# Patient Record
Sex: Female | Born: 1965 | Race: White | Hispanic: No | Marital: Married | Smoking: Former smoker
Health system: Southern US, Community
[De-identification: ages and names within clinical notes are randomized; demographics above are authoritative.]

## PROBLEM LIST (undated history)

## (undated) DIAGNOSIS — J189 Pneumonia, unspecified organism: Secondary | ICD-10-CM

## (undated) DIAGNOSIS — Z862 Personal history of diseases of the blood and blood-forming organs and certain disorders involving the immune mechanism: Secondary | ICD-10-CM

## (undated) DIAGNOSIS — C541 Malignant neoplasm of endometrium: Secondary | ICD-10-CM

## (undated) HISTORY — PX: MOUTH SURGERY: SHX715

## (undated) HISTORY — PX: COLONOSCOPY: SHX174

---

## 2002-07-23 ENCOUNTER — Other Ambulatory Visit: Admission: RE | Admit: 2002-07-23 | Discharge: 2002-07-23 | Payer: Self-pay | Admitting: Obstetrics and Gynecology

## 2002-07-30 ENCOUNTER — Encounter: Admission: RE | Admit: 2002-07-30 | Discharge: 2002-07-30 | Payer: Self-pay | Admitting: Obstetrics and Gynecology

## 2002-07-30 ENCOUNTER — Encounter: Payer: Self-pay | Admitting: Obstetrics and Gynecology

## 2002-07-30 ENCOUNTER — Ambulatory Visit (HOSPITAL_COMMUNITY): Admission: RE | Admit: 2002-07-30 | Discharge: 2002-07-30 | Payer: Self-pay | Admitting: Obstetrics and Gynecology

## 2006-08-31 ENCOUNTER — Ambulatory Visit: Payer: Self-pay | Admitting: General Practice

## 2007-09-05 ENCOUNTER — Ambulatory Visit: Payer: Self-pay | Admitting: General Practice

## 2008-09-10 ENCOUNTER — Ambulatory Visit: Payer: Self-pay | Admitting: General Practice

## 2009-09-16 ENCOUNTER — Ambulatory Visit: Payer: Self-pay | Admitting: General Practice

## 2010-09-08 ENCOUNTER — Ambulatory Visit: Payer: Self-pay | Admitting: General Practice

## 2011-09-07 ENCOUNTER — Ambulatory Visit: Payer: Self-pay

## 2012-09-05 ENCOUNTER — Ambulatory Visit: Payer: Self-pay

## 2013-09-12 ENCOUNTER — Ambulatory Visit: Payer: Self-pay | Admitting: General Practice

## 2014-05-20 DIAGNOSIS — E6609 Other obesity due to excess calories: Secondary | ICD-10-CM | POA: Insufficient documentation

## 2014-05-20 DIAGNOSIS — Z0000 Encounter for general adult medical examination without abnormal findings: Secondary | ICD-10-CM | POA: Insufficient documentation

## 2014-05-20 DIAGNOSIS — Z6838 Body mass index (BMI) 38.0-38.9, adult: Secondary | ICD-10-CM

## 2014-05-20 DIAGNOSIS — L309 Dermatitis, unspecified: Secondary | ICD-10-CM | POA: Insufficient documentation

## 2015-08-13 ENCOUNTER — Other Ambulatory Visit: Payer: Self-pay | Admitting: Family Medicine

## 2015-08-13 ENCOUNTER — Other Ambulatory Visit: Payer: Self-pay

## 2015-08-14 LAB — CMP12+LP+TP+TSH+6AC+CBC/D/PLT
ALT: 11 IU/L (ref 0–32)
AST: 22 IU/L (ref 0–40)
Albumin/Globulin Ratio: 1.6 (ref 1.1–2.5)
Albumin: 4.5 g/dL (ref 3.5–5.5)
Alkaline Phosphatase: 57 IU/L (ref 39–117)
BUN/Creatinine Ratio: 15 (ref 9–23)
BUN: 11 mg/dL (ref 6–24)
Basophils Absolute: 0 10*3/uL (ref 0.0–0.2)
Basos: 1 %
Bilirubin Total: 0.7 mg/dL (ref 0.0–1.2)
CALCIUM: 9.7 mg/dL (ref 8.7–10.2)
CHOL/HDL RATIO: 3.2 ratio (ref 0.0–4.4)
CREATININE: 0.73 mg/dL (ref 0.57–1.00)
Chloride: 102 mmol/L (ref 97–108)
Cholesterol, Total: 190 mg/dL (ref 100–199)
EOS (ABSOLUTE): 0.1 10*3/uL (ref 0.0–0.4)
Eos: 1 %
Estimated CHD Risk: <0.5 times avg. (ref 0.0–1.0)
Free Thyroxine Index: 2.6 (ref 1.2–4.9)
GFR calc Af Amer: 110 mL/min/{1.73_m2} (ref >59)
GFR, EST NON AFRICAN AMERICAN: 96 mL/min/{1.73_m2} (ref >59)
GGT: 16 IU/L (ref 0–60)
GLOBULIN, TOTAL: 2.8 g/dL (ref 1.5–4.5)
Glucose: 94 mg/dL (ref 65–99)
HDL: 59 mg/dL (ref >39)
Hematocrit: 41.3 % (ref 34.0–46.6)
Hemoglobin: 13.8 g/dL (ref 11.1–15.9)
IRON: 80 ug/dL (ref 27–159)
Immature Grans (Abs): 0 10*3/uL (ref 0.0–0.1)
Immature Granulocytes: 0 %
LDH: 199 IU/L (ref 119–226)
LDL Calculated: 112 mg/dL — ABNORMAL HIGH (ref 0–99)
LYMPHS ABS: 2.5 10*3/uL (ref 0.7–3.1)
Lymphs: 47 %
MCH: 30.5 pg (ref 26.6–33.0)
MCHC: 33.4 g/dL (ref 31.5–35.7)
MCV: 91 fL (ref 79–97)
MONOS ABS: 0.4 10*3/uL (ref 0.1–0.9)
Monocytes: 7 %
NEUTROS ABS: 2.3 10*3/uL (ref 1.4–7.0)
Neutrophils: 44 %
PHOSPHORUS: 3.7 mg/dL (ref 2.5–4.5)
POTASSIUM: 4.1 mmol/L (ref 3.5–5.2)
Platelets: 192 10*3/uL (ref 150–379)
RBC: 4.52 x10E6/uL (ref 3.77–5.28)
RDW: 14.2 % (ref 12.3–15.4)
Sodium: 140 mmol/L (ref 134–144)
T3 Uptake Ratio: 27 % (ref 24–39)
T4 TOTAL: 9.8 ug/dL (ref 4.5–12.0)
TOTAL PROTEIN: 7.3 g/dL (ref 6.0–8.5)
TRIGLYCERIDES: 97 mg/dL (ref 0–149)
TSH: 0.55 u[IU]/mL (ref 0.450–4.500)
URIC ACID: 5.2 mg/dL (ref 2.5–7.1)
VLDL Cholesterol Cal: 19 mg/dL (ref 5–40)
WBC: 5.2 10*3/uL (ref 3.4–10.8)

## 2015-08-14 LAB — HGB A1C W/O EAG: HEMOGLOBIN A1C: 5.4 % (ref 4.8–5.6)

## 2015-08-26 ENCOUNTER — Other Ambulatory Visit: Payer: Self-pay | Admitting: Internal Medicine

## 2015-08-26 DIAGNOSIS — Z1231 Encounter for screening mammogram for malignant neoplasm of breast: Secondary | ICD-10-CM

## 2015-09-02 ENCOUNTER — Ambulatory Visit
Admission: RE | Admit: 2015-09-02 | Discharge: 2015-09-02 | Disposition: A | Discharge status: Home / Self Care | Payer: No Typology Code available for payment source | Source: Ambulatory Visit | Attending: Internal Medicine | Admitting: Internal Medicine

## 2015-09-02 DIAGNOSIS — Z1231 Encounter for screening mammogram for malignant neoplasm of breast: Secondary | ICD-10-CM | POA: Insufficient documentation

## 2016-04-28 ENCOUNTER — Other Ambulatory Visit: Payer: Self-pay | Admitting: Family Medicine

## 2016-04-29 LAB — CMP12+LP+TP+TSH+6AC+CBC/D/PLT
A/G RATIO: 1.7 (ref 1.2–2.2)
ALK PHOS: 63 IU/L (ref 39–117)
ALT: 10 IU/L (ref 0–32)
AST: 27 IU/L (ref 0–40)
Albumin: 4.5 g/dL (ref 3.5–5.5)
BASOS ABS: 0 10*3/uL (ref 0.0–0.2)
BILIRUBIN TOTAL: 0.9 mg/dL (ref 0.0–1.2)
BUN/Creatinine Ratio: 14 (ref 9–23)
BUN: 11 mg/dL (ref 6–24)
Basos: 0 %
CHOL/HDL RATIO: 3.6 ratio (ref 0.0–4.4)
CREATININE: 0.76 mg/dL (ref 0.57–1.00)
Calcium: 9.8 mg/dL (ref 8.7–10.2)
Chloride: 101 mmol/L (ref 96–106)
Cholesterol, Total: 228 mg/dL — ABNORMAL HIGH (ref 100–199)
EOS (ABSOLUTE): 0.1 10*3/uL (ref 0.0–0.4)
Eos: 2 %
Estimated CHD Risk: 0.6 times avg. (ref 0.0–1.0)
Free Thyroxine Index: 2.4 (ref 1.2–4.9)
GFR calc Af Amer: 104 mL/min/{1.73_m2} (ref >59)
GFR, EST NON AFRICAN AMERICAN: 90 mL/min/{1.73_m2} (ref >59)
GGT: 20 IU/L (ref 0–60)
GLUCOSE: 93 mg/dL (ref 65–99)
Globulin, Total: 2.7 g/dL (ref 1.5–4.5)
HDL: 63 mg/dL (ref >39)
HEMOGLOBIN: 14.2 g/dL (ref 11.1–15.9)
Hematocrit: 42.1 % (ref 34.0–46.6)
Immature Grans (Abs): 0 10*3/uL (ref 0.0–0.1)
Immature Granulocytes: 0 %
Iron: 99 ug/dL (ref 27–159)
LDH: 208 IU/L (ref 119–226)
LDL CALC: 147 mg/dL — AB (ref 0–99)
LYMPHS ABS: 2 10*3/uL (ref 0.7–3.1)
LYMPHS: 43 %
MCH: 30.8 pg (ref 26.6–33.0)
MCHC: 33.7 g/dL (ref 31.5–35.7)
MCV: 91 fL (ref 79–97)
MONOCYTES: 8 %
MONOS ABS: 0.3 10*3/uL (ref 0.1–0.9)
NEUTROS ABS: 2.1 10*3/uL (ref 1.4–7.0)
Neutrophils: 47 %
PHOSPHORUS: 3.6 mg/dL (ref 2.5–4.5)
POTASSIUM: 4.2 mmol/L (ref 3.5–5.2)
Platelets: 220 10*3/uL (ref 150–379)
RBC: 4.61 x10E6/uL (ref 3.77–5.28)
RDW: 13.7 % (ref 12.3–15.4)
Sodium: 140 mmol/L (ref 134–144)
T3 Uptake Ratio: 27 % (ref 24–39)
T4, Total: 8.8 ug/dL (ref 4.5–12.0)
TSH: 0.51 u[IU]/mL (ref 0.450–4.500)
Total Protein: 7.2 g/dL (ref 6.0–8.5)
Triglycerides: 91 mg/dL (ref 0–149)
URIC ACID: 5.5 mg/dL (ref 2.5–7.1)
VLDL CHOLESTEROL CAL: 18 mg/dL (ref 5–40)
WBC: 4.6 10*3/uL (ref 3.4–10.8)

## 2016-04-29 LAB — VITAMIN D 25 HYDROXY (VIT D DEFICIENCY, FRACTURES): VIT D 25 HYDROXY: 30.5 ng/mL (ref 30.0–100.0)

## 2016-04-29 LAB — HGB A1C W/O EAG: HEMOGLOBIN A1C: 5.2 % (ref 4.8–5.6)

## 2016-08-20 ENCOUNTER — Other Ambulatory Visit: Payer: Self-pay | Admitting: Internal Medicine

## 2016-08-20 DIAGNOSIS — Z1231 Encounter for screening mammogram for malignant neoplasm of breast: Secondary | ICD-10-CM

## 2016-09-06 ENCOUNTER — Ambulatory Visit
Admission: RE | Admit: 2016-09-06 | Discharge: 2016-09-06 | Disposition: A | Discharge status: Home / Self Care | Payer: No Typology Code available for payment source | Source: Ambulatory Visit | Attending: Internal Medicine | Admitting: Internal Medicine

## 2016-09-06 ENCOUNTER — Encounter: Payer: Self-pay | Admitting: Radiology

## 2016-09-06 DIAGNOSIS — Z1231 Encounter for screening mammogram for malignant neoplasm of breast: Secondary | ICD-10-CM | POA: Insufficient documentation

## 2017-03-17 ENCOUNTER — Ambulatory Visit: Payer: Self-pay | Admitting: Registered Nurse

## 2017-03-17 VITALS — BP 120/87 | HR 81 | Temp 97.8°F

## 2017-03-17 DIAGNOSIS — M7652 Patellar tendinitis, left knee: Principal | ICD-10-CM

## 2017-03-17 DIAGNOSIS — M7651 Patellar tendinitis, right knee: Secondary | ICD-10-CM

## 2017-03-17 NOTE — Patient Instructions (Signed)
New shoes Strengthening exercises Weight loss Motrin 800mg  by mouth every 8 hours x 2 weeks Ice therapy prior to bed 15 minutes biofreeze apply up to every 6 hours over knees as needed for pain   Patellar Tendinitis Rehab Ask your health care provider which exercises are safe for you. Do exercises exactly as told by your health care provider and adjust them as directed. It is normal to feel mild stretching, pulling, tightness, or discomfort as you do these exercises, but you should stop right away if you feel sudden pain or your pain gets worse.Do not begin these exercises until told by your health care provider. Stretching and range of motion exercises This exercise warms up your muscles and joints and improves the movement and flexibility of your knee. This exercise also helps to relieve pain and stiffness. Exercise A: Hamstring, doorway   1. Lie on your back in front of a doorway with your __________ leg resting against the wall and your other leg flat on the floor in the doorway. There should be a slight bend in your __________ knee. 2. Straighten your __________ knee. You should feel a stretch behind your knee or thigh. If you do not, scoot your buttocks closer to the door. 3. Hold this position for __________ seconds. Repeat __________ times. Complete this stretch __________ times a day. Strengthening exercises These exercises build strength and endurance in your knee. Endurance is the ability to use your muscles for a long time, even after they get tired. Exercise B: Quadriceps, isometric   1. Lie on your back with your __________ leg extended and your other knee bent. 2. Slowly tense the muscles in the front of your __________ thigh. When you do this, you should see your kneecap slide up toward your hip or see increased dimpling just above the knee. This motion will push the back of your knee toward the floor. If this is painful, try putting a rolled-up hand towel under your knee to  support it in a bent position. Change the size of the towel to find a position that allows you to do this exercise without any pain. 3. For __________ seconds, hold the muscle as tight as you can without increasing your pain. 4. Relax the muscles slowly and completely. Repeat __________ times. Complete this exercise __________ times a day. Exercise C: Straight leg raises (  quadriceps) 1. Lie on your back with your __________ leg extended and your other knee bent. 2. Tense the muscles in the front of your __________ thigh. When you do this, you should see your kneecap slide up or see increased dimpling just above the knee. 3. Keep these muscles tight as you raise your leg 4-6 inches (10-15 cm) off the floor. Do not let your moving knee bend. 4. Hold this position for __________ seconds. 5. Keep these muscles tense as you slowly lower your leg. 6. Relax your muscles slowly and completely. Repeat __________ times. Complete this exercise __________ times a day. Exercise D: Squats 1. Stand in front of a table, with your feet and knees pointing straight ahead. You may rest your hands on the table for balance but not for support. 2. Slowly bend your knees and lower your hips like you are going to sit in a chair.  Keep your weight over your heels, not over your toes.  Keep your lower legs upright so they are parallel with the table legs.  Do not let your hips go lower than your knees.  Do not  bend lower than told by your health care provider.  If your knee pain increases, do not bend as low. 3. Hold the squat position for __________ seconds. 4. Slowly push with your legs to return to standing. Do not use your hands to pull yourself to standing. Repeat __________ times. Complete this exercise __________ times a day. Exercise E: Step-downs 1. Stand on the edge of a step. 2. Keeping your weight over your __________ heel, slowly bend your __________ knee to bring your __________ heel toward the  floor. Lower your heel as far as you can while keeping control and without increasing any discomfort.  Do not let your __________ knee come forward.  Use your leg muscles, not gravity, to lower your body.  Hold a wall or rail for balance if needed. 3. Slowly push through your heel to lift your body weight back up. 4. Return to the starting position. Repeat __________ times. Complete this exercise __________ times a day. Exercise F: Straight leg raises ( hip abductors) 1. Lie on your side with your __________ leg in the top position. Lie so your head, shoulder, knee, and hip line up. You may bend your lower knee to help you keep your balance. 2. Roll your hips slightly forward, so that your hips are stacked directly over each other and your __________ knee is facing forward. 3. Leading with your heel, lift your top leg 4-6 inches (10-15 cm). You should feel the muscles in your outer hip lifting.  Do not let your foot drift forward.  Do not let your knee roll toward the ceiling. 4. Hold this position for __________ seconds. 5. Slowly lower your leg to the starting position. 6. Let your muscles relax completely after each repetition. Repeat __________ times. Complete this exercise __________ times a day. This information is not intended to replace advice given to you by your health care provider. Make sure you discuss any questions you have with your health care provider. Document Released: 11/01/2005 Document Revised: 07/08/2016 Document Reviewed: 08/05/2015 Elsevier Interactive Patient Education  2017 [employer]  Tendinitis Tendinitis is inflammation of a tendon. A tendon is a strong cord of tissue that connects muscle to bone. Tendinitis can affect any tendon, but it most commonly affects the shoulder tendon (rotator cuff), ankle tendon (Achilles tendon), elbow tendon (triceps tendon), or one of the tendons in the wrist. What are the causes? This condition may be caused  by:  Overusing a tendon or muscle. This is common.  Age-related wear and tear.  Injury.  Inflammatory conditions, such as arthritis.  Certain medicines. What increases the risk? This condition is more likely to develop in people who do activities that involve repetitive motions. What are the signs or symptoms? Symptoms of this condition may include:  Pain.  Tenderness.  Mild swelling. How is this diagnosed? This condition is diagnosed with a physical exam. You may also have tests, such as:  Ultrasound. This uses sound waves to make an image of your affected area.  MRI. How is this treated? This condition may be treated by resting, icing, applying pressure (compression), and raising (elevating) the area above the level of your heart. This is known as RICE therapy. Treatment may also include:  Medicines to help reduce inflammation or to help reduce pain.  Exercises or physical therapy to strengthen and stretch the tendon.  A brace or splint.  Surgery (rare). Follow these instructions at home:   If you have a splint or brace:   Wear the  splint or brace as told by your health care provider. Remove it only as told by your health care provider.  Loosen the splint or brace if your fingers or toes tingle, become numb, or turn cold and blue.  Do not take baths, swim, or use a hot tub until your health care provider approves. Ask your health care provider if you can take showers. You may only be allowed to take sponge baths for bathing.  Do not let your splint or brace get wet if it is not waterproof.  If your splint or brace is not waterproof, cover it with a watertight plastic bag when you take a bath or a shower.  Keep the splint or brace clean. Managing pain, stiffness, and swelling   If directed, apply ice to the affected area.  Put ice in a plastic bag.  Place a towel between your skin and the bag.  Leave the ice on for 20 minutes, 2-3 times a day.  If  directed, apply heat to the affected area as often as told by your health care provider. Use the heat source that your health care provider recommends, such as a moist heat pack or a heating pad.  Place a towel between your skin and the heat source.  Leave the heat on for 20-30 minutes.  Remove the heat if your skin turns bright red. This is especially important if you are unable to feel pain, heat, or cold. You may have a greater risk of getting burned.  Move the fingers or toes of the affected limb often, if this applies. This can help to prevent stiffness and lessen swelling.  If directed, elevate the affected area above the level of your heart while you are sitting or lying down. Driving   Do not drive or operate heavy machinery while taking prescription pain medicine.  Ask your health care provider when it is safe to drive if you have a splint or brace on any part of your arm or leg. Activity   Return to your normal activities as told by your health care provider. Ask your health care provider what activities are safe for you.  Rest the affected area as told by your health care provider.  Avoid using the affected area while you are experiencing symptoms of tendinitis.  Do exercises as told by your health care provider. General instructions   If you have a splint, do not put pressure on any part of the splint until it is fully hardened. This may take several hours.  Wear an elastic bandage or compression wrap only as told by your health care provider.  Take over-the-counter and prescription medicines only as told by your health care provider.  Keep all follow-up visits as told by your health care provider. This is important. Contact a health care provider if:  Your symptoms do not improve.  You develop new, unexplained problems, such as numbness in your hands. This information is not intended to replace advice given to you by your health care provider. Make sure you discuss  any questions you have with your health care provider. Document Released: 10/29/2000 Document Revised: 07/01/2016 Document Reviewed: 08/04/2015 Elsevier Interactive Patient Education  2017 [employer].

## 2017-03-17 NOTE — Progress Notes (Signed)
Subjective:    Patient ID: Sara Cook, female    DOB: 1966-10-05, 51 y.o.   MRN: [id number]  50y/o caucasian female reports bil knee pain R>L. Has been hurting intermittently for about 3 years. More severe pain uncontrolled with Ibuprofen x1 week. 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 Worst pain is on the lateral aspect of R knee. Sometimes noticed popping denied locking and giving out.  Works two jobs.  Current shoes 53 years old rotates footwear.  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  Does not kneel for job.  Has had recent weight loss a few pounds trying to lose weight.  Worst pain at night  Maybe taking motrin once a day but not every day.  Ran out needs refill.      Review of Systems  Constitutional: Negative for chills and fever.  HENT: Negative for congestion, ear pain and sore throat.   Eyes: Negative for pain and discharge.  Respiratory: Negative for cough and wheezing.   Cardiovascular: Negative for chest pain and leg swelling.  Gastrointestinal: Negative for blood in stool, constipation, diarrhea, nausea and vomiting.  Genitourinary: Negative for difficulty urinating, dysuria and hematuria.  Musculoskeletal: Positive for arthralgias and myalgias. Negative for back pain, gait problem, joint swelling, neck pain and neck stiffness.  Skin: Negative for color change, pallor, rash and wound.  Allergic/Immunologic: Negative for environmental allergies and food allergies.  Neurological: Negative for headaches.  Hematological: Negative for adenopathy. Does not bruise/bleed easily.  Psychiatric/Behavioral: Positive for sleep disturbance. Negative for agitation and confusion. The patient is not nervous/anxious.        Objective:   Physical Exam  Constitutional: She is oriented to person, place, and time. Vital signs are normal. She appears well-developed  and well-nourished. She is cooperative.  Non-toxic appearance. She does not have a sickly appearance. She does not appear ill. No distress.  HENT:  Head: Normocephalic and atraumatic.  Right Ear: Hearing and external ear normal.  Left Ear: Hearing and external ear normal.  Nose: Nose normal.  Mouth/Throat: Uvula is midline, oropharynx is clear and moist and mucous membranes are normal. No oropharyngeal exudate.  Eyes: Conjunctivae, EOM and lids are normal. Pupils are equal, round, and reactive to light. Right eye exhibits no discharge. Left eye exhibits no discharge. No scleral icterus.  Neck: Trachea normal and normal range of motion. Neck supple. No tracheal deviation present.  Cardiovascular: Normal rate, regular rhythm, normal heart sounds and intact distal pulses.   Pulses:      Dorsalis pedis pulses are 2+ on the right side, and 2+ on the left side.  Pulmonary/Chest: Effort normal and breath sounds normal. No stridor. No respiratory distress. She has no wheezes.  Abdominal: Soft.  Musculoskeletal: Normal range of motion. She exhibits tenderness. She exhibits no edema or deformity.       Right shoulder: Normal.       Left shoulder: Normal.       Right elbow: Normal.      Left elbow: Normal.       Right hip: Normal.       Left hip: Normal.       Right knee: She exhibits normal range of motion, no swelling, no effusion, no ecchymosis, no deformity, no laceration, no erythema, normal alignment, no LCL laxity, normal patellar mobility, no bony tenderness, normal meniscus  and no MCL laxity. Tenderness found. Patellar tendon tenderness noted.       Left knee: She exhibits normal range of motion, no swelling, no effusion, no ecchymosis, no deformity, no laceration, no erythema, normal alignment, no LCL laxity, normal patellar mobility, no bony tenderness, normal meniscus and no MCL laxity. No tenderness found. No medial joint line, no lateral joint line, no MCL, no LCL and no patellar tendon  tenderness noted.       Right ankle: Normal.       Left ankle: Normal.       Cervical back: Normal.       Thoracic back: Normal.       Lumbar back: Normal.       Right hand: Normal.       Left hand: Normal.       Right lower leg: Normal.       Left lower leg: Normal.  Crepitus bilateral knees with arom; intermittent popping left with passive AROM; negative valgus/varus stress, anterior/posterior drawer sign; lachman/mcmurray's tests; right lateral patellar tendon TTP; negative patellar apprehension bilaterally  Neurological: She is alert and oriented to person, place, and time. She has normal strength. She is not disoriented. She displays no atrophy and no tremor. No cranial nerve deficit. She exhibits normal muscle tone. She displays no seizure activity. Coordination and gait normal. GCS eye subscore is 4. GCS verbal subscore is 5. GCS motor subscore is 6.  Gait sure and steady in exam room; on/off table and in/out chair without difficulty; treads worn on shoes bilaterally  Skin: Skin is warm, dry and intact. No abrasion, no bruising, no burn, no ecchymosis, no laceration, no lesion, no petechiae and no rash noted. She is not diaphoretic. No cyanosis or erythema. No pallor. Nails show no clubbing.  Psychiatric: She has a normal mood and affect. Her speech is normal and behavior is normal. Judgment and thought content normal. Cognition and memory are normal.  Nursing note and vitals reviewed.         Assessment & Plan:  A-bilateral patellar tendonitis  P-refilled motrin 800mg  po TID x 2 weeks #30 RF0 distributed to patient from [facility] packages from clinic stock apply QID prn pain.  Elevate/ice at least 15 minutes daily encouraged TID.  Demonstrated stretches and leg strengthening exercises to patient in exam room.  Handout patellar rehab exercises tendonitis.  Discussed replacing shoes every 4 months and rotating footwear not wearing shoes same pair daily. Patient was  instructed to rest, ice and elevate leg.  Medications as directed.  Patient may take NSAIDS as needed choose only 1 formula advil/aleve/motrin/naproxen. Discussed weight loss.  Ice pack given to patient from clinic stock  Call or return to clinic as needed if these symptoms worsen or fail to improve as anticipated.  Patient verbalized agreement and understanding of treatment plan.   P2:  ROM exercises, Stretching, and Hand out given

## 2017-04-05 ENCOUNTER — Ambulatory Visit: Payer: Self-pay | Admitting: *Deleted

## 2017-04-05 VITALS — BP 128/86 | Ht 68.0 in | Wt 240.0 lb

## 2017-04-05 DIAGNOSIS — Z0000 Encounter for general adult medical examination without abnormal findings: Secondary | ICD-10-CM

## 2017-04-05 NOTE — Progress Notes (Addendum)
[employer] insurance premium discount evaluation: Labs Drawn. Only Lipid Panel and A1c drawn. Remainder of labs drawn at PCP (under Care Everywhere) in March 2018. [employer] ROI form signed. Tobacco Free Attestation form signed.  Forms placed in paper chart.

## 2017-04-05 NOTE — Addendum Note (Signed)
Addended by: Beckie Busing on: 04/05/2017 09:41 AM   Modules accepted: Orders

## 2017-04-06 LAB — LIPID PANEL
CHOL/HDL RATIO: 3.2 ratio (ref 0.0–4.4)
CHOLESTEROL TOTAL: 211 mg/dL — AB (ref 100–199)
HDL: 66 mg/dL (ref >39)
LDL CALC: 127 mg/dL — AB (ref 0–99)
Triglycerides: 90 mg/dL (ref 0–149)
VLDL CHOLESTEROL CAL: 18 mg/dL (ref 5–40)

## 2017-04-06 LAB — HGB A1C W/O EAG: Hgb A1c MFr Bld: 5.4 % (ref 4.8–5.6)

## 2017-04-08 NOTE — Progress Notes (Signed)
Results reviewed. Copy given to pt. Results routed to PCP Idamae Schuller, [facility].

## 2017-04-12 ENCOUNTER — Ambulatory Visit: Payer: Self-pay | Admitting: Registered Nurse

## 2017-04-12 VITALS — BP 106/91 | HR 102 | Temp 97.4°F

## 2017-04-12 DIAGNOSIS — M79675 Pain in left toe(s): Secondary | ICD-10-CM

## 2017-04-12 MED ORDER — IBUPROFEN 800 MG PO TABS: 800.0000 mg | ORAL_TABLET | Freq: Three times a day (TID) | ORAL | 0 refills | Status: AC | PRN

## 2017-04-12 NOTE — Patient Instructions (Addendum)
Motrin 800mg  by mouth every 8 hours as needed for pain with food biofreeze as needed four times per day topical affected area Ice 15 minutes 4 times per day Elevate foot when sitting May buddy tape toes with cobain supplied from clinic daily Wear supportive footwear firm sole Follow up re-evaluation worsening swelling/pain/bruising   Contusion A contusion is a deep bruise. Contusions are the result of a blunt injury to tissues and muscle fibers under the skin. The injury causes bleeding under the skin. The skin overlying the contusion may turn blue, purple, or yellow. Minor injuries will give you a painless contusion, but more severe contusions may stay painful and swollen for a few weeks. What are the causes? This condition is usually caused by a blow, trauma, or direct force to an area of the body. What are the signs or symptoms? Symptoms of this condition include:  Swelling of the injured area.  Pain and tenderness in the injured area.  Discoloration. The area may have redness and then turn blue, purple, or yellow. How is this diagnosed? This condition is diagnosed based on a physical exam and medical history. An X-ray, CT scan, or MRI may be needed to determine if there are any associated injuries, such as broken bones (fractures). How is this treated? Specific treatment for this condition depends on what area of the body was injured. In general, the best treatment for a contusion is resting, icing, applying pressure to (compression), and elevating the injured area. This is often called the RICE strategy. Over-the-counter anti-inflammatory medicines may also be recommended for pain control. Follow these instructions at home:  Rest the injured area.  If directed, apply ice to the injured area:  Put ice in a plastic bag.  Place a towel between your skin and the bag.  Leave the ice on for 20 minutes, 2-3 times per day.  If directed, apply light compression to the injured area  using an elastic bandage. Make sure the bandage is not wrapped too tightly. Remove and reapply the bandage as directed by your health care provider.  If possible, raise (elevate) the injured area above the level of your heart while you are sitting or lying down.  Take over-the-counter and prescription medicines only as told by your health care provider. Contact a health care provider if:  Your symptoms do not improve after several days of treatment.  Your symptoms get worse.  You have difficulty moving the injured area. Get help right away if:  You have severe pain.  You have numbness in a hand or foot.  Your hand or foot turns pale or cold. This information is not intended to replace advice given to you by your health care provider. Make sure you discuss any questions you have with your health care provider. Document Released: 08/11/2005 Document Revised: 03/11/2016 Document Reviewed: 03/19/2015 Elsevier Interactive Patient Education  2017 [employer]  Toe Fracture A toe fracture is a break in one of the toe bones (phalanges). What are the causes? This condition may be caused by:  Dropping a heavy object on your toe.  Stubbing your toe.  Overusing your toe or doing repetitive exercise.  Twisting or stretching your toe out of place. What increases the risk? This condition is more likely to develop in people who:  Play contact sports.  Have a bone disease.  Have a low calcium level. What are the signs or symptoms? The main symptoms of this condition are swelling and pain in the toe. The pain  may get worse with standing or walking. Other symptoms include:  Bruising.  Stiffness.  Numbness.  A change in the way the toe looks.  Broken bones that poke through the skin.  Blood beneath the toenail. How is this diagnosed? This condition is diagnosed with a physical exam. You may also have X-rays. How is this treated? Treatment for this condition depends on the  type of fracture and its severity. Treatment may involve:  Taping the broken toe to a toe that is next to it (buddy taping). This is the most common treatment for fractures in which the bone has not moved out of place (nondisplaced fracture).  Wearing a shoe that has a wide, rigid sole to protect the toe and to limit its movement.  Wearing a walking cast.  Having a procedure to move the toe back into place.  Surgery. This may be needed:  If there are many pieces of broken bone that are out of place (displaced).  If the toe joint breaks.  If the bone breaks through the skin.  Physical therapy. This is done to help regain movement and strength in the toe. You may need follow-up X-rays to make sure that the bone is healing well and staying in position. Follow these instructions at home: If you have a cast:   Do not stick anything inside the cast to scratch your skin. Doing that increases your risk of infection.  Check the skin around the cast every day. Report any concerns to your health care provider. You may put lotion on dry skin around the edges of the cast. Do not apply lotion to the skin underneath the cast.  Do not put pressure on any part of the cast until it is fully hardened. This may take several hours.  Keep the cast clean and dry. Bathing   Do not take baths, swim, or use a hot tub until your health care provider approves. Ask your health care provider if you can take showers. You may only be allowed to take sponge baths for bathing.  If your health care provider approves bathing and showering, cover the cast or bandage (dressing) with a watertight plastic bag to protect it from water. Do not let the cast or dressing get wet. Managing pain, stiffness, and swelling   If you do not have a cast, apply ice to the injured area, if directed.  Put ice in a plastic bag.  Place a towel between your skin and the bag.  Leave the ice on for 20 minutes, 2-3 times per  day.  Move your toes often to avoid stiffness and to lessen swelling.  Raise (elevate) the injured area above the level of your heart while you are sitting or lying down. Driving   Do not drive or operate heavy machinery while taking pain medicine.  Do not drive while wearing a cast on a foot that you use for driving. Activity   Return to your normal activities as directed by your health care provider. Ask your health care provider what activities are safe for you.  Perform exercises daily as directed by your health care provider or physical therapist. Safety   Do not use the injured limb to support your body weight until your health care provider says that you can. Use crutches or other assistive devices as directed by your health care provider. General instructions   If your toe was treated with buddy taping, follow your health care provider's instructions for changing the gauze and tape.  Change it more often:  The gauze and tape get wet. If this happens, dry the space between the toes.  The gauze and tape are too tight and cause your toe to become pale or numb.  Wear a protective shoe as directed by your health care provider. If you were not given a protective shoe, wear sturdy, supportive shoes. Your shoes should not pinch your toes and should not fit tightly against your toes.  Do not use any tobacco products, including cigarettes, chewing tobacco, or e-cigarettes. Tobacco can delay bone healing. If you need help quitting, ask your health care provider.  Take medicines only as directed by your health care provider.  Keep all follow-up visits as directed by your health care provider. This is important. Contact a health care provider if:  You have a fever.  Your pain medicine is not helping.  Your toe is cold.  Your toe is numb.  You still have pain after one week of rest and treatment.  You still have pain after your health care provider has said that you can start  walking again.  You have pain, tingling, or numbness in your foot that is not going away. Get help right away if:  You have severe pain.  You have redness or inflammation in your toe that is getting worse.  You have pain or numbness in your toe that is getting worse.  Your toe turns blue. This information is not intended to replace advice given to you by your health care provider. Make sure you discuss any questions you have with your health care provider. Document Released: 10/29/2000 Document Revised: 07/05/2016 Document Reviewed: 08/28/2014 Elsevier Interactive Patient Education  2017 [employer].

## 2017-04-12 NOTE — Progress Notes (Signed)
Subjective:    Patient ID: Sara Cook, female    DOB: 1966-01-30, 51 y.o.   MRN: [id number]  51y/o caucasian female stubbed toes left foot on door jam/sill had mild pain and swelling but was off work for holiday weekend until today.  Walking/standing for past 2 hours at work worsening pain left 3rd/4th toes.  Took motrin at home once a day this weekend and elevated foot/rested as she was reading a good book and relaxing all weekend.      Review of Systems  Constitutional: Negative for chills, fatigue and fever.  HENT: Negative for congestion, ear pain and sore throat.   Eyes: Negative for pain and discharge.  Respiratory: Negative for cough and wheezing.   Cardiovascular: Negative for chest pain and leg swelling.  Gastrointestinal: Negative for blood in stool, constipation, diarrhea, nausea and vomiting.  Genitourinary: Negative for difficulty urinating, dysuria and hematuria.  Musculoskeletal: Positive for arthralgias, gait problem, joint swelling and myalgias. Negative for back pain, neck pain and neck stiffness.  Skin: Positive for color change. Negative for rash.  Allergic/Immunologic: Negative for environmental allergies and food allergies.  Neurological: Negative for tremors, weakness, numbness and headaches.  Hematological: Negative for adenopathy. Does not bruise/bleed easily.  Psychiatric/Behavioral: Negative for agitation and confusion. The patient is not nervous/anxious.        Objective:   Physical Exam  Constitutional: She appears well-developed and well-nourished. She is active and cooperative.  Non-toxic appearance. She does not have a sickly appearance. She does not appear ill. No distress.  HENT:  Head: Normocephalic and atraumatic.  Right Ear: Hearing and external ear normal.  Left Ear: Hearing and external ear normal.  Nose: Nose normal.  Mouth/Throat: Uvula is midline and mucous membranes are normal. No oropharyngeal exudate.  Eyes: Conjunctivae, EOM and  lids are normal. Pupils are equal, round, and reactive to light. Right eye exhibits no discharge. Left eye exhibits no discharge. No scleral icterus.  Neck: Trachea normal, normal range of motion and phonation normal. Neck supple. No neck rigidity. No tracheal deviation, no edema, no erythema and normal range of motion present.  Cardiovascular: Normal rate, regular rhythm, normal heart sounds and intact distal pulses.   Pulses:      Dorsalis pedis pulses are 2+ on the left side.       Posterior tibial pulses are 2+ on the left side.  Pulmonary/Chest: Effort normal and breath sounds normal. No stridor. She has no wheezes.  Abdominal: Soft.  Musculoskeletal: Normal range of motion. She exhibits edema and tenderness.       Right shoulder: Normal.       Left shoulder: Normal.       Right elbow: Normal.      Left elbow: Normal.       Right hip: Normal.       Left hip: Normal.       Right ankle: Normal.       Left ankle: Normal.       Cervical back: Normal.       Right hand: Normal.       Left hand: Normal.       Right lower leg: Normal.       Left lower leg: Normal.       Right foot: Normal.       Left foot: There is tenderness, bony tenderness and swelling. There is normal range of motion, normal capillary refill, no crepitus, no deformity and no laceration.  Feet:  Neurological: She is alert. She has normal strength. She is not disoriented. She displays no atrophy and no tremor. No cranial nerve deficit or sensory deficit. She exhibits normal muscle tone. She displays no seizure activity. Gait abnormal. Coordination normal. GCS eye subscore is 4. GCS verbal subscore is 5. GCS motor subscore is 6.  Favoring left foot slight limp in hallway  Skin: Skin is warm, dry and intact. No abrasion, no bruising, no burn, no ecchymosis, no laceration, no lesion, no petechiae and no rash noted. She is not diaphoretic. There is erythema. No cyanosis. No pallor. Nails show no clubbing.  3rd and 4th  proximal tarsal and MTP joint very mild increased temperature; patient wearing tennis shoes in clinic; in/out of chair without difficulty  Psychiatric: She has a normal mood and affect. Her speech is normal and behavior is normal. Judgment and thought content normal. Cognition and memory are normal.  Nursing note and vitals reviewed.         Assessment & Plan:  A-left third/fourth toe and MTP joint pain acute; elevated blood pressure  P- Patient reported pain mild, will increase BP will follow up for repeat BP on painfree day.  Discussed with patient if nondisplaced toe fracture buddy taping is treatment with hard soled shoe and 4-6 weeks for healing.  Recommended multivitamin with calcium and vitamin D once a day if fracture found.  Ice/elevate/rest extremity when sitting.  Avoid high impact activities.  Motrin 800mg  po TID prn pain take with food #30 RF0 dispensed from [facility] to patient.  Given 1 instant nonreusable ice pack for use at work on break.  Has reusable ice pack at home for daily use before/after work.  1 roll cobain 1 inch tape given to patient for buddy taping from clinic stock.  Elevate foot when seated at work and wear supportive shoes firm sole.   Return to clinic for re-evaluation if worsening of symptoms or no improvement with plan of care.  Discussed with patient recommended xray she wants to use [facility] as cheaper than [facility] for imaging with her insurance.  Discussed with patient I cannot order xray from that facility as not credentialed with them.  She would need to be evaluated by one of their providers. Discussed with patient if complex or fracture extended into joint line recommend podiatry referral.  Patient verbalized understanding information/instructions and had no further questions at this time.

## 2017-07-14 ENCOUNTER — Ambulatory Visit: Payer: Self-pay | Admitting: Registered Nurse

## 2017-07-14 VITALS — BP 123/88 | HR 73 | Temp 98.6°F

## 2017-07-14 DIAGNOSIS — H6591 Unspecified nonsuppurative otitis media, right ear: Secondary | ICD-10-CM

## 2017-07-14 MED ORDER — LORATADINE 10 MG PO TABS: 10.0000 mg | ORAL_TABLET | Freq: Every day | ORAL | 11 refills | Status: DC

## 2017-07-14 MED ORDER — AMOXICILLIN-POT CLAVULANATE 875-125 MG PO TABS: 1.0000 | ORAL_TABLET | Freq: Two times a day (BID) | ORAL | 0 refills | Status: AC

## 2017-07-14 NOTE — Progress Notes (Signed)
Subjective:    Patient ID: Sara Cook, female    DOB: 06-08-1966, 51 y.o.   MRN: [id number]  51y/o caucasian female established patient took motrin helped a little but recurrent right ear pain x 48 hours; denied seasonal allergies; cannot tolerate nose sprays  Pt c/o R ear pain x2 days. Pain extends down in to R neck.  Started at work on Tuesday intermittent stabbing motrin helps a little but doesn't resolve pain.   Denies drainage, muffled sounds.       Review of Systems  Constitutional: Negative for activity change, appetite change, chills, diaphoresis, fatigue, fever and unexpected weight change.  HENT: Positive for ear pain. Negative for dental problem, drooling, ear discharge, facial swelling, hearing loss, mouth sores, nosebleeds, postnasal drip, rhinorrhea, sinus pain, sinus pressure, sneezing, sore throat, tinnitus, trouble swallowing and voice change.   Eyes: Negative for photophobia, pain, discharge, redness, itching and visual disturbance.  Respiratory: Negative for cough, choking, chest tightness, shortness of breath, wheezing and stridor.   Cardiovascular: Negative for chest pain, palpitations and leg swelling.  Gastrointestinal: Negative for abdominal distention, abdominal pain, blood in stool, constipation, diarrhea, nausea and vomiting.  Endocrine: Negative for cold intolerance and heat intolerance.  Genitourinary: Negative for difficulty urinating, dysuria and hematuria.  Musculoskeletal: Negative for arthralgias, back pain, gait problem, joint swelling, myalgias, neck pain and neck stiffness.  Skin: Negative for color change, pallor, rash and wound.  Allergic/Immunologic: Negative for food allergies.  Neurological: Negative for dizziness, tremors, seizures, syncope, facial asymmetry, speech difficulty, weakness, light-headedness, numbness and headaches.  Hematological: Negative for adenopathy. Does not bruise/bleed easily.  Psychiatric/Behavioral: Negative for  agitation, behavioral problems, confusion and sleep disturbance.       Objective:   Physical Exam  Constitutional: She is oriented to person, place, and time. Vital signs are normal. She appears well-developed and well-nourished. She is active and cooperative.  Non-toxic appearance. She does not have a sickly appearance. She does not appear ill. No distress.  HENT:  Head: Normocephalic and atraumatic.  Right Ear: Hearing, external ear and ear canal normal. Tympanic membrane is injected, erythematous and bulging. A middle ear effusion is present.  Left Ear: Hearing, external ear and ear canal normal. A middle ear effusion is present.  Nose: Mucosal edema and rhinorrhea present. No nose lacerations, sinus tenderness, nasal deformity, septal deviation or nasal septal hematoma. No epistaxis.  No foreign bodies. Right sinus exhibits no maxillary sinus tenderness and no frontal sinus tenderness. Left sinus exhibits no maxillary sinus tenderness and no frontal sinus tenderness.  Mouth/Throat: Uvula is midline and mucous membranes are normal. Mucous membranes are not pale, not dry and not cyanotic. She does not have dentures. No oral lesions. No trismus in the jaw. Normal dentition. No dental abscesses, uvula swelling, lacerations or dental caries. Posterior oropharyngeal edema and posterior oropharyngeal erythema present. No oropharyngeal exudate or tonsillar abscesses.  Bilateral allergic shiners; nasal turbinates edema/erythema clear discharge; cobblestoning posterior pharynx; right TM air fluid level clear TM erythematous/injection 9pm-1am; left tm air fluid level clear  Eyes: Pupils are equal, round, and reactive to light. Conjunctivae, EOM and lids are normal. Right eye exhibits no chemosis, no discharge, no exudate and no hordeolum. No foreign body present in the right eye. Left eye exhibits no chemosis, no discharge, no exudate and no hordeolum. No foreign body present in the left eye. Right  conjunctiva is not injected. Right conjunctiva has no hemorrhage. Left conjunctiva is not injected. Left conjunctiva has no hemorrhage. No scleral  icterus. Right eye exhibits normal extraocular motion and no nystagmus. Left eye exhibits normal extraocular motion and no nystagmus. Right pupil is round and reactive. Left pupil is round and reactive. Pupils are equal.  Neck: Trachea normal, normal range of motion and phonation normal. Neck supple. No tracheal tenderness, no spinous process tenderness and no muscular tenderness present. No neck rigidity. No tracheal deviation, no edema, no erythema and normal range of motion present. No thyroid mass and no thyromegaly present.  Cardiovascular: Normal rate, regular rhythm, S1 normal, S2 normal, normal heart sounds and intact distal pulses.  PMI is not displaced.  Exam reveals no gallop and no friction rub.   No murmur heard. Pulmonary/Chest: Effort normal and breath sounds normal. No accessory muscle usage or stridor. No respiratory distress. She has no decreased breath sounds. She has no wheezes. She has no rhonchi. She has no rales. She exhibits no tenderness.  Spoke full sentences without difficulty; cough not observed in exam room  Abdominal: Soft. Normal appearance. She exhibits no distension. There is no rigidity and no guarding.  Musculoskeletal: Normal range of motion. She exhibits no edema or tenderness.       Right shoulder: Normal.       Left shoulder: Normal.       Right elbow: Normal.      Left elbow: Normal.       Right hip: Normal.       Left hip: Normal.       Right knee: Normal.       Left knee: Normal.       Cervical back: Normal.       Right hand: Normal.       Left hand: Normal.  Lymphadenopathy:       Head (right side): No submental, no submandibular, no tonsillar, no preauricular, no posterior auricular and no occipital adenopathy present.       Head (left side): No submental, no submandibular, no tonsillar, no preauricular, no  posterior auricular and no occipital adenopathy present.    She has no cervical adenopathy.       Right cervical: No superficial cervical, no deep cervical and no posterior cervical adenopathy present.      Left cervical: No superficial cervical, no deep cervical and no posterior cervical adenopathy present.  Neurological: She is alert and oriented to person, place, and time. She has normal strength. She is not disoriented. She displays no atrophy and no tremor. No cranial nerve deficit or sensory deficit. She exhibits normal muscle tone. She displays no seizure activity. Coordination and gait normal. GCS eye subscore is 4. GCS verbal subscore is 5. GCS motor subscore is 6.  On/off exam table; in/out of chair without difficulty gait sure and steady in hall  Skin: Skin is warm, dry and intact. No abrasion, no bruising, no burn, no ecchymosis, no laceration, no lesion, no petechiae and no rash noted. She is not diaphoretic. No cyanosis or erythema. No pallor. Nails show no clubbing.  Psychiatric: She has a normal mood and affect. Her speech is normal and behavior is normal. Judgment and thought content normal. Cognition and memory are normal.  Nursing note and vitals reviewed.         Assessment & Plan:  A-right acute nonsupportive otitis media; left otitis media effusion  P-augmentin 875mg  po BID x 10 days #20 RF0 dispensed from [facility]; has motrin at home 800mg  po TID prn pain; start claritin or zyrtec 10mg  po daily OTC generic  Treatment as  ordered. Symptomatic therapy suggested fluids, NSAIDs and rest. May take Tylenol or Motrin for fevers. Call or return to clinic as needed if these symptoms worsen or fail to improve as anticipated. Exitcare handout on otitis media given to patient. Patient verbalized agreement and understanding of treatment plan.  P2: Hand washing   Consider antihistamine or nasal steroid use. Avoid triggers if possible. Shower prior to bedtime if exposed to triggers. If  allergic dust/dust mites recommend mattress/pillow covers/encasements; washing linens, vacuuming, sweeping, dusting weekly. Call or return to clinic as needed if these symptoms worsen or fail to improve as anticipated. Exitcare handout on allergic rhinitis given to patient. Patient verbalized understanding of instructions, agreed with plan of care and had no further questions at this time.  P2: Avoidance and hand washing.

## 2017-07-14 NOTE — Patient Instructions (Signed)
Allergic Rhinitis Allergic rhinitis is when the mucous membranes in the nose respond to allergens. Allergens are particles in the air that cause your body to have an allergic reaction. This causes you to release allergic antibodies. Through a chain of events, these eventually cause you to release histamine into the blood stream. Although meant to protect the body, it is this release of histamine that causes your discomfort, such as frequent sneezing, congestion, and an itchy, runny nose. What are the causes? Seasonal allergic rhinitis (hay fever) is caused by pollen allergens that may come from grasses, trees, and weeds. Year-round allergic rhinitis (perennial allergic rhinitis) is caused by allergens such as house dust mites, pet dander, and mold spores. What are the signs or symptoms?  Nasal stuffiness (congestion).  Itchy, runny nose with sneezing and tearing of the eyes. How is this diagnosed? Your health care provider can help you determine the allergen or allergens that trigger your symptoms. If you and your health care provider are unable to determine the allergen, skin or blood testing may be used. Your health care provider will diagnose your condition after taking your health history and performing a physical exam. Your health care provider may assess you for other related conditions, such as asthma, pink eye, or an ear infection. How is this treated? Allergic rhinitis does not have a cure, but it can be controlled by:  Medicines that block allergy symptoms. These may include allergy shots, nasal sprays, and oral antihistamines.  Avoiding the allergen.  Hay fever may often be treated with antihistamines in pill or nasal spray forms. Antihistamines block the effects of histamine. There are over-the-counter medicines that may help with nasal congestion and swelling around the eyes. Check with your health care provider before taking or giving this medicine. If avoiding the allergen or the  medicine prescribed do not work, there are many new medicines your health care provider can prescribe. Stronger medicine may be used if initial measures are ineffective. Desensitizing injections can be used if medicine and avoidance does not work. Desensitization is when a patient is given ongoing shots until the body becomes less sensitive to the allergen. Make sure you follow up with your health care provider if problems continue. Follow these instructions at home: It is not possible to completely avoid allergens, but you can reduce your symptoms by taking steps to limit your exposure to them. It helps to know exactly what you are allergic to so that you can avoid your specific triggers. Contact a health care provider if:  You have a fever.  You develop a cough that does not stop easily (persistent).  You have shortness of breath.  You start wheezing.  Symptoms interfere with normal daily activities. This information is not intended to replace advice given to you by your health care provider. Make sure you discuss any questions you have with your health care provider. Document Released: 07/27/2001 Document Revised: 07/02/2016 Document Reviewed: 07/09/2013 Elsevier Interactive Patient Education  2017 [place]. Otitis Media, Adult Otitis media is redness, soreness, and puffiness (swelling) in the space just behind your eardrum (middle ear). It may be caused by allergies or infection. It often happens along with a cold. Follow these instructions at home:  Take your medicine as told. Finish it even if you start to feel better.  Only take over-the-counter or prescription medicines for pain, discomfort, or fever as told by your doctor.  Follow up with your doctor as told. Contact a doctor if:  You have otitis  media only in one ear, or bleeding from your nose, or both.  You notice a lump on your neck.  You are not getting better in 3-5 days.  You feel worse instead of better. Get  help right away if:  You have pain that is not helped with medicine.  You have puffiness, redness, or pain around your ear.  You get a stiff neck.  You cannot move part of your face (paralysis).  You notice that the bone behind your ear hurts when you touch it. This information is not intended to replace advice given to you by your health care provider. Make sure you discuss any questions you have with your health care provider. Document Released: 04/19/2008 Document Revised: 04/08/2016 Document Reviewed: 05/29/2013 Elsevier Interactive Patient Education  2017 [employer].

## 2017-09-09 ENCOUNTER — Other Ambulatory Visit: Payer: Self-pay | Admitting: Internal Medicine

## 2017-09-09 DIAGNOSIS — Z1231 Encounter for screening mammogram for malignant neoplasm of breast: Secondary | ICD-10-CM

## 2017-09-15 ENCOUNTER — Ambulatory Visit
Admission: RE | Admit: 2017-09-15 | Discharge: 2017-09-15 | Disposition: A | Discharge status: Home / Self Care | Payer: No Typology Code available for payment source | Source: Ambulatory Visit | Attending: Internal Medicine | Admitting: Internal Medicine

## 2017-09-15 DIAGNOSIS — Z1231 Encounter for screening mammogram for malignant neoplasm of breast: Secondary | ICD-10-CM | POA: Diagnosis present

## 2018-02-27 DIAGNOSIS — M65342 Trigger finger, left ring finger: Secondary | ICD-10-CM | POA: Insufficient documentation

## 2018-02-27 DIAGNOSIS — M65331 Trigger finger, right middle finger: Secondary | ICD-10-CM | POA: Insufficient documentation

## 2018-03-21 ENCOUNTER — Ambulatory Visit: Payer: Self-pay | Admitting: Registered Nurse

## 2018-03-21 ENCOUNTER — Encounter: Payer: Self-pay | Admitting: Registered Nurse

## 2018-03-21 VITALS — BP 137/94 | HR 90 | Temp 98.4°F

## 2018-03-21 DIAGNOSIS — H65191 Other acute nonsuppurative otitis media, right ear: Secondary | ICD-10-CM

## 2018-03-21 DIAGNOSIS — K1120 Sialoadenitis, unspecified: Secondary | ICD-10-CM

## 2018-03-21 DIAGNOSIS — J301 Allergic rhinitis due to pollen: Secondary | ICD-10-CM

## 2018-03-21 NOTE — Progress Notes (Signed)
Subjective:    Patient ID: Sara Cook, female    DOB: 1966-07-15, 52 y.o.   MRN: [id number]  51y/o Caucasian established female pt c/o R ear and jaw pain and swelling x4 days, progressively worsening. Denies ear drainage, sinus pain/pressure, nasal congestion, cough. Reports pressure behind R eye, R temple, with pain extending down R jaw. Itching to R ear. States "sometimes it doesn't feel like it's mine. Or like it's numb or not there" while pointing to R ear, jaw and cheek to [employer]. Has upper denture plate; last saw dentist five years ago.  Denied dental issue.  Denied eating hard foods poking gums/cheek or biting cheek. Teeth have been extracted from lower right gums. Front lower natural teeth present. No bridge or dentures present to lower teeth. Swelling noted to R oral buccal. No obvious open wound, open abscess. Denies noticing any drainage, newly broken or infected teeth or open oral wounds herself since pain started. Used Oragel yesterday with some temporary relief, but caused entire mouth to be numb due to spreading of gel.  Has been brushing teeth twice a day and using listerine twice a day.  Typically does not floss because gums are sore.  Denied fever/chills/sweats/dysphagia/dysphasia/tongue swelling.     Review of Systems  Constitutional: Negative for activity change, appetite change, chills, diaphoresis, fatigue, fever and unexpected weight change.  HENT: Positive for ear pain and mouth sores. Negative for congestion, dental problem, drooling, ear discharge, facial swelling, hearing loss, nosebleeds, postnasal drip, rhinorrhea, sinus pressure, sinus pain, sneezing, sore throat, tinnitus, trouble swallowing and voice change.   Eyes: Negative for photophobia, pain, discharge, redness, itching and visual disturbance.  Respiratory: Negative for cough, choking, chest tightness, shortness of breath, wheezing and stridor.   Cardiovascular: Negative for chest pain, palpitations and  leg swelling.  Gastrointestinal: Negative for abdominal pain, diarrhea, nausea and vomiting.  Endocrine: Negative for cold intolerance and heat intolerance.  Genitourinary: Negative for difficulty urinating, dysuria and hematuria.  Musculoskeletal: Negative for arthralgias, back pain, gait problem, joint swelling, myalgias, neck pain and neck stiffness.  Skin: Negative for color change, pallor, rash and wound.  Allergic/Immunologic: Positive for environmental allergies. Negative for food allergies.  Neurological: Positive for numbness and headaches. Negative for dizziness, tremors, seizures, syncope, facial asymmetry, speech difficulty, weakness and light-headedness.  Hematological: Negative for adenopathy. Does not bruise/bleed easily.  Psychiatric/Behavioral: Negative for agitation, confusion and sleep disturbance.       Objective:   Physical Exam  Constitutional: She is oriented to person, place, and time. She appears well-developed and well-nourished. She is active and cooperative.  Non-toxic appearance. She does not have a sickly appearance. She appears ill. No distress.  HENT:  Head: Normocephalic and atraumatic.  Right Ear: Hearing, external ear and ear canal normal. A middle ear effusion is present.  Left Ear: Hearing, external ear and ear canal normal. There is tenderness. Tympanic membrane is erythematous and bulging. A middle ear effusion is present.  Nose: Mucosal edema and rhinorrhea present. No nose lacerations, sinus tenderness, nasal deformity, septal deviation or nasal septal hematoma. No epistaxis.  No foreign bodies. Right sinus exhibits no maxillary sinus tenderness and no frontal sinus tenderness. Left sinus exhibits no maxillary sinus tenderness and no frontal sinus tenderness.  Mouth/Throat: Uvula is midline and mucous membranes are normal. Mucous membranes are not pale, not dry and not cyanotic. She has dentures. Oral lesions present. No trismus in the jaw. Normal  dentition. No dental abscesses, uvula swelling, lacerations or dental  caries. Posterior oropharyngeal edema and posterior oropharyngeal erythema present. No oropharyngeal exudate or tonsillar abscesses.  Right salivary buccal gland swollen tender mucosa centrall with mild erythema and TTP; maxillary and frontal bilateral sinuses not TTP; right TM erythema 12-2 bulging and air fluid level clear; left TM air fluid level clear; cobblestoning posterior pharynx; bilateral allergic shiners; nasal turbinates edema clear discharge; TMJ not bilaterally not TTP full AROM but patient complained of lower jaw and right ear pain with TMJ AROM  Eyes: Pupils are equal, round, and reactive to light. Conjunctivae, EOM and lids are normal. Right eye exhibits no chemosis, no discharge, no exudate and no hordeolum. No foreign body present in the right eye. Left eye exhibits no chemosis, no discharge, no exudate and no hordeolum. No foreign body present in the left eye. Right conjunctiva is not injected. Right conjunctiva has no hemorrhage. Left conjunctiva is not injected. Left conjunctiva has no hemorrhage. No scleral icterus. Right eye exhibits normal extraocular motion and no nystagmus. Left eye exhibits normal extraocular motion and no nystagmus. Right pupil is round and reactive. Left pupil is round and reactive. Pupils are equal.  Neck: Trachea normal and normal range of motion. Neck supple. No tracheal tenderness, no spinous process tenderness and no muscular tenderness present. No neck rigidity. No tracheal deviation, no edema, no erythema and normal range of motion present. No thyroid mass and no thyromegaly present.  Cardiovascular: Normal rate, regular rhythm, S1 normal, S2 normal, normal heart sounds and intact distal pulses. PMI is not displaced. Exam reveals no gallop and no friction rub.  No murmur heard. Pulmonary/Chest: Effort normal and breath sounds normal. No accessory muscle usage or stridor. No respiratory  distress. She has no decreased breath sounds. She has no wheezes. She has no rhonchi. She has no rales. She exhibits no tenderness.  No cough observed in exam room; spoke full sentences without difficulty  Abdominal: Soft. Normal appearance. She exhibits no distension, no fluid wave and no ascites. There is no rigidity and no guarding.  Musculoskeletal: Normal range of motion. She exhibits no edema, tenderness or deformity.       Right shoulder: Normal.       Left shoulder: Normal.       Right elbow: Normal.      Left elbow: Normal.       Right hip: Normal.       Left hip: Normal.       Right knee: Normal.       Left knee: Normal.       Cervical back: Normal.       Thoracic back: Normal.       Lumbar back: Normal.       Right hand: Normal.       Left hand: Normal.  Lymphadenopathy:       Head (right side): No submental, no submandibular, no tonsillar, no preauricular, no posterior auricular and no occipital adenopathy present.       Head (left side): No submental, no submandibular, no tonsillar, no preauricular, no posterior auricular and no occipital adenopathy present.    She has no cervical adenopathy.       Right cervical: No superficial cervical, no deep cervical and no posterior cervical adenopathy present.      Left cervical: No superficial cervical, no deep cervical and no posterior cervical adenopathy present.  Neurological: She is alert and oriented to person, place, and time. She has normal strength. She is not disoriented. She displays no atrophy and  no tremor. No cranial nerve deficit or sensory deficit. She exhibits normal muscle tone. She displays no seizure activity. Coordination and gait normal. GCS eye subscore is 4. GCS verbal subscore is 5. GCS motor subscore is 6.  On/off exam table; in/out of chair without difficulty; gait sure and steady hallway; bilateral hand grasp equal 5/5; drank water and took tylenol tabs in clinic without difficulty swallowing  Skin: Skin is  warm, dry and intact. Capillary refill takes less than 2 seconds. No abrasion, no bruising, no burn, no ecchymosis, no laceration, no lesion, no petechiae and no rash noted. She is not diaphoretic. No cyanosis or erythema. No pallor. Nails show no clubbing.  Psychiatric: She has a normal mood and affect. Her speech is normal and behavior is normal. Judgment and thought content normal. She is not actively hallucinating. Cognition and memory are normal. She is attentive.  Nursing note and vitals reviewed.         Assessment & Plan:  A-salivary gland swelling right, right otitis media acute, seasonal allergic rhinitis  P-augmentin 875mg  po BID x 10 days #20 RF0 dispensed from [facility].  Motrin 800mg  po TID prn pain take with food #30 RF0 dispensed from [facility].  Tylenol 1000mg  po QID prn pain dispensed 4 UD from clinic stock.  Refused nose sprays.  Listerine/salt water rinse oral at least bid.  Brush teeth twice a day.  Soft foods avoid hard crunchy until swelling in right cheek resolved.  Follow up scheduled 1200 23 Mar 2018. Supportive treatment.   No evidence of invasive bacterial infection, non toxic and well hydrated.  This is most likely self limiting viral infection.  I do not see where any further testing or imaging is necessary at this time.   I will suggest supportive care, rest, good hygiene and encourage the patient to take adequate fluids.  The patient is to return to clinic or EMERGENCY ROOM if symptoms worsen or change significantly e.g. ear pain, fever, purulent discharge from ears or bleeding.  Exitcare handout on otitis media  given to patient.  Patient verbalized agreement and understanding of treatment plan and had no further questions at this time.  Refused nose sprays cannot tolerate tried in past.  OTC antihistamine of choice claritin/zyrtec 10mg  po daily.  Avoid triggers if possible.  Shower prior to bedtime if exposed to triggers.  If allergic dust/dust mites recommend mattress/pillow  covers/encasements; washing linens, vacuuming, sweeping, dusting weekly.  Call or return to clinic as needed if these symptoms worsen or fail to improve as anticipated.   Exitcare handout on allergic rhinitis and sinus rinse given to patient.  Patient verbalized understanding of instructions, agreed with plan of care and had no further questions at this time.  P2:  Avoidance and hand washing.  Discussed if drooling, dyspnea, dysphagia patient to follow up with ER.  Take antibiotic as ordered augmentin 875mg  po BID x 10 days dispensed from [facility] #20 RF0. NSAID po TID/QID. Salt water gargles po prn or listerine after every meal/snack. Good dental hygiene brushing, flossing after each meal/snack.  Discussed flossing teeth may be sore at first but that will resolve over time  Follow up sooner with PCM/dentist if no improvement or worsening of symptoms e.g. fever, chills, dyspnea, SOB, purulent discharge, neck pain, eye socket redness/pain and clinic closed tomorrow at [facility]. Patient verbalized understanding of information/instructions agreed with plan of care and had no further questions at this time.

## 2018-03-21 NOTE — Patient Instructions (Addendum)
Allergic Rhinitis, Adult Allergic rhinitis is an allergic reaction that affects the mucous membrane inside the nose. It causes sneezing, a runny or stuffy nose, and the feeling of mucus going down the back of the throat (postnasal drip). Allergic rhinitis can be mild to severe. There are two types of allergic rhinitis:  Seasonal. This type is also called hay fever. It happens only during certain seasons.  Perennial. This type can happen at any time of the year.  What are the causes? This condition happens when the body's defense system (immune system) responds to certain harmless substances called allergens as though they were germs.  Seasonal allergic rhinitis is triggered by pollen, which can come from grasses, trees, and weeds. Perennial allergic rhinitis may be caused by:  House dust mites.  Pet dander.  Mold spores.  What are the signs or symptoms? Symptoms of this condition include:  Sneezing.  Runny or stuffy nose (nasal congestion).  Postnasal drip.  Itchy nose.  Tearing of the eyes.  Trouble sleeping.  Daytime sleepiness.  How is this diagnosed? This condition may be diagnosed based on:  Your medical history.  A physical exam.  Tests to check for related conditions, such as: ? Asthma. ? Pink eye. ? Ear infection. ? Upper respiratory infection.  Tests to find out which allergens trigger your symptoms. These may include skin or blood tests.  How is this treated? There is no cure for this condition, but treatment can help control symptoms. Treatment may include:  Taking medicines that block allergy symptoms, such as antihistamines. Medicine may be given as a shot, nasal spray, or pill.  Avoiding the allergen.  Desensitization. This treatment involves getting ongoing shots until your body becomes less sensitive to the allergen. This treatment may be done if other treatments do not help.  If taking medicine and avoiding the allergen does not work, new,  stronger medicines may be prescribed.  Follow these instructions at home:  Find out what you are allergic to. Common allergens include smoke, dust, and pollen.  Avoid the things you are allergic to. These are some things you can do to help avoid allergens: ? Replace carpet with wood, tile, or vinyl flooring. Carpet can trap dander and dust. ? Do not smoke. Do not allow smoking in your home. ? Change your heating and air conditioning filter at least once a month. ? During allergy season:  Keep windows closed as much as possible.  Plan outdoor activities when pollen counts are lowest. This is usually during the evening hours.  When coming indoors, change clothing and shower before sitting on furniture or bedding.  Take over-the-counter and prescription medicines only as told by your health care provider.  Keep all follow-up visits as told by your health care provider. This is important. Contact a health care provider if:  You have a fever.  You develop a persistent cough.  You make whistling sounds when you breathe (you wheeze).  Your symptoms interfere with your normal daily activities. Get help right away if:  You have shortness of breath. Summary  This condition can be managed by taking medicines as directed and avoiding allergens.  Contact your health care provider if you develop a persistent cough or fever.  During allergy season, keep windows closed as much as possible. This information is not intended to replace advice given to you by your health care provider. Make sure you discuss any questions you have with your health care provider. Document Released: 07/27/2001 Document Revised: 12/09/2016  Document Reviewed: 12/09/2016 Elsevier Interactive Patient Education  2018 [employer]. Otitis Media, Adult Otitis media occurs when there is inflammation and fluid in the middle ear. Your middle ear is a part of the ear that contains bones for hearing as well as air that  helps send sounds to your brain. What are the causes? This condition is caused by a blockage in the eustachian tube. This tube drains fluid from the ear to the back of the nose (nasopharynx). A blockage in this tube can be caused by an object or by swelling (edema) in the tube. Problems that can cause a blockage include:  A cold or other upper respiratory infection.  Allergies.  An irritant, such as tobacco smoke.  Enlarged adenoids. The adenoids are areas of soft tissue located high in the back of the throat, behind the nose and the roof of the mouth.  A mass in the nasopharynx.  Damage to the ear caused by pressure changes (barotrauma).  What are the signs or symptoms? Symptoms of this condition include:  Ear pain.  A fever.  Decreased hearing.  A headache.  Tiredness (lethargy).  Fluid leaking from the ear.  Ringing in the ear.  How is this diagnosed? This condition is diagnosed with a physical exam. During the exam your health care provider will use an instrument called an otoscope to look into your ear and check for redness, swelling, and fluid. He or she will also ask about your symptoms. Your health care provider may also order tests, such as:  A test to check the movement of the eardrum (pneumatic otoscopy). This test is done by squeezing a small amount of air into the ear.  A test that changes air pressure in the middle ear to check how well the eardrum moves and whether the eustachian tube is working (tympanogram).  How is this treated? This condition usually goes away on its own within 3-5 days. But if the condition is caused by a bacteria infection and does not go away own its own, or keeps coming back, your health care provider may:  Prescribe antibiotic medicines to treat the infection.  Prescribe or recommend medicines to control pain.  Follow these instructions at home:  Take over-the-counter and prescription medicines only as told by your health care  provider.  If you were prescribed an antibiotic medicine, take it as told by your health care provider. Do not stop taking the antibiotic even if you start to feel better.  Keep all follow-up visits as told by your health care provider. This is important. Contact a health care provider if:  You have bleeding from your nose.  There is a lump on your neck.  You are not getting better in 5 days.  You feel worse instead of better. Get help right away if:  You have severe pain that is not controlled with medicine.  You have swelling, redness, or pain around your ear.  You have stiffness in your neck.  A part of your face is paralyzed.  The bone behind your ear (mastoid) is tender when you touch it.  You develop a severe headache. Summary  Otitis media is redness, soreness, and swelling of the middle ear.  This condition usually goes away on its own within 3-5 days.  If the problem does not go away in 3-5 days, your health care provider may prescribe or recommend medicines to treat your symptoms.  If you were prescribed an antibiotic medicine, take it as told by your  health care provider. This information is not intended to replace advice given to you by your health care provider. Make sure you discuss any questions you have with your health care provider. Document Released: 08/06/2004 Document Revised: 10/22/2016 Document Reviewed: 10/22/2016 Elsevier Interactive Patient Education  2018 [employer]. Sinus Rinse What is a sinus rinse? A sinus rinse is a simple home treatment that is used to rinse your sinuses with a sterile mixture of salt and water (saline solution). Sinuses are air-filled spaces in your skull behind the bones of your face and forehead that open into your nasal cavity. You will use the following:  Saline solution.  Neti pot or spray bottle. This releases the saline solution into your nose and through your sinuses. Neti pots and spray bottles can be  purchased at [employer], a health food store, or online.  When would I do a sinus rinse? A sinus rinse can help to clear mucus, dirt, dust, or pollen from the nasal cavity. You may do a sinus rinse when you have a cold, a virus, nasal allergy symptoms, a sinus infection, or stuffiness in the nose or sinuses. If you are considering a sinus rinse:  Ask your child's health care provider before performing a sinus rinse on your child.  Do not do a sinus rinse if you have had ear or nasal surgery, ear infection, or blocked ears.  How do I do a sinus rinse?  Wash your hands.  Disinfect your device according to the directions provided and then dry it.  Use the solution that comes with your device or one that is sold separately in stores. Follow the mixing directions on the package.  Fill your device with the amount of saline solution as directed by the device instructions.  Stand over a sink and tilt your head sideways over the sink.  Place the spout of the device in your upper nostril (the one closer to the ceiling).  Gently pour or squeeze the saline solution into the nasal cavity. The liquid should drain to the lower nostril if you are not overly congested.  Gently blow your nose. Blowing too hard may cause ear pain.  Repeat in the other nostril.  Clean and rinse your device with clean water and then air-dry it. Are there risks of a sinus rinse? Sinus rinse is generally very safe and effective. However, there are a few risks, which include:  A burning sensation in the sinuses. This may happen if you do not make the saline solution as directed. Make sure to follow all directions when making the saline solution.  Infection from contaminated water. This is rare, but possible.  Nasal irritation.  This information is not intended to replace advice given to you by your health care provider. Make sure you discuss any questions you have with your health care provider. Document  Released: 05/29/2014 Document Revised: 09/28/2016 Document Reviewed: 03/19/2014 Elsevier Interactive Patient Education  20 Salivary Gland Infection A salivary gland infection is an infection in one or more of the glands that produce spit (saliva). You have six major salivary glands. Each gland has a duct that carries saliva into your mouth. Saliva keeps your mouth moist and breaks down the food that you eat. It also helps to prevent tooth decay. Two salivary glands are located just in front of your ears (parotid). The ducts for these glands open up inside your cheeks, near your back teeth. You also have two glands under your tongue (sublingual) and two glands  under your jaw (submandibular). The ducts for these glands open under your tongue. Any salivary gland can become infected. Most infections occur in the parotid glands or submandibular glands. What are the causes? Salivary glands can be infected by viruses or bacteria.  The mumps virus is the most common cause of viral salivary gland infections, though mumps is now rare in many areas because of vaccination. ? This infection causes swelling in both parotid glands. ? Viral infections are more common in children.  The bacteria that cause salivary gland infections are usually the same bacteria that normally live in your mouth. ? A stone can form in a salivary gland and block the flow of saliva. As a result, saliva backs up into the salivary gland. Bacteria may then start to grow behind the blockage and cause infection. ? Bacterial infections usually cause pain and swelling on one side of the face. Submandibular gland swelling occurs under the jaw. Parotid swelling occurs in front of the ear. ? Bacterial infections are more common in adults.  What increases the risk? Children who do not get the MMR (measles, mumps, rubella) vaccine are more likely to get mumps, which can cause a viral salivary gland infection. Risk factors for bacterial infections  include:  Poor dental care (oral hygiene).  Smoking.  Not drinking enough water.  Having a disease that causes dry mouth and dry eyes (Mikulicz syndrome or Sjogren syndrome).  What are the signs or symptoms? The main sign of salivary gland infection is a swollen salivary gland. This type of inflammation is often called sialadenitis. You may have swelling in front of your ear, under your jaw, or under your tongue. Swelling may get worse when you eat and decrease after you eat. Other signs and symptoms include:  Pain.  Tenderness.  Redness.  Dry mouth.  Bad taste in your mouth.  Difficulty chewing and swallowing.  Fever.  How is this diagnosed? Your health care provider may suspect a salivary gland infection based on your signs and symptoms. He or she will also do a physical exam. The health care provider will look and feel inside your mouth to see whether a stone is blocking a salivary gland duct. You may need to see an ear, nose, and throat specialist (ENT or otolaryngologist) for diagnosis and treatment. You may also need to have diagnostic tests, such as:  An X-ray to check for a stone.  Other imaging studies to look for an abscess and to rule out other causes of swelling. These tests may include: ? Ultrasound. ? CT scan. ? MRI.  Culture and sensitivity test. This involves collecting a sample of pus for testing in the lab to see what bacteria grow and what antibiotics they are sensitive to. The testing sample may be: ? Swabbed from a salivary gland duct. ? Withdrawn from a swollen gland with a needle (aspiration).  How is this treated? Viral salivary gland infections usually clear up without treatment. Bacterial infections are usually treated with antibiotic medicine. Severe infections that cause difficulty with swallowing may be treated with an IV antibiotic in the hospital. Other treatments may include:  Probing and widening the salivary duct to allow a stone to pass.  In some cases, a thin, flexible scope (endoscope) may be inserted into the duct to find a stone and remove it.  Breaking up a stone using sound waves.  Draining an infected gland (abscess) with a needle.  In some cases, you may need surgery so your health care provider  can: ? Remove a stone. ? Drain pus from an abscess. ? Remove a badly infected gland.  Follow these instructions at home:  Take medicines only as directed by your health care provider.  If you were prescribed an antibiotic medicine, finish it all even if you start to feel better.  Follow these instructions every few hours: ? Suck on a lemon candy to stimulate the flow of saliva. ? Put a warm compress over the gland. ? Gently massage the gland.  Drink enough fluid to keep your urine clear or pale yellow.  Rinse your mouth with a mixture of warm water and salt every few hours. To make this mixture, add a pinch of salt to 1 cup of warm water.  Practice good oral hygiene by brushing and flossing your teeth after meals and before you go to bed.  Do not use any tobacco products, including cigarettes, chewing tobacco, or electronic cigarettes. If you need help quitting, ask your health care provider. Contact a health care provider if:  You have pain and swelling in your face, jaw, or mouth after eating.  You have persistent swelling in any of these places: ? In front of your ear. ? Under your jaw. ? Inside your mouth. Get help right away if:  You have pain and swelling in your face, jaw, or mouth that are getting worse.  Your pain and swelling make it hard to swallow or breathe. This information is not intended to replace advice given to you by your health care provider. Make sure you discuss any questions you have with your health care provider. Document Released: 12/09/2004 Document Revised: 04/08/2016 Document Reviewed: 04/03/2014 Elsevier Interactive Patient Education  2018 [employer].

## 2018-03-22 MED ORDER — AMOXICILLIN-POT CLAVULANATE 875-125 MG PO TABS: 1.0000 | ORAL_TABLET | Freq: Two times a day (BID) | ORAL | 0 refills | Status: AC

## 2018-03-22 MED ORDER — ACETAMINOPHEN 500 MG PO TABS: 1000.0000 mg | ORAL_TABLET | Freq: Four times a day (QID) | ORAL | 0 refills | Status: AC | PRN

## 2018-10-17 ENCOUNTER — Ambulatory Visit: Payer: Self-pay | Admitting: Registered Nurse

## 2018-10-17 ENCOUNTER — Encounter: Payer: Self-pay | Admitting: Registered Nurse

## 2018-10-17 VITALS — BP 121/88 | HR 91 | Temp 98.8°F

## 2018-10-17 DIAGNOSIS — H66001 Acute suppurative otitis media without spontaneous rupture of ear drum, right ear: Secondary | ICD-10-CM

## 2018-10-17 DIAGNOSIS — J019 Acute sinusitis, unspecified: Secondary | ICD-10-CM

## 2018-10-17 MED ORDER — IBUPROFEN 200 MG PO TABS: 800.0000 mg | ORAL_TABLET | Freq: Three times a day (TID) | ORAL | 0 refills | Status: AC | PRN

## 2018-10-17 MED ORDER — SALINE SPRAY 0.65 % NA SOLN: 2.0000 | NASAL | 0 refills | Status: DC

## 2018-10-17 MED ORDER — AMOXICILLIN-POT CLAVULANATE 875-125 MG PO TABS: 1.0000 | ORAL_TABLET | Freq: Two times a day (BID) | ORAL | 0 refills | Status: AC

## 2018-10-17 MED ORDER — PHENYLEPHRINE HCL 10 MG PO TABS: 10.0000 mg | ORAL_TABLET | Freq: Four times a day (QID) | ORAL | Status: AC | PRN

## 2018-10-17 NOTE — Progress Notes (Signed)
Subjective:    Patient ID: Sara Cook, female    DOB: 10-21-1966, 52 y.o.   MRN: [id number]  52y/o Caucasian established female pt c/o sore throat, maxillary and frontal sinus pain and pressure, pressure behind eyes, nasal congestion, nonproductive cough x2-3 days.  Ear pain right yesterday currently not bothering her denied drainage.   Has used Tylenol and green tea at home with little relief. + sick contacts at work doesn't like nose sprays refused flonase showering prior to bedtime has used augmentin in the past worked well     Review of Systems  Constitutional: Negative for activity change, appetite change, chills, diaphoresis, fatigue, fever and unexpected weight change.  HENT: Positive for congestion, ear pain, postnasal drip, rhinorrhea, sinus pressure, sinus pain and sore throat. Negative for dental problem, drooling, ear discharge, facial swelling, hearing loss, mouth sores, nosebleeds, sneezing, tinnitus, trouble swallowing and voice change.   Eyes: Negative for photophobia, pain, discharge, redness, itching and visual disturbance.  Respiratory: Positive for cough. Negative for choking, chest tightness, shortness of breath, wheezing and stridor.   Cardiovascular: Negative for chest pain, palpitations and leg swelling.  Gastrointestinal: Negative for abdominal distention, abdominal pain, diarrhea, nausea and vomiting.  Endocrine: Negative for cold intolerance and heat intolerance.  Genitourinary: Negative for difficulty urinating, dysuria and hematuria.  Musculoskeletal: Negative for arthralgias, back pain, gait problem, joint swelling, myalgias, neck pain and neck stiffness.  Skin: Negative for color change, pallor, rash and wound.  Allergic/Immunologic: Positive for environmental allergies. Negative for food allergies.  Neurological: Negative for dizziness, tremors, seizures, syncope, facial asymmetry, speech difficulty, weakness, light-headedness, numbness and headaches.   Hematological: Negative for adenopathy. Does not bruise/bleed easily.  Psychiatric/Behavioral: Negative for agitation, behavioral problems, confusion and sleep disturbance.       Objective:   Physical Exam  Constitutional: She is oriented to person, place, and time. Vital signs are normal. She appears well-developed and well-nourished. She is active and cooperative.  Non-toxic appearance. She does not have a sickly appearance. She appears ill. No distress.  HENT:  Head: Normocephalic and atraumatic.  Right Ear: Hearing, external ear and ear canal normal. No drainage, swelling or tenderness. Tympanic membrane is erythematous and bulging. A middle ear effusion is present.  Left Ear: Hearing, external ear and ear canal normal. Tympanic membrane is not bulging. A middle ear effusion is present.  Nose: Mucosal edema and rhinorrhea present. No nose lacerations, sinus tenderness, nasal deformity, septal deviation or nasal septal hematoma. No epistaxis.  No foreign bodies. Right sinus exhibits maxillary sinus tenderness and frontal sinus tenderness. Left sinus exhibits maxillary sinus tenderness and frontal sinus tenderness.  Mouth/Throat: Uvula is midline and mucous membranes are normal. Mucous membranes are not pale, not dry and not cyanotic. She does not have dentures. No oral lesions. No trismus in the jaw. Normal dentition. No dental abscesses, uvula swelling, lacerations or dental caries. Posterior oropharyngeal edema and posterior oropharyngeal erythema present. No oropharyngeal exudate or tonsillar abscesses. Tonsils are 1+ on the right. Tonsils are 1+ on the left. No tonsillar exudate.  Frontal greater than maxillary sinus bilaterally TTP; cobblestoning posterior pharynx; bilateral TMS air fluid level clear; right TM erythema/bulging intact; bilateral allergic shiners; tonsils edema erythema 1+/4 no exudate; nasal turbinates edema erythema clear discharge  Eyes: Pupils are equal, round, and  reactive to light. Conjunctivae, EOM and lids are normal. Right eye exhibits no chemosis, no discharge, no exudate and no hordeolum. No foreign body present in the right eye. Left eye exhibits no  chemosis, no discharge, no exudate and no hordeolum. No foreign body present in the left eye. Right conjunctiva is not injected. Right conjunctiva has no hemorrhage. Left conjunctiva is not injected. Left conjunctiva has no hemorrhage. No scleral icterus. Right eye exhibits normal extraocular motion and no nystagmus. Left eye exhibits normal extraocular motion and no nystagmus. Right pupil is round and reactive. Left pupil is round and reactive. Pupils are equal.  Neck: Trachea normal and normal range of motion. Neck supple. No tracheal tenderness, no spinous process tenderness and no muscular tenderness present. No neck rigidity. No tracheal deviation, no edema, no erythema and normal range of motion present. No thyroid mass and no thyromegaly present.  Cardiovascular: Normal rate, regular rhythm, S1 normal, S2 normal, normal heart sounds and intact distal pulses. PMI is not displaced. Exam reveals no gallop and no friction rub.  No murmur heard. Pulmonary/Chest: Effort normal and breath sounds normal. No accessory muscle usage or stridor. No respiratory distress. She has no decreased breath sounds. She has no wheezes. She has no rhonchi. She has no rales. She exhibits no tenderness.  Rare nonproductive cough in exam room; spoke full sentences without difficulty  Abdominal: Soft. She exhibits no distension. There is no guarding.  Musculoskeletal: Normal range of motion. She exhibits no edema or tenderness.       Right shoulder: Normal.       Left shoulder: Normal.       Right hip: Normal.       Left hip: Normal.       Right knee: Normal.       Left knee: Normal.       Cervical back: Normal.       Right hand: Normal.       Left hand: Normal.  Lymphadenopathy:       Head (right side): No submental, no  submandibular, no tonsillar, no preauricular, no posterior auricular and no occipital adenopathy present.       Head (left side): No submental, no submandibular, no tonsillar, no preauricular, no posterior auricular and no occipital adenopathy present.    She has no cervical adenopathy.       Right cervical: No superficial cervical, no deep cervical and no posterior cervical adenopathy present.      Left cervical: No superficial cervical, no deep cervical and no posterior cervical adenopathy present.  Neurological: She is alert and oriented to person, place, and time. She has normal strength. She is not disoriented. She displays no atrophy and no tremor. No cranial nerve deficit or sensory deficit. She exhibits normal muscle tone. She displays no seizure activity. Coordination and gait normal. GCS eye subscore is 4. GCS verbal subscore is 5. GCS motor subscore is 6.  Skin: Skin is warm, dry and intact. Capillary refill takes less than 2 seconds. No abrasion, no bruising, no burn, no ecchymosis, no laceration, no lesion, no petechiae and no rash noted. She is not diaphoretic. No cyanosis or erythema. No pallor. Nails show no clubbing.  Psychiatric: She has a normal mood and affect. Her speech is normal and behavior is normal. Judgment and thought content normal. Cognition and memory are normal.  Nursing note and vitals reviewed.         Assessment & Plan:  A-right acute otitis media, acute rhinosinusitis  P-Supportive treatment. Augmentin 875mg  po BID x 10 days #20 RF0 dispensed from [facility] to patient  Tylenol 1000mg  po QID prn pain/fever.   No evidence of invasive bacterial infection, non toxic  and well hydrated.  This is most likely self limiting viral infection.  I do not see where any further testing or imaging is necessary at this time.   I will suggest supportive care, rest, good hygiene and encourage the patient to take adequate fluids.  The patient is to return to clinic or EMERGENCY ROOM if  symptoms worsen or change significantly e.g. ear pain, fever, purulent discharge from ears or bleeding.  Exitcare handout on otitis media   Patient verbalized agreement and understanding of treatment plan.    Cough drops po q2h prn cough given 8 UD from clinic stock.  Hydrate to keep urine pale clear yellow tinged.  Phenylephrine 10mg  po q6h prn rhinitis, saline 2 sprays each nostril q2h wa prn congestion.  If no improvement with 48 hours of saline use start augmentin 875mg  po BID x 10 days #20 RF0 dispensed from [facility] to patient  Denied personal or family history of ENT cancer.  Shower BID especially prior to bed. No evidence of systemic bacterial infection, non toxic and well hydrated.  I do not see where any further testing or imaging is necessary at this time.   I will suggest supportive care, rest, good hygiene and encourage the patient to take adequate fluids.  The patient is to return to clinic or EMERGENCY ROOM if symptoms worsen or change significantly.  Exitcare handout on sinusitis, pharyngitis and sinus rinse.  Patient verbalized agreement and understanding of treatment plan and had no further questions at this time.   P2:  Hand washing and cover cough  Patient may use normal saline nasal spray 2 sprays each nostril q2h wa as needed OTC antihistamine of choice claritin/zyrtec 10mg  po daily.  Avoid triggers if possible.  Shower prior to bedtime if exposed to triggers.  If allergic dust/dust mites recommend mattress/pillow covers/encasements; washing linens, vacuuming, sweeping, dusting weekly.  Call or return to clinic as needed if these symptoms worsen or fail to improve as anticipated.   Exitcare handout on non allergic rhinitis and sinus rinse given to patient.  Patient verbalized understanding of instructions, agreed with plan of care and had no further questions at this time.  P2:  Avoidance and hand washing.

## 2018-10-17 NOTE — Patient Instructions (Addendum)
Nonallergic Rhinitis Nonallergic rhinitis is a condition that causes symptoms that affect the nose, such as a runny nose and a stuffed-up nose (nasal congestion) that can make it hard to breathe through the nose. This condition is different from having an allergy (allergic rhinitis). Allergic rhinitis occurs when the body's defense system (immune system) reacts to a substance that you are allergic to (allergen), such as pollen, pet dander, mold, or dust. Nonallergic rhinitis has many similar symptoms, but it is not caused by allergens. Nonallergic rhinitis can be a short-term or long-term problem. What are the causes? This condition can be caused by many different things. Some common types of nonallergic rhinitis include: Infectious rhinitis  This is usually due to an infection in the upper respiratory tract. Vasomotor rhinitis  This is the most common type of long-term nonallergic rhinitis.  It is caused by too much blood flow through the nose, which makes the tissue inside of the nose swell.  Symptoms are often triggered by strong odors, cold air, stress, drinking alcohol, cigarette smoke, or changes in the weather. Occupational rhinitis  This type is caused by triggers in the workplace, such as chemicals, dusts, animal dander, or air pollution. Hormonal rhinitis  This type occurs in women as a result of an increase in the female hormone estrogen.  It may occur during pregnancy, puberty, and menstrual cycles.  Symptoms improve when estrogen levels drop. Drug-induced rhinitis Several drugs can cause nonallergic rhinitis, including:  Medicines that are used to treat high blood pressure, heart disease, and Parkinson disease.  Aspirin and NSAIDs.  Over-the-counter nasal decongestant sprays. These can cause a type of nonallergic rhinitis (rhinitis medicamentosa) when they are used for more than a few days.  Nonallergic rhinitis with eosinophilia syndrome (NARES)  This type is caused  by having too much of a certain type of white blood cell (eosinophil). Nonallergic rhinitis can also be caused by a reaction to eating hot or spicy foods. This does not usually cause long-term symptoms. In some cases, the cause of nonallergic rhinitis is not known. What increases the risk? You are more likely to develop this condition if:  You are 110-3 years of age.  You are a woman. Women are twice as likely to have this condition.  What are the signs or symptoms? Common symptoms of this condition include:  Nasal congestion.  Runny nose.  The feeling of mucus going down the back of the throat (postnasal drip).  Trouble sleeping at night and daytime sleepiness.  Less common symptoms include:  Sneezing.  Coughing.  Itchy nose.  Bloodshot eyes.  How is this diagnosed? This condition may be diagnosed based on:  Your symptoms and medical history.  A physical exam.  Allergy testing to rule out allergic rhinitis. You may have skin tests or blood tests.  In some cases, the health care provider may take a swab of nasal secretions to look for an increased number of eosinophils. This would be done to confirm a diagnosis of NARES. How is this treated? Treatment for this condition depends on the cause. No single treatment works for everyone. Work with your health care provider to find the best treatment for you. Treatment may include:  Avoiding the things that trigger your symptoms.  Using medicines to relieve congestion, such as: ? Steroid nasal spray. There are many types. You may need to try a few to find out which one works best. ? Decongestant medicine. This may be an oral medicine or a nasal spray. These  medicines are only used for a short time.  Using medicines to relieve a runny nose. These may include antihistamine medicines or anticholinergic nasal sprays.  Surgery to remove tissue from inside the nose may be needed in severe cases if the condition has not improved  after 6-12 months of medical treatment. Follow these instructions at home:  Take or use over-the-counter and prescription medicines only as told by your health care provider. Do not stop using your medicine even if you start to feel better.  Use salt-water (saline) rinses or other solutions (nasal washes or irrigations) to wash or rinse out the inside of your nose as told by your health care provider.  Do not take NSAIDs or medicines that contain aspirin if they make your symptoms worse.  Do not drink alcohol if it makes your symptoms worse.  Do not use any tobacco products, such as cigarettes, chewing tobacco, and e-cigarettes. If you need help quitting, ask your health care provider.  Avoid secondhand smoke.  Get some exercise every day. Exercise may help reduce symptoms of nonallergic rhinitis for some people. Ask your health care provider how much exercise and what types of exercise are safe for you.  Sleep with the head of your bed raised (elevated). This may reduce nighttime nasal congestion.  Keep all follow-up visits as told by your health care provider. This is important. Contact a health care provider if:  You have a fever.  Your symptoms are getting worse at home.  Your symptoms are not responding to medicine.  You develop new symptoms, especially a headache or nosebleed. This information is not intended to replace advice given to you by your health care provider. Make sure you discuss any questions you have with your health care provider. Document Released: 02/23/2016 Document Revised: 04/08/2016 Document Reviewed: 01/22/2016 Elsevier Interactive Patient Education  2018 [employer]. Sinusitis, Adult Sinusitis is soreness and inflammation of your sinuses. Sinuses are hollow spaces in the bones around your face. Your sinuses are located:  Around your eyes.  In the middle of your forehead.  Behind your nose.  In your cheekbones.  Your sinuses and nasal passages  are lined with a stringy fluid (mucus). Mucus normally drains out of your sinuses. When your nasal tissues become inflamed or swollen, the mucus can become trapped or blocked so air cannot flow through your sinuses. This allows bacteria, viruses, and funguses to grow, which leads to infection. Sinusitis can develop quickly and last for 7?10 days (acute) or for more than 12 weeks (chronic). Sinusitis often develops after a cold. What are the causes? This condition is caused by anything that creates swelling in the sinuses or stops mucus from draining, including:  Allergies.  Asthma.  Bacterial or viral infection.  Abnormally shaped bones between the nasal passages.  Nasal growths that contain mucus (nasal polyps).  Narrow sinus openings.  Pollutants, such as chemicals or irritants in the air.  A foreign object stuck in the nose.  A fungal infection. This is rare.  What increases the risk? The following factors may make you more likely to develop this condition:  Having allergies or asthma.  Having had a recent cold or respiratory tract infection.  Having structural deformities or blockages in your nose or sinuses.  Having a weak immune system.  Doing a lot of swimming or diving.  Overusing nasal sprays.  Smoking.  What are the signs or symptoms? The main symptoms of this condition are pain and a feeling of pressure around  the affected sinuses. Other symptoms include:  Upper toothache.  Earache.  Headache.  Bad breath.  Decreased sense of smell and taste.  A cough that may get worse at night.  Fatigue.  Fever.  Thick drainage from your nose. The drainage is often green and it may contain pus (purulent).  Stuffy nose or congestion.  Postnasal drip. This is when extra mucus collects in the throat or back of the nose.  Swelling and warmth over the affected sinuses.  Sore throat.  Sensitivity to light.  How is this diagnosed? This condition is  diagnosed based on symptoms, a medical history, and a physical exam. To find out if your condition is acute or chronic, your health care provider may:  Look in your nose for signs of nasal polyps.  Tap over the affected sinus to check for signs of infection.  View the inside of your sinuses using an imaging device that has a light attached (endoscope).  If your health care provider suspects that you have chronic sinusitis, you may also:  Be tested for allergies.  Have a sample of mucus taken from your nose (nasal culture) and checked for bacteria.  Have a mucus sample examined to see if your sinusitis is related to an allergy.  If your sinusitis does not respond to treatment and it lasts longer than 8 weeks, you may have an MRI or CT scan to check your sinuses. These scans also help to determine how severe your infection is. In rare cases, a bone biopsy may be done to rule out more serious types of fungal sinus disease. How is this treated? Treatment for sinusitis depends on the cause and whether your condition is chronic or acute. If a virus is causing your sinusitis, your symptoms will go away on their own within 10 days. You may be given medicines to relieve your symptoms, including:  Topical nasal decongestants. They shrink swollen nasal passages and let mucus drain from your sinuses.  Antihistamines. These drugs block inflammation that is triggered by allergies. This can help to ease swelling in your nose and sinuses.  Topical nasal corticosteroids. These are nasal sprays that ease inflammation and swelling in your nose and sinuses.  Nasal saline washes. These rinses can help to get rid of thick mucus in your nose.  If your condition is caused by bacteria, you will be given an antibiotic medicine. If your condition is caused by a fungus, you will be given an antifungal medicine. Surgery may be needed to correct underlying conditions, such as narrow nasal passages. Surgery may also  be needed to remove polyps. Follow these instructions at home: Medicines  Take, use, or apply over-the-counter and prescription medicines only as told by your health care provider. These may include nasal sprays.  If you were prescribed an antibiotic medicine, take it as told by your health care provider. Do not stop taking the antibiotic even if you start to feel better. Hydrate and Humidify  Drink enough water to keep your urine clear or pale yellow. Staying hydrated will help to thin your mucus.  Use a cool mist humidifier to keep the humidity level in your home above 50%.  Inhale steam for 10-15 minutes, 3-4 times a day or as told by your health care provider. You can do this in the bathroom while a hot shower is running.  Limit your exposure to cool or dry air. Rest  Rest as much as possible.  Sleep with your head raised (elevated).  Make sure  to get enough sleep each night. General instructions  Apply a warm, moist washcloth to your face 3-4 times a day or as told by your health care provider. This will help with discomfort.  Wash your hands often with soap and water to reduce your exposure to viruses and other germs. If soap and water are not available, use hand sanitizer.  Do not smoke. Avoid being around people who are smoking (secondhand smoke).  Keep all follow-up visits as told by your health care provider. This is important. Contact a health care provider if:  You have a fever.  Your symptoms get worse.  Your symptoms do not improve within 10 days. Get help right away if:  You have a severe headache.  You have persistent vomiting.  You have pain or swelling around your face or eyes.  You have vision problems.  You develop confusion.  Your neck is stiff.  You have trouble breathing. This information is not intended to replace advice given to you by your health care provider. Make sure you discuss any questions you have with your health care  provider. Document Released: 11/01/2005 Document Revised: 06/27/2016 Document Reviewed: 08/27/2015 Elsevier Interactive Patient Education  2018 [employer]. Pharyngitis Pharyngitis is redness, pain, and swelling (inflammation) of the throat (pharynx). It is a very common cause of sore throat. Pharyngitis can be caused by a bacteria, but it is usually caused by a virus. Most cases of pharyngitis get better on their own without treatment. What are the causes? This condition may be caused by:  Infection by viruses (viral). Viral pharyngitis spreads from person to person (is contagious) through coughing, sneezing, and sharing of personal items or utensils such as cups, forks, spoons, and toothbrushes.  Infection by bacteria (bacterial). Bacterial pharyngitis may be spread by touching the nose or face after coming in contact with the bacteria, or through more intimate contact, such as kissing.  Allergies. Allergies can cause buildup of mucus in the throat (post-nasal drip), leading to inflammation and irritation. Allergies can also cause blocked nasal passages, forcing breathing through the mouth, which dries and irritates the throat.  What increases the risk? You are more likely to develop this condition if:  You are 32-23 years old.  You are exposed to crowded environments such as daycare, school, or dormitory living.  You live in a cold climate.  You have a weakened disease-fighting (immune) system.  What are the signs or symptoms? Symptoms of this condition vary by the cause (viral, bacterial, or allergies) and can include:  Sore throat.  Fatigue.  Low-grade fever.  Headache.  Joint pain and muscle aches.  Skin rashes.  Swollen glands in the throat (lymph nodes).  Plaque-like film on the throat or tonsils. This is often a symptom of bacterial pharyngitis.  Vomiting.  Stuffy nose (nasal congestion).  Cough.  Red, itchy eyes (conjunctivitis).  Loss of  appetite.  How is this diagnosed? This condition is often diagnosed based on your medical history and a physical exam. Your health care provider will ask you questions about your illness and your symptoms. A swab of your throat may be done to check for bacteria (rapid strep test). Other lab tests may also be done, depending on the suspected cause, but these are rare. How is this treated? This condition usually gets better in 3-4 days without medicine. Bacterial pharyngitis may be treated with antibiotic medicines. Follow these instructions at home:  Take over-the-counter and prescription medicines only as told by your health care  provider. ? If you were prescribed an antibiotic medicine, take it as told by your health care provider. Do not stop taking the antibiotic even if you start to feel better. ? Do not give children aspirin because of the association with Reye syndrome.  Drink enough water and fluids to keep your urine clear or pale yellow.  Get a lot of rest.  Gargle with a salt-water mixture 3-4 times a day or as needed. To make a salt-water mixture, completely dissolve -1 tsp of salt in 1 cup of warm water.  If your health care provider approves, you may use throat lozenges or sprays to soothe your throat. Contact a health care provider if:  You have large, tender lumps in your neck.  You have a rash.  You cough up green, yellow-brown, or bloody spit. Get help right away if:  Your neck becomes stiff.  You drool or are unable to swallow liquids.  You cannot drink or take medicines without vomiting.  You have severe pain that does not go away, even after you take medicine.  You have trouble breathing, and it is not caused by a stuffy nose.  You have new pain and swelling in your joints such as the knees, ankles, wrists, or elbows. Summary  Pharyngitis is redness, pain, and swelling (inflammation) of the throat (pharynx).  While pharyngitis can be caused by a  bacteria, the most common causes are viral.  Most cases of pharyngitis get better on their own without treatment.  Bacterial pharyngitis is treated with antibiotic medicines. This information is not intended to replace advice given to you by your health care provider. Make sure you discuss any questions you have with your health care provider. Document Released: 11/01/2005 Document Revised: 12/07/2016 Document Reviewed: 12/07/2016 Elsevier Interactive Patient Education  2018 [employer]. Sinus Rinse What is a sinus rinse? A sinus rinse is a simple home treatment that is used to rinse your sinuses with a sterile mixture of salt and water (saline solution). Sinuses are air-filled spaces in your skull behind the bones of your face and forehead that open into your nasal cavity. You will use the following:  Saline solution.  Neti pot or spray bottle. This releases the saline solution into your nose and through your sinuses. Neti pots and spray bottles can be purchased at [employer], a health food store, or online.  When would I do a sinus rinse? A sinus rinse can help to clear mucus, dirt, dust, or pollen from the nasal cavity. You may do a sinus rinse when you have a cold, a virus, nasal allergy symptoms, a sinus infection, or stuffiness in the nose or sinuses. If you are considering a sinus rinse:  Ask your child's health care provider before performing a sinus rinse on your child.  Do not do a sinus rinse if you have had ear or nasal surgery, ear infection, or blocked ears.  How do I do a sinus rinse?  Wash your hands.  Disinfect your device according to the directions provided and then dry it.  Use the solution that comes with your device or one that is sold separately in stores. Follow the mixing directions on the package.  Fill your device with the amount of saline solution as directed by the device instructions.  Stand over a sink and tilt your head sideways over the  sink.  Place the spout of the device in your upper nostril (the one closer to the ceiling).  Gently pour or  squeeze the saline solution into the nasal cavity. The liquid should drain to the lower nostril if you are not overly congested.  Gently blow your nose. Blowing too hard may cause ear pain.  Repeat in the other nostril.  Clean and rinse your device with clean water and then air-dry it. Are there risks of a sinus rinse? Sinus rinse is generally very safe and effective. However, there are a few risks, which include:  A burning sensation in the sinuses. This may happen if you do not make the saline solution as directed. Make sure to follow all directions when making the saline solution.  Infection from contaminated water. This is rare, but possible.  Nasal irritation.  This information is not intended to replace advice given to you by y Otitis Media, Adult Otitis media occurs when there is inflammation and fluid in the middle ear. Your middle ear is a part of the ear that contains bones for hearing as well as air that helps send sounds to your brain. What are the causes? This condition is caused by a blockage in the eustachian tube. This tube drains fluid from the ear to the back of the nose (nasopharynx). A blockage in this tube can be caused by an object or by swelling (edema) in the tube. Problems that can cause a blockage include:  A cold or other upper respiratory infection.  Allergies.  An irritant, such as tobacco smoke.  Enlarged adenoids. The adenoids are areas of soft tissue located high in the back of the throat, behind the nose and the roof of the mouth.  A mass in the nasopharynx.  Damage to the ear caused by pressure changes (barotrauma).  What are the signs or symptoms? Symptoms of this condition include:  Ear pain.  A fever.  Decreased hearing.  A headache.  Tiredness (lethargy).  Fluid leaking from the ear.  Ringing in the ear.  How is this  diagnosed? This condition is diagnosed with a physical exam. During the exam your health care provider will use an instrument called an otoscope to look into your ear and check for redness, swelling, and fluid. He or she will also ask about your symptoms. Your health care provider may also order tests, such as:  A test to check the movement of the eardrum (pneumatic otoscopy). This test is done by squeezing a small amount of air into the ear.  A test that changes air pressure in the middle ear to check how well the eardrum moves and whether the eustachian tube is working (tympanogram).  How is this treated? This condition usually goes away on its own within 3-5 days. But if the condition is caused by a bacteria infection and does not go away own its own, or keeps coming back, your health care provider may:  Prescribe antibiotic medicines to treat the infection.  Prescribe or recommend medicines to control pain.  Follow these instructions at home:  Take over-the-counter and prescription medicines only as told by your health care provider.  If you were prescribed an antibiotic medicine, take it as told by your health care provider. Do not stop taking the antibiotic even if you start to feel better.  Keep all follow-up visits as told by your health care provider. This is important. Contact a health care provider if:  You have bleeding from your nose.  There is a lump on your neck.  You are not getting better in 5 days.  You feel worse instead of better. Get  help right away if:  You have severe pain that is not controlled with medicine.  You have swelling, redness, or pain around your ear.  You have stiffness in your neck.  A part of your face is paralyzed.  The bone behind your ear (mastoid) is tender when you touch it.  You develop a severe headache. Summary  Otitis media is redness, soreness, and swelling of the middle ear.  This condition usually goes away on its own  within 3-5 days.  If the problem does not go away in 3-5 days, your health care provider may prescribe or recommend medicines to treat your symptoms.  If you were prescribed an antibiotic medicine, take it as told by your health care provider. This information is not intended to replace advice given to you by your health care provider. Make sure you discuss any questions you have with your health care provider. Document Released: 08/06/2004 Document Revised: 10/22/2016 Document Reviewed: 10/22/2016 Elsevier Interactive Patient Education  2018 [employer]. our health care provider. Make sure you discuss any questions you have with your health care provider. Document Released: 05/29/2014 Document Revised: 09/28/2016 Document Reviewed: 03/19/2014 Elsevier Interactive Patient Education  2017 [employer].

## 2019-02-01 ENCOUNTER — Ambulatory Visit: Payer: Self-pay | Admitting: Registered Nurse

## 2019-02-01 ENCOUNTER — Encounter: Payer: Self-pay | Admitting: Registered Nurse

## 2019-02-01 ENCOUNTER — Other Ambulatory Visit: Payer: Self-pay

## 2019-02-01 VITALS — BP 117/82 | HR 81 | Temp 98.4°F

## 2019-02-01 DIAGNOSIS — H6691 Otitis media, unspecified, right ear: Secondary | ICD-10-CM

## 2019-02-01 DIAGNOSIS — J019 Acute sinusitis, unspecified: Secondary | ICD-10-CM

## 2019-02-01 MED ORDER — IBUPROFEN 800 MG PO TABS: 800.0000 mg | ORAL_TABLET | Freq: Three times a day (TID) | ORAL | 0 refills | Status: AC | PRN

## 2019-02-01 MED ORDER — LORATADINE 10 MG PO TABS: 10.0000 mg | ORAL_TABLET | Freq: Every day | ORAL | 11 refills | Status: DC

## 2019-02-01 MED ORDER — AMOXICILLIN-POT CLAVULANATE 875-125 MG PO TABS: 1.0000 | ORAL_TABLET | Freq: Two times a day (BID) | ORAL | 0 refills | Status: AC

## 2019-02-01 NOTE — Patient Instructions (Addendum)
Sinusitis, Adult Sinusitis is inflammation of your sinuses. Sinuses are hollow spaces in the bones around your face. Your sinuses are located:  Around your eyes.  In the middle of your forehead.  Behind your nose.  In your cheekbones. Mucus normally drains out of your sinuses. When your nasal tissues become inflamed or swollen, mucus can become trapped or blocked. This allows bacteria, viruses, and fungi to grow, which leads to infection. Most infections of the sinuses are caused by a virus. Sinusitis can develop quickly. It can last for up to 4 weeks (acute) or for more than 12 weeks (chronic). Sinusitis often develops after a cold. What are the causes? This condition is caused by anything that creates swelling in the sinuses or stops mucus from draining. This includes:  Allergies.  Asthma.  Infection from bacteria or viruses.  Deformities or blockages in your nose or sinuses.  Abnormal growths in the nose (nasal polyps).  Pollutants, such as chemicals or irritants in the air.  Infection from fungi (rare). What increases the risk? You are more likely to develop this condition if you:  Have a weak body defense system (immune system).  Do a lot of swimming or diving.  Overuse nasal sprays.  Smoke. What are the signs or symptoms? The main symptoms of this condition are pain and a feeling of pressure around the affected sinuses. Other symptoms include:  Stuffy nose or congestion.  Thick drainage from your nose.  Swelling and warmth over the affected sinuses.  Headache.  Upper toothache.  A cough that may get worse at night.  Extra mucus that collects in the throat or the back of the nose (postnasal drip).  Decreased sense of smell and taste.  Fatigue.  A fever.  Sore throat.  Bad breath. How is this diagnosed? This condition is diagnosed based on:  Your symptoms.  Your medical history.  A physical exam.  Tests to find out if your condition is  acute or chronic. This may include: ? Checking your nose for nasal polyps. ? Viewing your sinuses using a device that has a light (endoscope). ? Testing for allergies or bacteria. ? Imaging tests, such as an MRI or CT scan. In rare cases, a bone biopsy may be done to rule out more serious types of fungal sinus disease. How is this treated? Treatment for sinusitis depends on the cause and whether your condition is chronic or acute.  If caused by a virus, your symptoms should go away on their own within 10 days. You may be given medicines to relieve symptoms. They include: ? Medicines that shrink swollen nasal passages (topical intranasal decongestants). ? Medicines that treat allergies (antihistamines). ? A spray that eases inflammation of the nostrils (topical intranasal corticosteroids). ? Rinses that help get rid of thick mucus in your nose (nasal saline washes).  If caused by bacteria, your health care provider may recommend waiting to see if your symptoms improve. Most bacterial infections will get better without antibiotic medicine. You may be given antibiotics if you have: ? A severe infection. ? A weak immune system.  If caused by narrow nasal passages or nasal polyps, you may need to have surgery. Follow these instructions at home: Medicines  Take, use, or apply over-the-counter and prescription medicines only as told by your health care provider. These may include nasal sprays.  If you were prescribed an antibiotic medicine, take it as told by your health care provider. Do not stop taking the antibiotic even if you start   to feel better. Hydrate and humidify   Drink enough fluid to keep your urine pale yellow. Staying hydrated will help to thin your mucus.  Use a cool mist humidifier to keep the humidity level in your home above 50%.  Inhale steam for 10-15 minutes, 3-4 times a day, or as told by your health care provider. You can do this in the bathroom while a hot shower is  running.  Limit your exposure to cool or dry air. Rest  Rest as much as possible.  Sleep with your head raised (elevated).  Make sure you get enough sleep each night. General instructions   Apply a warm, moist washcloth to your face 3-4 times a day or as told by your health care provider. This will help with discomfort.  Wash your hands often with soap and water to reduce your exposure to germs. If soap and water are not available, use hand sanitizer.  Do not smoke. Avoid being around people who are smoking (secondhand smoke).  Keep all follow-up visits as told by your health care provider. This is important. Contact a health care provider if:  You have a fever.  Your symptoms get worse.  Your symptoms do not improve within 10 days. Get help right away if:  You have a severe headache.  You have persistent vomiting.  You have severe pain or swelling around your face or eyes.  You have vision problems.  You develop confusion.  Your neck is stiff.  You have trouble breathing. Summary  Sinusitis is soreness and inflammation of your sinuses. Sinuses are hollow spaces in the bones around your face.  This condition is caused by nasal tissues that become inflamed or swollen. The swelling traps or blocks the flow of mucus. This allows bacteria, viruses, and fungi to grow, which leads to infection.  If you were prescribed an antibiotic medicine, take it as told by your health care provider. Do not stop taking the antibiotic even if you start to feel better.  Keep all follow-up visits as told by your health care provider. This is important. This information is not intended to replace advice given to you by your health care provider. Make sure you discuss any questions you have with your health care provider. Document Released: 11/01/2005 Document Revised: 04/03/2018 Document Reviewed: 04/03/2018 Elsevier Interactive Patient Education  2019 [employer] Nonallergic  Rhinitis Nonallergic rhinitis is a condition that causes symptoms that affect the nose, such as a runny nose and a stuffed-up nose (nasal congestion) that can make it hard to breathe through the nose. This condition is different from having an allergy (allergic rhinitis). Allergic rhinitis occurs when the body's defense system (immune system) reacts to a substance that you are allergic to (allergen), such as pollen, pet dander, mold, or dust. Nonallergic rhinitis has many similar symptoms, but it is not caused by allergens. Nonallergic rhinitis can be a short-term or long-term problem. What are the causes? This condition can be caused by many different things. Some common types of nonallergic rhinitis include: Infectious rhinitis  This is usually due to an infection in the upper respiratory tract. Vasomotor rhinitis  This is the most common type of long-term nonallergic rhinitis.  It is caused by too much blood flow through the nose, which makes the tissue inside of the nose swell.  Symptoms are often triggered by strong odors, cold air, stress, drinking alcohol, cigarette smoke, or changes in the weather. Occupational rhinitis  This type is caused by triggers in the  workplace, such as chemicals, dusts, animal dander, or air pollution. Hormonal rhinitis  This type occurs in women as a result of an increase in the female hormone estrogen.  It may occur during pregnancy, puberty, and menstrual cycles.  Symptoms improve when estrogen levels drop. Drug-induced rhinitis Several drugs can cause nonallergic rhinitis, including:  Medicines that are used to treat high blood pressure, heart disease, and Parkinson disease.  Aspirin and NSAIDs.  Over-the-counter nasal decongestant sprays. These can cause a type of nonallergic rhinitis (rhinitis medicamentosa) when they are used for more than a few days. Nonallergic rhinitis with eosinophilia syndrome (NARES)  This type is caused by having too  much of a certain type of white blood cell (eosinophil). Nonallergic rhinitis can also be caused by a reaction to eating hot or spicy foods. This does not usually cause long-term symptoms. In some cases, the cause of nonallergic rhinitis is not known. What increases the risk? You are more likely to develop this condition if:  You are 6-65 years of age.  You are a woman. Women are twice as likely to have this condition. What are the signs or symptoms? Common symptoms of this condition include:  Nasal congestion.  Runny nose.  The feeling of mucus going down the back of the throat (postnasal drip).  Trouble sleeping at night and daytime sleepiness. Less common symptoms include:  Sneezing.  Coughing.  Itchy nose.  Bloodshot eyes. How is this diagnosed? This condition may be diagnosed based on:  Your symptoms and medical history.  A physical exam.  Allergy testing to rule out allergic rhinitis. You may have skin tests or blood tests. In some cases, the health care provider may take a swab of nasal secretions to look for an increased number of eosinophils. This would be done to confirm a diagnosis of NARES. How is this treated? Treatment for this condition depends on the cause. No single treatment works for everyone. Work with your health care provider to find the best treatment for you. Treatment may include:  Avoiding the things that trigger your symptoms.  Using medicines to relieve congestion, such as: ? Steroid nasal spray. There are many types. You may need to try a few to find out which one works best. ? Decongestant medicine. This may be an oral medicine or a nasal spray. These medicines are only used for a short time.  Using medicines to relieve a runny nose. These may include antihistamine medicines or anticholinergic nasal sprays.  Surgery to remove tissue from inside the nose may be needed in severe cases if the condition has not improved after 6-12 months of  medical treatment. Follow these instructions at home:  Take or use over-the-counter and prescription medicines only as told by your health care provider. Do not stop using your medicine even if you start to feel better.  Use salt-water (saline) rinses or other solutions (nasal washes or irrigations) to wash or rinse out the inside of your nose as told by your health care provider.  Do not take NSAIDs or medicines that contain aspirin if they make your symptoms worse.  Do not drink alcohol if it makes your symptoms worse.  Do not use any tobacco products, such as cigarettes, chewing tobacco, and e-cigarettes. If you need help quitting, ask your health care provider.  Avoid secondhand smoke.  Get some exercise every day. Exercise may help reduce symptoms of nonallergic rhinitis for some people. Ask your health care provider how much exercise and what types of  exercise are safe for you.  Sleep with the head of your bed raised (elevated). This may reduce nighttime nasal congestion.  Keep all follow-up visits as told by your health care provider. This is important. Contact a health care provider if:  You have a fever.  Your symptoms are getting worse at home.  Your symptoms are not responding to medicine.  You develop new symptoms, especially a headache or nosebleed. This information is not intended to replace advice given to you by your health care provider. Make sure you discuss any questions you have with your health care provider. Document Released: 02/23/2016 Document Revised: 04/08/2016 Document Reviewed: 01/22/2016 Elsevier Interactive Patient Education  2019 [employer] Allergic Rhinitis, Adult Allergic rhinitis is an allergic reaction that affects the mucous membrane inside the nose. It causes sneezing, a runny or stuffy nose, and the feeling of mucus going down the back of the throat (postnasal drip). Allergic rhinitis can be mild to severe. There are two types of allergic  rhinitis:  Seasonal. This type is also called hay fever. It happens only during certain seasons.  Perennial. This type can happen at any time of the year. What are the causes? This condition happens when the body's defense system (immune system) responds to certain harmless substances called allergens as though they were germs.  Seasonal allergic rhinitis is triggered by pollen, which can come from grasses, trees, and weeds. Perennial allergic rhinitis may be caused by:  House dust mites.  Pet dander.  Mold spores. What are the signs or symptoms? Symptoms of this condition include:  Sneezing.  Runny or stuffy nose (nasal congestion).  Postnasal drip.  Itchy nose.  Tearing of the eyes.  Trouble sleeping.  Daytime sleepiness. How is this diagnosed? This condition may be diagnosed based on:  Your medical history.  A physical exam.  Tests to check for related conditions, such as: ? Asthma. ? Pink eye. ? Ear infection. ? Upper respiratory infection.  Tests to find out which allergens trigger your symptoms. These may include skin or blood tests. How is this treated? There is no cure for this condition, but treatment can help control symptoms. Treatment may include:  Taking medicines that block allergy symptoms, such as antihistamines. Medicine may be given as a shot, nasal spray, or pill.  Avoiding the allergen.  Desensitization. This treatment involves getting ongoing shots until your body becomes less sensitive to the allergen. This treatment may be done if other treatments do not help.  If taking medicine and avoiding the allergen does not work, new, stronger medicines may be prescribed. Follow these instructions at home:  Find out what you are allergic to. Common allergens include smoke, dust, and pollen.  Avoid the things you are allergic to. These are some things you can do to help avoid allergens: ? Replace carpet with wood, tile, or vinyl flooring. Carpet  can trap dander and dust. ? Do not smoke. Do not allow smoking in your home. ? Change your heating and air conditioning filter at least once a month. ? During allergy season:  Keep windows closed as much as possible.  Plan outdoor activities when pollen counts are lowest. This is usually during the evening hours.  When coming indoors, change clothing and shower before sitting on furniture or bedding.  Take over-the-counter and prescription medicines only as told by your health care provider.  Keep all follow-up visits as told by your health care provider. This is important. Contact a health care provider if:  You have a fever.  You develop a persistent cough.  You make whistling sounds when you breathe (you wheeze).  Your symptoms interfere with your normal daily activities. Get help right away if:  You have shortness of breath. Summary  This condition can be managed by taking medicines as directed and avoiding allergens.  Contact your health care provider if you develop a persistent cough or fever.  During allergy season, keep windows closed as much as possible. This information is not intended to replace advice given to you by your health care provider. Make sure you discuss any questions you have with your health care provider. Document Released: 07/27/2001 Document Revised: 12/09/2016 Document Reviewed: 12/09/2016 Elsevier Interactive Patient Education  2019 [employer]. How to Perform a Sinus Rinse A sinus rinse is a home treatment that is used to rinse your sinuses with a sterile mixture of salt and water (saline solution). Sinuses are air-filled spaces in your skull behind the bones of your face and forehead that open into your nasal cavity. A sinus rinse can help to clear mucus, dirt, dust, or pollen from your nasal cavity. You may do a sinus rinse when you have a cold, a virus, nasal allergy symptoms, a sinus infection, or stuffiness in your nose or sinuses. Talk  with your health care provider about whether a sinus rinse might help you. What are the risks? A sinus rinse is generally safe and effective. However, there are a few risks, which include:  A burning sensation in your sinuses. This may happen if you do not make the saline solution as directed. Be sure to follow all directions when making the saline solution.  Nasal irritation.  Infection from contaminated water. This is rare, but possible. Do not do a sinus rinse if you have had ear or nasal surgery, ear infection, or blocked ears. Supplies needed:  Saline solution or powder.  Distilled or sterile water may be needed to mix with saline powder. ? You may use boiled and cooled tap water. Boil tap water for 5 minutes; cool until it is lukewarm. Use within 24 hours. ? Do not use regular tap water to mix with the saline solution.  Neti pot or nasal rinse bottle. These supplies release the saline solution into your nose and through your sinuses. Neti pots and nasal rinse bottles can be purchased at [employer], a health food store, or online. How to perform a sinus rinse  1. Wash your hands with soap and water. 2. Wash your device according to the directions that came with the product and then dry it. 3. Use the solution that comes with your product or one that is sold separately in stores. Follow the mixing directions on the package if you need to mix with sterile or distilled water. 4. Fill the device with the amount of saline solution noted in the device instructions. 5. Stand over a sink and tilt your head sideways over the sink. 6. Place the spout of the device in your upper nostril (the one closer to the ceiling). 7. Gently pour or squeeze the saline solution into your nasal cavity. The liquid should drain out from the lower nostril if you are not too congested. 8. While rinsing, breathe through your open mouth. 9. Gently blow your nose to clear any mucus and rinse solution.  Blowing too hard may cause ear pain. 10. Repeat in your other nostril. 11. Clean and rinse your device with clean water and then air-dry it. Talk with  your health care provider or pharmacist if you have questions about how to do a sinus rinse. Summary  A sinus rinse is a home treatment that is used to rinse your sinuses with a sterile mixture of salt and water (saline solution).  A sinus rinse is generally safe and effective. Follow all instructions carefully.  Before doing a sinus rinse, talk with your health care provider about whether it would be helpful for you. This information is not intended to replace advice given to you by your health care provider. Make sure you discuss any questions you have with your health care provider. Document Released: 05/29/2014 Document Revised: 08/29/2017 Document Reviewed: 08/29/2017 Elsevier Interactive Patient Education  2019 [employer]. Otitis Media, Adult  Otitis media occurs when there is inflammation and fluid in the middle ear. Your middle ear is a part of the ear that contains bones for hearing as well as air that helps send sounds to your brain. What are the causes? This condition is caused by a blockage in the eustachian tube. This tube drains fluid from the ear to the back of the nose (nasopharynx). A blockage in this tube can be caused by an object or by swelling (edema) in the tube. Problems that can cause a blockage include:  A cold or other upper respiratory infection.  Allergies.  An irritant, such as tobacco smoke.  Enlarged adenoids. The adenoids are areas of soft tissue located high in the back of the throat, behind the nose and the roof of the mouth.  A mass in the nasopharynx.  Damage to the ear caused by pressure changes (barotrauma). What are the signs or symptoms? Symptoms of this condition include:  Ear pain.  A fever.  Decreased hearing.  A headache.  Tiredness (lethargy).  Fluid leaking from the ear.   Ringing in the ear. How is this diagnosed? This condition is diagnosed with a physical exam. During the exam your health care provider will use an instrument called an otoscope to look into your ear and check for redness, swelling, and fluid. He or she will also ask about your symptoms. Your health care provider may also order tests, such as:  A test to check the movement of the eardrum (pneumatic otoscopy). This test is done by squeezing a small amount of air into the ear.  A test that changes air pressure in the middle ear to check how well the eardrum moves and whether the eustachian tube is working (tympanogram). How is this treated? This condition usually goes away on its own within 3-5 days. But if the condition is caused by a bacteria infection and does not go away own its own, or keeps coming back, your health care provider may:  Prescribe antibiotic medicines to treat the infection.  Prescribe or recommend medicines to control pain. Follow these instructions at home:  Take over-the-counter and prescription medicines only as told by your health care provider.  If you were prescribed an antibiotic medicine, take it as told by your health care provider. Do not stop taking the antibiotic even if you start to feel better.  Keep all follow-up visits as told by your health care provider. This is important. Contact a health care provider if:  You have bleeding from your nose.  There is a lump on your neck.  You are not getting better in 5 days.  You feel worse instead of better. Get help right away if:  You have severe pain that is not controlled with medicine.  You have swelling, redness, or pain around your ear.  You have stiffness in your neck.  A part of your face is paralyzed.  The bone behind your ear (mastoid) is tender when you touch it.  You develop a severe headache. Summary  Otitis media is redness, soreness, and swelling of the middle ear.  This condition  usually goes away on its own within 3-5 days.  If the problem does not go away in 3-5 days, your health care provider may prescribe or recommend medicines to treat your symptoms.  If you were prescribed an antibiotic medicine, take it as told by your health care provider. This information is not intended to replace advice given to you by your health care provider. Make sure you discuss any questions you have with your health care provider. Document Released: 08/06/2004 Document Revised: 10/22/2016 Document Reviewed: 10/22/2016 Elsevier Interactive Patient Education  2019 [employer].

## 2019-02-01 NOTE — Progress Notes (Signed)
Subjective:    Patient ID: Sara Cook, female    DOB: 03/07/1966, 53 y.o.   MRN: [id number]  52y/o established female pt c/o 3-4 days R ear pain without drainage. Pain extends down over R eustachian tube. Last ear infection Dec 2019 augmentin and motrin worked well for patient.  Does not like nose sprays cannot tolerate flonase.     Review of Systems  Constitutional: Negative for activity change, appetite change, chills, diaphoresis, fatigue, fever and unexpected weight change.  HENT: Positive for congestion, ear pain and sinus pressure. Negative for dental problem, drooling, ear discharge, facial swelling, hearing loss, mouth sores, nosebleeds, postnasal drip, rhinorrhea, sinus pain, sneezing, sore throat, tinnitus, trouble swallowing and voice change.   Eyes: Negative for photophobia, pain, discharge, redness, itching and visual disturbance.  Respiratory: Negative for cough, choking, chest tightness, shortness of breath, wheezing and stridor.   Cardiovascular: Negative for chest pain, palpitations and leg swelling.  Gastrointestinal: Negative for abdominal pain, diarrhea, nausea and vomiting.  Endocrine: Negative for cold intolerance and heat intolerance.  Genitourinary: Negative for difficulty urinating, dysuria and hematuria.  Musculoskeletal: Negative for back pain, gait problem, joint swelling, myalgias, neck pain and neck stiffness.  Skin: Negative for color change, pallor, rash and wound.  Allergic/Immunologic: Positive for environmental allergies. Negative for food allergies.  Neurological: Positive for headaches. Negative for dizziness, tremors, seizures, syncope, facial asymmetry, speech difficulty, weakness, light-headedness and numbness.  Hematological: Negative for adenopathy. Does not bruise/bleed easily.  Psychiatric/Behavioral: Negative for agitation, behavioral problems, confusion and sleep disturbance.       Objective:   Physical Exam Vitals signs and nursing  note reviewed.  Constitutional:      General: She is awake. She is not in acute distress.    Appearance: Normal appearance. She is well-developed and well-groomed. She is obese. She is not ill-appearing, toxic-appearing or diaphoretic.  HENT:     Head: Normocephalic and atraumatic.     Jaw: There is normal jaw occlusion. No trismus.     Salivary Glands: Right salivary gland is not diffusely enlarged or tender. Left salivary gland is not diffusely enlarged or tender.     Right Ear: Hearing, ear canal and external ear normal. A middle ear effusion is present. Tympanic membrane is injected and bulging.     Left Ear: Hearing, ear canal and external ear normal. A middle ear effusion is present.     Nose: Mucosal edema and rhinorrhea present. No nasal deformity, septal deviation or laceration.     Right Turbinates: Enlarged and swollen. Not pale.     Left Turbinates: Enlarged and swollen. Not pale.     Right Sinus: Frontal sinus tenderness present. No maxillary sinus tenderness.     Left Sinus: Frontal sinus tenderness present. No maxillary sinus tenderness.     Mouth/Throat:     Lips: Pink. No lesions.     Mouth: Mucous membranes are moist. Mucous membranes are not pale, not dry and not cyanotic. No injury, lacerations, oral lesions or angioedema.     Dentition: Normal dentition. Does not have dentures. No dental caries or dental abscesses.     Tongue: No lesions.     Pharynx: Uvula midline. Pharyngeal swelling and posterior oropharyngeal erythema present. No oropharyngeal exudate or uvula swelling.     Tonsils: No tonsillar exudate or tonsillar abscesses. 0 on the right. 0 on the left.     Comments: Cobblestoning posterior pharynx; bilateral Cowley air fluid level clear right TM erythema 50% bulging; bilatreal allergic  shiners; nasal turbinates edema erythema clear discharge Eyes:     General: Lids are normal. Allergic shiner present. No visual field deficit or scleral icterus.       Right eye: No  foreign body, discharge or hordeolum.        Left eye: No foreign body, discharge or hordeolum.     Extraocular Movements:     Right eye: Normal extraocular motion and no nystagmus.     Left eye: Normal extraocular motion and no nystagmus.     Conjunctiva/sclera: Conjunctivae normal.     Right eye: Right conjunctiva is not injected. No chemosis, exudate or hemorrhage.    Left eye: Left conjunctiva is not injected. No chemosis, exudate or hemorrhage.    Pupils: Pupils are equal, round, and reactive to light. Pupils are equal.     Right eye: Pupil is round and reactive.     Left eye: Pupil is round and reactive.  Neck:     Musculoskeletal: Normal range of motion and neck supple. Normal range of motion. No edema, erythema, neck rigidity, crepitus, pain with movement, torticollis, spinous process tenderness or muscular tenderness.     Thyroid: No thyroid mass or thyromegaly.     Trachea: Trachea and phonation normal. No tracheal tenderness or tracheal deviation.  Cardiovascular:     Rate and Rhythm: Normal rate and regular rhythm.     Chest Wall: PMI is not displaced.     Heart sounds: Normal heart sounds, S1 normal and S2 normal. No murmur. No friction rub. No gallop.   Pulmonary:     Effort: Pulmonary effort is normal. No accessory muscle usage or respiratory distress.     Breath sounds: Normal breath sounds and air entry. No stridor, decreased air movement or transmitted upper airway sounds. No decreased breath sounds, wheezing, rhonchi or rales.     Comments: No cough observed in exam room; spoke full sentences without difficulty Chest:     Chest wall: No tenderness.  Abdominal:     General: There is no distension.     Palpations: Abdomen is soft.  Musculoskeletal: Normal range of motion.        General: No tenderness.     Right shoulder: Normal.     Left shoulder: Normal.     Right elbow: Normal.    Left elbow: Normal.     Right hip: Normal.     Left hip: Normal.     Right knee:  Normal.     Left knee: Normal.     Cervical back: Normal.     Thoracic back: Normal.     Lumbar back: Normal.     Right hand: Normal.     Left hand: Normal.  Lymphadenopathy:     Head:     Right side of head: No submental, submandibular, tonsillar, preauricular, posterior auricular or occipital adenopathy.     Left side of head: No submental, submandibular, tonsillar, preauricular, posterior auricular or occipital adenopathy.     Cervical: No cervical adenopathy.     Right cervical: No superficial, deep or posterior cervical adenopathy.    Left cervical: No superficial, deep or posterior cervical adenopathy.  Skin:    General: Skin is warm and dry.     Capillary Refill: Capillary refill takes less than 2 seconds.     Coloration: Skin is not ashen, cyanotic, jaundiced, mottled, pale or sallow.     Findings: No abrasion, abscess, acne, bruising, burn, ecchymosis, erythema, signs of injury, laceration, lesion, petechiae, rash  or wound.     Nails: There is no clubbing.   Neurological:     General: No focal deficit present.     Mental Status: She is alert and oriented to person, place, and time. She is not disoriented.     GCS: GCS eye subscore is 4. GCS verbal subscore is 5. GCS motor subscore is 6.     Cranial Nerves: Cranial nerves are intact. No cranial nerve deficit, dysarthria or facial asymmetry.     Sensory: Sensation is intact. No sensory deficit.     Motor: Motor function is intact. No weakness, tremor, atrophy, abnormal muscle tone or seizure activity.     Coordination: Coordination is intact. Coordination normal.     Gait: Gait is intact. Gait normal.     Comments: Gait sure and steady in exam room; in/out of chair and on/off exam table without difficulty  Psychiatric:        Attention and Perception: Attention and perception normal.        Mood and Affect: Mood and affect normal.        Speech: Speech normal.        Behavior: Behavior normal. Behavior is cooperative.         Thought Content: Thought content normal.        Cognition and Memory: Cognition and memory normal.        Judgment: Judgment normal.           Assessment & Plan:  A-acute rhinosinusitis, right otitis media recurrent  P-phenylephrine 5-10mg  po q6h prn rhinitis OTC.  Patient may use normal saline nasal spray 2 sprays each nostril q2h wa as needed. recommended flonase 63mcg 1 spray each nostril BID but patient refused.  Patient denied personal or family history of ENT cancer.  OTC antihistamine of choice claritin 10mg  po daily.  Avoid triggers if possible.  Shower prior to bedtime if exposed to triggers.  If allergic dust/dust mites recommend mattress/pillow covers/encasements; washing linens, vacuuming, sweeping, dusting weekly.  Call or return to clinic as needed if these symptoms worsen or fail to improve as anticipated.   Exitcare handout on nonallergic/allergic rhinitis, sinusitis and sinus rinse.  Patient verbalized understanding of instructions, agreed with plan of care and had no further questions at this time.   Supportive treatment. Augmentin 875mg  po BID x 10 days #20 RF0 dispensed from [facility] to patient  Tylenol 1000mg  po QID prn pain/fever.  Motrin 800mg  po TID prn pain #30 RF0 dispensed from [facility] to patient.  No evidence of invasive bacterial infection, non toxic and well hydrated.  This is most likely self limiting viral infection.  I do not see where any further testing or imaging is necessary at this time.   I will suggest supportive care, rest, good hygiene and encourage the patient to take adequate fluids.  The patient is to return to clinic or EMERGENCY ROOM if symptoms worsen or change significantly e.g. ear pain, fever, purulent discharge from ears or bleeding.  Exitcare handout on otitis media   Patient verbalized agreement and understanding of treatment plan.   P2:  Avoidance and hand washing.

## 2019-08-30 ENCOUNTER — Ambulatory Visit: Payer: Self-pay | Admitting: Registered Nurse

## 2019-08-30 ENCOUNTER — Other Ambulatory Visit: Payer: Self-pay

## 2019-08-30 ENCOUNTER — Encounter: Payer: Self-pay | Admitting: Registered Nurse

## 2019-08-30 VITALS — BP 126/90 | HR 81 | Temp 98.7°F

## 2019-08-30 DIAGNOSIS — H6691 Otitis media, unspecified, right ear: Secondary | ICD-10-CM

## 2019-08-30 DIAGNOSIS — J301 Allergic rhinitis due to pollen: Secondary | ICD-10-CM

## 2019-08-30 DIAGNOSIS — H6982 Other specified disorders of Eustachian tube, left ear: Secondary | ICD-10-CM

## 2019-08-30 DIAGNOSIS — H6992 Unspecified Eustachian tube disorder, left ear: Secondary | ICD-10-CM

## 2019-08-30 MED ORDER — AMOXICILLIN-POT CLAVULANATE 875-125 MG PO TABS: 1.0000 | ORAL_TABLET | Freq: Two times a day (BID) | ORAL | 0 refills | Status: DC

## 2019-08-30 MED ORDER — SALINE SPRAY 0.65 % NA SOLN: 2.0000 | NASAL | 0 refills | Status: DC

## 2019-08-30 MED ORDER — PHENYLEPHRINE HCL 5 MG PO TABS: 5.0000 mg | ORAL_TABLET | Freq: Four times a day (QID) | ORAL | Status: AC | PRN

## 2019-08-30 NOTE — Progress Notes (Signed)
Subjective:    Patient ID: Sara Cook, female    DOB: 12-27-1965, 53 y.o.   MRN: [id number]  53y/o caucasian female established patient c/o left ear throbbing and side of neck pain started a couple days ago; really hurting this morning when she got to work 100/10.  Has noticed some post nasal drip and congestion but not taking anything for it.  Stopped her flonase/claritin/nasal saline after her flare subsided spring.  Denied fever/chills/ear discharge/bleeding/headache/body aches/sinus pain/pressure/sore throat or cough.     Review of Systems  Constitutional: Negative for activity change, appetite change, chills, diaphoresis, fatigue, fever and unexpected weight change.  HENT: Positive for congestion, ear pain and postnasal drip. Negative for dental problem, drooling, ear discharge, facial swelling, hearing loss, mouth sores, nosebleeds, rhinorrhea, sinus pressure, sinus pain, sneezing, sore throat, tinnitus, trouble swallowing and voice change.   Eyes: Negative for photophobia, pain, discharge, redness, itching and visual disturbance.  Respiratory: Negative for cough, choking, chest tightness, shortness of breath, wheezing and stridor.   Cardiovascular: Negative for chest pain, palpitations and leg swelling.  Gastrointestinal: Negative for diarrhea, nausea and vomiting.  Endocrine: Negative for cold intolerance and heat intolerance.  Genitourinary: Negative for difficulty urinating.  Musculoskeletal: Positive for neck pain. Negative for arthralgias, back pain, gait problem, joint swelling, myalgias and neck stiffness.  Skin: Negative for color change, pallor, rash and wound.  Allergic/Immunologic: Positive for environmental allergies. Negative for food allergies.  Neurological: Negative for dizziness, tremors, seizures, syncope, facial asymmetry, speech difficulty, weakness, light-headedness, numbness and headaches.  Hematological: Negative for adenopathy. Does not bruise/bleed easily.   Psychiatric/Behavioral: Negative for agitation, confusion and sleep disturbance.       Objective:   Physical Exam Vitals signs reviewed.  Constitutional:      General: She is awake. She is not in acute distress.    Appearance: Normal appearance. She is well-developed and well-groomed. She is obese. She is not ill-appearing, toxic-appearing or diaphoretic.  HENT:     Head: Normocephalic and atraumatic.     Jaw: There is normal jaw occlusion.     Salivary Glands: Right salivary gland is not diffusely enlarged or tender. Left salivary gland is not diffusely enlarged or tender.     Right Ear: Hearing, ear canal and external ear normal. A middle ear effusion is present. There is no impacted cerumen. Tympanic membrane is injected, erythematous and bulging.     Left Ear: Hearing, ear canal and external ear normal. A middle ear effusion is present. There is no impacted cerumen.     Nose: Congestion present. No rhinorrhea.     Right Sinus: No maxillary sinus tenderness or frontal sinus tenderness.     Left Sinus: No maxillary sinus tenderness or frontal sinus tenderness.     Mouth/Throat:     Lips: Pink. No lesions.     Mouth: Mucous membranes are moist.     Tongue: No lesions.     Palate: No mass and lesions.     Pharynx: Uvula midline. Pharyngeal swelling and posterior oropharyngeal erythema present. No oropharyngeal exudate.     Tonsils: No tonsillar exudate.  Eyes:     General: Lids are normal. Vision grossly intact. Gaze aligned appropriately. Allergic shiner present. No visual field deficit or scleral icterus.       Right eye: No discharge.        Left eye: No discharge.     Extraocular Movements: Extraocular movements intact.     Conjunctiva/sclera: Conjunctivae normal.  Pupils: Pupils are equal, round, and reactive to light.  Neck:     Musculoskeletal: Normal range of motion and neck supple. Normal range of motion. Pain with movement present. No edema, erythema, neck rigidity,  crepitus, injury, torticollis, spinous process tenderness or muscular tenderness.     Thyroid: No thyroid mass, thyromegaly or thyroid tenderness.     Vascular: No carotid bruit.     Trachea: Trachea and phonation normal.      Comments: TTP soft tissue from angle of ear to below angle of jaw following eustachian tubes; no shotty lymph nodes; full AROM c-spine but pain with bilateral lateral bending and rotation sides of neck anteriolateral soft tissues; paraspinals and trapezious muscles bilaterally not TTP Cardiovascular:     Rate and Rhythm: Normal rate and regular rhythm.     Pulses: Normal pulses.          Radial pulses are 2+ on the right side and 2+ on the left side.     Heart sounds: Normal heart sounds.  Pulmonary:     Effort: Pulmonary effort is normal. No respiratory distress.     Breath sounds: Normal breath sounds and air entry. No stridor, decreased air movement or transmitted upper airway sounds. No decreased breath sounds, wheezing, rhonchi or rales.     Comments: Spoke full sentences without difficulty; no cough observed in exam room; wearing cloth mask due to covid 19 pandemic Abdominal:     General: Abdomen is flat.     Palpations: Abdomen is soft.  Musculoskeletal: Normal range of motion.        General: No swelling, deformity or signs of injury.     Right shoulder: Normal.     Left shoulder: Normal.     Right elbow: Normal.    Left elbow: Normal.     Right hip: Normal.     Left hip: Normal.     Right knee: Normal.     Left knee: Normal.     Cervical back: She exhibits tenderness and pain. She exhibits normal range of motion, no bony tenderness, no swelling, no edema, no deformity, no laceration, no spasm and normal pulse.     Thoracic back: Normal.     Lumbar back: Normal.       Back:     Right hand: Normal.     Left hand: Normal.     Right lower leg: No edema.     Left lower leg: No edema.  Lymphadenopathy:     Head:     Right side of head: No submental,  submandibular, tonsillar, preauricular, posterior auricular or occipital adenopathy.     Left side of head: No submental, submandibular, tonsillar, preauricular, posterior auricular or occipital adenopathy.     Cervical: No cervical adenopathy.     Right cervical: No superficial, deep or posterior cervical adenopathy.    Left cervical: No superficial, deep or posterior cervical adenopathy.  Skin:    General: Skin is warm and dry.     Capillary Refill: Capillary refill takes less than 2 seconds.     Coloration: Skin is not ashen, cyanotic, jaundiced, mottled, pale or sallow.     Findings: No abrasion, abscess, acne, bruising, burn, ecchymosis, erythema, signs of injury, laceration, lesion, petechiae, rash or wound.     Nails: There is no clubbing.   Neurological:     General: No focal deficit present.     Mental Status: She is alert and oriented to person, place, and time. Mental status  is at baseline.     GCS: GCS eye subscore is 4. GCS verbal subscore is 5. GCS motor subscore is 6.     Cranial Nerves: Cranial nerves are intact. No cranial nerve deficit, dysarthria or facial asymmetry.     Sensory: Sensation is intact. No sensory deficit.     Motor: Motor function is intact. No weakness, tremor, atrophy, abnormal muscle tone or seizure activity.     Coordination: Coordination is intact. Coordination normal.     Gait: Gait is intact. Gait normal.     Comments: Gait sure and steady in hallway; in/out of chair without difficulty; bilateral hand grasp equal 5/5  Psychiatric:        Attention and Perception: Attention and perception normal.        Mood and Affect: Mood and affect normal.        Speech: Speech normal.        Behavior: Behavior normal. Behavior is cooperative.        Thought Content: Thought content normal.        Cognition and Memory: Cognition and memory normal.        Judgment: Judgment normal.    Right TM erythema/injected bulging and left TM air fluid level clear;  cobblestoning posterior pharynx; bilateral allergic shiners; palpation over left eustachian tube TTP       Assessment & Plan:  A-acute right otitis media and left eustachian tube dysfunction and seasonal allergic rhinitis  P-Supportive treatment. Augmentin 875mg  po BID x 10 days #20 RF0 dispensed from [facility] to patient  Motrin 800mg  po TID prn pain take with food at home.  Given 1 UD from clinic stock to take at work today.   No evidence of invasive bacterial infection, non toxic and well hydrated.  I do not see where any further testing or imaging is necessary at this time.   I will suggest supportive care, rest, good hygiene and encourage the patient to take adequate fluids.  The patient is to return to clinic or EMERGENCY ROOM if symptoms worsen or change significantly e.g. ear pain, fever, purulent discharge from ears or bleeding.  Exitcare handout on otitis media and eustachian tube dysfunction.  Discussed with patient post nasal drip irritates throat/causes swelling blocks eustachian tubes and fluid fills up middle ear.  Bacteria/viruses can grow in fluid and with moving head tube compressed and increases pressure in tube/ear worsening pain.  Studies show will take 30 days for fluid to resolve after post nasal drip controlled with nasal steroid/antihistamine.   Patient verbalized agreement and understanding of treatment plan and had no further questions at this time.  P2:  Hand washing   Given 4 UD phenylephrine 5mg  po q6h prn rhinitis.  Patient may use normal saline nasal spray 2 sprays each nostril q2h wa as needed. flonase 6mcg 1 spray each nostril BID OTC.  Patient denied personal or family history of ENT cancer.  OTC antihistamine of choice claritin 10mg  po daily.  Avoid triggers if possible.  Shower prior to bedtime if exposed to triggers.  If allergic dust/dust mites recommend mattress/pillow covers/encasements; washing linens, vacuuming, sweeping, dusting weekly.  Call or return to clinic as  needed if these symptoms worsen or fail to improve as anticipated.   Exitcare handout on allergic rhinitis and sinus rinse.  Patient verbalized understanding of instructions, agreed with plan of care and had no further questions at this time.  P2:  Avoidance and hand washing.  Patient refused flu vaccination today; discussed  flu season has started and I do recommend vaccination.  Discussed if she changes mind can return to schedule with RN Hildred Alamin.  Patient verbalized understanding and had no further questions at this time.

## 2019-08-30 NOTE — Patient Instructions (Signed)
Allergic Rhinitis, Adult Allergic rhinitis is an allergic reaction that affects the mucous membrane inside the nose. It causes sneezing, a runny or stuffy nose, and the feeling of mucus going down the back of the throat (postnasal drip). Allergic rhinitis can be mild to severe. There are two types of allergic rhinitis:  Seasonal. This type is also called hay fever. It happens only during certain seasons.  Perennial. This type can happen at any time of the year. What are the causes? This condition happens when the body's defense system (immune system) responds to certain harmless substances called allergens as though they were germs.  Seasonal allergic rhinitis is triggered by pollen, which can come from grasses, trees, and weeds. Perennial allergic rhinitis may be caused by:  House dust mites.  Pet dander.  Mold spores. What are the signs or symptoms? Symptoms of this condition include:  Sneezing.  Runny or stuffy nose (nasal congestion).  Postnasal drip.  Itchy nose.  Tearing of the eyes.  Trouble sleeping.  Daytime sleepiness. How is this diagnosed? This condition may be diagnosed based on:  Your medical history.  A physical exam.  Tests to check for related conditions, such as: ? Asthma. ? Pink eye. ? Ear infection. ? Upper respiratory infection.  Tests to find out which allergens trigger your symptoms. These may include skin or blood tests. How is this treated? There is no cure for this condition, but treatment can help control symptoms. Treatment may include:  Taking medicines that block allergy symptoms, such as antihistamines. Medicine may be given as a shot, nasal spray, or pill.  Avoiding the allergen.  Desensitization. This treatment involves getting ongoing shots until your body becomes less sensitive to the allergen. This treatment may be done if other treatments do not help.  If taking medicine and avoiding the allergen does not work, new, stronger  medicines may be prescribed. Follow these instructions at home:  Find out what you are allergic to. Common allergens include smoke, dust, and pollen.  Avoid the things you are allergic to. These are some things you can do to help avoid allergens: ? Replace carpet with wood, tile, or vinyl flooring. Carpet can trap dander and dust. ? Do not smoke. Do not allow smoking in your home. ? Change your heating and air conditioning filter at least once a month. ? During allergy season:  Keep windows closed as much as possible.  Plan outdoor activities when pollen counts are lowest. This is usually during the evening hours.  When coming indoors, change clothing and shower before sitting on furniture or bedding.  Take over-the-counter and prescription medicines only as told by your health care provider.  Keep all follow-up visits as told by your health care provider. This is important. Contact a health care provider if:  You have a fever.  You develop a persistent cough.  You make whistling sounds when you breathe (you wheeze).  Your symptoms interfere with your normal daily activities. Get help right away if:  You have shortness of breath. Summary  This condition can be managed by taking medicines as directed and avoiding allergens.  Contact your health care provider if you develop a persistent cough or fever.  During allergy season, keep windows closed as much as possible. This information is not intended to replace advice given to you by your health care provider. Make sure you discuss any questions you have with your health care provider. Document Released: 07/27/2001 Document Revised: 10/14/2017 Document Reviewed: 12/09/2016 Elsevier Patient  Education  [employer]. Otitis Media, Adult  Otitis media occurs when there is inflammation and fluid in the middle ear. Your middle ear is a part of the ear that contains bones for hearing as well as air that helps send sounds to  your brain. What are the causes? This condition is caused by a blockage in the eustachian tube. This tube drains fluid from the ear to the back of the nose (nasopharynx). A blockage in this tube can be caused by an object or by swelling (edema) in the tube. Problems that can cause a blockage include:  A cold or other upper respiratory infection.  Allergies.  An irritant, such as tobacco smoke.  Enlarged adenoids. The adenoids are areas of soft tissue located high in the back of the throat, behind the nose and the roof of the mouth.  A mass in the nasopharynx.  Damage to the ear caused by pressure changes (barotrauma). What are the signs or symptoms? Symptoms of this condition include:  Ear pain.  A fever.  Decreased hearing.  A headache.  Tiredness (lethargy).  Fluid leaking from the ear.  Ringing in the ear. How is this diagnosed? This condition is diagnosed with a physical exam. During the exam your health care provider will use an instrument called an otoscope to look into your ear and check for redness, swelling, and fluid. He or she will also ask about your symptoms. Your health care provider may also order tests, such as:  A test to check the movement of the eardrum (pneumatic otoscopy). This test is done by squeezing a small amount of air into the ear.  A test that changes air pressure in the middle ear to check how well the eardrum moves and whether the eustachian tube is working (tympanogram). How is this treated? This condition usually goes away on its own within 3-5 days. But if the condition is caused by a bacteria infection and does not go away own its own, or keeps coming back, your health care provider may:  Prescribe antibiotic medicines to treat the infection.  Prescribe or recommend medicines to control pain. Follow these instructions at home:  Take over-the-counter and prescription medicines only as told by your health care provider.  If you were  prescribed an antibiotic medicine, take it as told by your health care provider. Do not stop taking the antibiotic even if you start to feel better.  Keep all follow-up visits as told by your health care provider. This is important. Contact a health care provider if:  You have bleeding from your nose.  There is a lump on your neck.  You are not getting better in 5 days.  You feel worse instead of better. Get help right away if:  You have severe pain that is not controlled with medicine.  You have swelling, redness, or pain around your ear.  You have stiffness in your neck.  A part of your face is paralyzed.  The bone behind your ear (mastoid) is tender when you touch it.  You develop a severe headache. Summary  Otitis media is redness, soreness, and swelling of the middle ear.  This condition usually goes away on its own within 3-5 days.  If the problem does not go away in 3-5 days, your health care provider may prescribe or recommend medicines to treat your symptoms.  If you were prescribed an antibiotic medicine, take it as told by your health care provider. This information is not intended to  replace advice given to you by your health care provider. Make sure you discuss any questions you have with your health care provider. Document Released: 08/06/2004 Document Revised: 10/14/2017 Document Reviewed: 10/22/2016 Elsevier Patient Education  2020 [employer]. How to Perform a Sinus Rinse A sinus rinse is a home treatment that is used to rinse your sinuses with a sterile mixture of salt and water (saline solution). Sinuses are air-filled spaces in your skull behind the bones of your face and forehead that open into your nasal cavity. A sinus rinse can help to clear mucus, dirt, dust, or pollen from your nasal cavity. You may do a sinus rinse when you have a cold, a virus, nasal allergy symptoms, a sinus infection, or stuffiness in your nose or sinuses. Talk with your health  care provider about whether a sinus rinse might help you. What are the risks? A sinus rinse is generally safe and effective. However, there are a few risks, which include:  A burning sensation in your sinuses. This may happen if you do not make the saline solution as directed. Be sure to follow all directions when making the saline solution.  Nasal irritation.  Infection from contaminated water. This is rare, but possible. Do not do a sinus rinse if you have had ear or nasal surgery, ear infection, or blocked ears. Supplies needed:  Saline solution or powder.  Distilled or sterile water may be needed to mix with saline powder. ? You may use boiled and cooled tap water. Boil tap water for 5 minutes; cool until it is lukewarm. Use within 24 hours. ? Do not use regular tap water to mix with the saline solution.  Neti pot or nasal rinse bottle. These supplies release the saline solution into your nose and through your sinuses. Neti pots and nasal rinse bottles can be purchased at [employer], a health food store, or online. How to perform a sinus rinse  1. Wash your hands with soap and water. 2. Wash your device according to the directions that came with the product and then dry it. 3. Use the solution that comes with your product or one that is sold separately in stores. Follow the mixing directions on the package if you need to mix with sterile or distilled water. 4. Fill the device with the amount of saline solution noted in the device instructions. 5. Stand over a sink and tilt your head sideways over the sink. 6. Place the spout of the device in your upper nostril (the one closer to the ceiling). 7. Gently pour or squeeze the saline solution into your nasal cavity. The liquid should drain out from the lower nostril if you are not too congested. 8. While rinsing, breathe through your open mouth. 9. Gently blow your nose to clear any mucus and rinse solution. Blowing too hard may  cause ear pain. 10. Repeat in your other nostril. 11. Clean and rinse your device with clean water and then air-dry it. Talk with your health care provider or pharmacist if you have questions about how to do a sinus rinse. Summary  A sinus rinse is a home treatment that is used to rinse your sinuses with a sterile mixture of salt and water (saline solution).  A sinus rinse is generally safe and effective. Follow all instructions carefully.  Before doing a sinus rinse, talk with your health care provider about whether it would be helpful for you. This information is not intended to replace advice given to you  by your health care provider. Make sure you discuss any questions you have with your health care provider. Document Released: 05/29/2014 Document Revised: 08/29/2017 Document Reviewed: 08/29/2017 Elsevier Patient Education  2020 [employer]. Eustachian Tube Dysfunction  Eustachian tube dysfunction refers to a condition in which a blockage develops in the narrow passage that connects the middle ear to the back of the nose (eustachian tube). The eustachian tube regulates air pressure in the middle ear by letting air move between the ear and nose. It also helps to drain fluid from the middle ear space. Eustachian tube dysfunction can affect one or both ears. When the eustachian tube does not function properly, air pressure, fluid, or both can build up in the middle ear. What are the causes? This condition occurs when the eustachian tube becomes blocked or cannot open normally. Common causes of this condition include:  Ear infections.  Colds and other infections that affect the nose, mouth, and throat (upper respiratory tract).  Allergies.  Irritation from cigarette smoke.  Irritation from stomach acid coming up into the esophagus (gastroesophageal reflux). The esophagus is the tube that carries food from the mouth to the stomach.  Sudden changes in air pressure, such as from  descending in an airplane or scuba diving.  Abnormal growths in the nose or throat, such as: ? Growths that line the nose (nasal polyps). ? Abnormal growth of cells (tumors). ? Enlarged tissue at the back of the throat (adenoids). What increases the risk? You are more likely to develop this condition if:  You smoke.  You are overweight.  You are a child who has: ? Certain birth defects of the mouth, such as cleft palate. ? Large tonsils or adenoids. What are the signs or symptoms? Common symptoms of this condition include:  A feeling of fullness in the ear.  Ear pain.  Clicking or popping noises in the ear.  Ringing in the ear.  Hearing loss.  Loss of balance.  Dizziness. Symptoms may get worse when the air pressure around you changes, such as when you travel to an area of high elevation, fly on an airplane, or go scuba diving. How is this diagnosed? This condition may be diagnosed based on:  Your symptoms.  A physical exam of your ears, nose, and throat.  Tests, such as those that measure: ? The movement of your eardrum (tympanogram). ? Your hearing (audiometry). How is this treated? Treatment depends on the cause and severity of your condition.  In mild cases, you may relieve your symptoms by moving air into your ears. This is called "popping the ears."  In more severe cases, or if you have symptoms of fluid in your ears, treatment may include: ? Medicines to relieve congestion (decongestants). ? Medicines that treat allergies (antihistamines). ? Nasal sprays or ear drops that contain medicines that reduce swelling (steroids). ? A procedure to drain the fluid in your eardrum (myringotomy). In this procedure, a small tube is placed in the eardrum to:  Drain the fluid.  Restore the air in the middle ear space. ? A procedure to insert a balloon device through the nose to inflate the opening of the eustachian tube (balloon dilation). Follow these instructions  at home: Lifestyle  Do not do any of the following until your health care provider approves: ? Travel to high altitudes. ? Fly in airplanes. ? Work in a [employer] or room. ? Scuba dive.  Do not use any products that contain nicotine or tobacco, such as  cigarettes and e-cigarettes. If you need help quitting, ask your health care provider.  Keep your ears dry. Wear fitted earplugs during showering and bathing. Dry your ears completely after. General instructions  Take over-the-counter and prescription medicines only as told by your health care provider.  Use techniques to help pop your ears as recommended by your health care provider. These may include: ? Chewing gum. ? Yawning. ? Frequent, forceful swallowing. ? Closing your mouth, holding your nose closed, and gently blowing as if you are trying to blow air out of your nose.  Keep all follow-up visits as told by your health care provider. This is important. Contact a health care provider if:  Your symptoms do not go away after treatment.  Your symptoms come back after treatment.  You are unable to pop your ears.  You have: ? A fever. ? Pain in your ear. ? Pain in your head or neck. ? Fluid draining from your ear.  Your hearing suddenly changes.  You become very dizzy.  You lose your balance. Summary  Eustachian tube dysfunction refers to a condition in which a blockage develops in the eustachian tube.  It can be caused by ear infections, allergies, inhaled irritants, or abnormal growths in the nose or throat.  Symptoms include ear pain, hearing loss, or ringing in the ears.  Mild cases are treated with maneuvers to unblock the ears, such as yawning or ear popping.  Severe cases are treated with medicines. Surgery may also be done (rare). This information is not intended to replace advice given to you by your health care provider. Make sure you discuss any questions you have with your health care provider.  Document Released: 11/28/2015 Document Revised: 02/21/2018 Document Reviewed: 02/21/2018 Elsevier Patient Education  2020 [employer].

## 2020-02-14 ENCOUNTER — Other Ambulatory Visit: Payer: Self-pay

## 2020-02-14 ENCOUNTER — Encounter: Payer: Self-pay | Admitting: Registered Nurse

## 2020-02-14 ENCOUNTER — Ambulatory Visit: Payer: Self-pay | Admitting: Registered Nurse

## 2020-02-14 VITALS — BP 132/86 | HR 103 | Temp 98.5°F

## 2020-02-14 DIAGNOSIS — M62838 Other muscle spasm: Secondary | ICD-10-CM

## 2020-02-14 NOTE — Patient Instructions (Signed)
Muscle Cramps and Spasms Muscle cramps and spasms occur when a muscle or muscles tighten and you have no control over this tightening (involuntary muscle contraction). They are a common problem and can develop in any muscle. The most common place is in the calf muscles of the leg. Muscle cramps and muscle spasms are both involuntary muscle contractions, but there are some differences between the two:  Muscle cramps are painful. They come and go and may last for a few seconds or up to 15 minutes. Muscle cramps are often more forceful and last longer than muscle spasms.  Muscle spasms may or may not be painful. They may also last just a few seconds or much longer. Certain medical conditions, such as diabetes or Parkinson's disease, can make it more likely to develop cramps or spasms. However, cramps or spasms are usually not caused by a serious underlying problem. Common causes include:  Doing more physical work or exercise than your body is ready for (overexertion).  Overuse from repeating certain movements too many times.  Remaining in a certain position for a long period of time.  Improper preparation, form, or technique while playing a sport or doing an activity.  Dehydration.  Injury.  Side effects of some medicines.  Abnormally low levels of the salts and minerals in your blood (electrolytes), especially potassium and calcium. This could happen if you are taking water pills (diuretics) or if you are pregnant. In many cases, the cause of muscle cramps or spasms is not known. Follow these instructions at home: Managing pain and stiffness      Try massaging, stretching, and relaxing the affected muscle. Do this for several minutes at a time.  If directed, apply heat to tight or tense muscles as often as told by your health care provider. Use the heat source that your health care provider recommends, such as a moist heat pack or a heating pad. ? Place a towel between your skin and  the heat source. ? Leave the heat on for 20-30 minutes. ? Remove the heat if your skin turns bright red. This is especially important if you are unable to feel pain, heat, or cold. You may have a greater risk of getting burned.  If directed, put ice on the affected area. This may help if you are sore or have pain after a cramp or spasm. ? Put ice in a plastic bag. ? Place a towel between your skin and the bag. ? Leavethe ice on for 20 minutes, 2-3 times a day.  Try taking hot showers or baths to help relax tight muscles. Eating and drinking  Drink enough fluid to keep your urine pale yellow. Staying well hydrated may help prevent cramps or spasms.  Eat a healthy diet that includes plenty of nutrients to help your muscles function. A healthy diet includes fruits and vegetables, lean protein, whole grains, and low-fat or nonfat dairy products. General instructions  If you are having frequent cramps, avoid intense exercise for several days.  Take over-the-counter and prescription medicines only as told by your health care provider.  Pay attention to any changes in your symptoms.  Keep all follow-up visits as told by your health care provider. This is important. Contact a health care provider if:  Your cramps or spasms get more severe or happen more often.  Your cramps or spasms do not improve over time. Summary  Muscle cramps and spasms occur when a muscle or muscles tighten and you have no control over this   tightening (involuntary muscle contraction).  The most common place for cramps or spasms to occur is in the calf muscles of the leg.  Massaging, stretching, and relaxing the affected muscle may relieve the cramp or spasm.  Drink enough fluid to keep your urine pale yellow. Staying well hydrated may help prevent cramps or spasms. This information is not intended to replace advice given to you by your health care provider. Make sure you discuss any questions you have with your  health care provider. Document Revised: 03/27/2018 Document Reviewed: 03/27/2018 Elsevier Patient Education  [place]. Cervical Sprain  A cervical sprain is a stretch or tear in one or more of the tough, cord-like tissues that connect bones (ligaments) in the neck. Cervical sprains can range from mild to severe. Severe cervical sprains can cause the spinal bones (vertebrae) in the neck to be unstable. This can lead to spinal cord damage and can result in serious nervous system problems. The amount of time that it takes for a cervical sprain to get better depends on the cause and extent of the injury. Most cervical sprains heal in 4-6 weeks. What are the causes? Cervical sprains may be caused by an injury (trauma), such as from a motor vehicle accident, a fall, or sudden forward and backward whipping movement of the head and neck (whiplash injury). Mild cervical sprains may be caused by wear and tear over time, such as from poor posture, sitting in a chair that does not provide support, or looking up or down for long periods of time. What increases the risk? The following factors may make you more likely to develop this condition:  Participating in activities that have a high risk of trauma to the neck. These include contact sports, auto racing, gymnastics, and diving.  Taking risks when driving or riding in a motor vehicle, such as speeding.  Having osteoarthritis of the spine.  Having poor strength and flexibility of the neck.  A previous neck injury.  Having poor posture.  Spending a lot of time in certain positions that put stress on the neck, such as sitting at a computer for long periods of time. What are the signs or symptoms? Symptoms of this condition include:  Pain, soreness, stiffness, tenderness, swelling, or a burning sensation in the front, back, or sides of the neck.  Sudden tightening of neck muscles that you cannot control (muscle spasms).  Pain in the  shoulders or upper back.  Limited ability to move the neck.  Headache.  Dizziness.  Nausea.  Vomiting.  Weakness, numbness, or tingling in a hand or an arm. Symptoms may develop right away after injury, or they may develop over a few days. In some cases, symptoms may go away with treatment and return (recur) over time. How is this diagnosed? This condition may be diagnosed based on:  Your medical history.  Your symptoms.  Any recent injuries or known neck problems that you have, such as arthritis in the neck.  A physical exam.  Imaging tests, such as: ? X-rays. ? MRI. ? CT scan. How is this treated? This condition is treated by resting and icing the injured area and doing physical therapy exercises. Depending on the severity of your condition, treatment may also include:  Keeping your neck in place (immobilized) for periods of time. This may be done using: ? A cervical collar. This supports your chin and the back of your head. ? A cervical traction device. This is a sling that holds up your  head. This removes weight and pressure from your neck, and it may help to relieve pain.  Medicines that help to relieve pain and inflammation.  Medicines that help to relax your muscles (muscle relaxants).  Surgery. This is rare. Follow these instructions at home: If you have a cervical collar:   Wear it as told by your health care provider. Do not remove the collar unless instructed by your health care provider.  Ask your health care provider before you make any adjustments to your collar.  If you have long hair, keep it outside of the collar.  Ask your health care provider if you can remove the collar for cleaning and bathing. If you are allowed to remove the collar for cleaning or bathing: ? Follow instructions from your health care provider about how to remove the collar safely. ? Clean the collar by wiping it with mild soap and water and drying it completely. ? If your  collar has removable pads, remove them every 1-2 days and wash them by hand with soap and water. Let them air-dry completely before you put them back in the collar. ? Check your skin under the collar for irritation or sores. If you see any, tell your health care provider. Managing pain, stiffness, and swelling   If directed, use a cervical traction device as told by your health care provider.  If directed, apply heat to the affected area before you do your physical therapy or as often as told by your health care provider. Use the heat source that your health care provider recommends, such as a moist heat pack or a heating pad. ? Place a towel between your skin and the heat source. ? Leave the heat on for 20-30 minutes. ? Remove the heat if your skin turns bright red. This is especially important if you are unable to feel pain, heat, or cold. You may have a greater risk of getting burned.  If directed, put ice on the affected area: ? Put ice in a plastic bag. ? Place a towel between your skin and the bag. ? Leave the ice on for 20 minutes, 2-3 times a day. Activity  Do not drive while wearing a cervical collar. If you do not have a cervical collar, ask your health care provider if it is safe to drive while your neck heals.  Do not drive or use heavy machinery while taking prescription pain medicine or muscle relaxants, unless your health care provider approves.  Do not lift anything that is heavier than 10 lb (4.5 kg) until your health care provider tells you that it is safe.  Rest as directed by your health care provider. Avoid positions and activities that make your symptoms worse. Ask your health care provider what activities are safe for you.  If physical therapy was prescribed, do exercises as told by your health care provider or physical therapist. General instructions  Take over-the-counter and prescription medicines only as told by your health care provider.  Do not use any  products that contain nicotine or tobacco, such as cigarettes and e-cigarettes. These can delay healing. If you need help quitting, ask your health care provider.  Keep all follow-up visits as told by your health care provider or physical therapist. This is important. How is this prevented? To prevent a cervical sprain from happening again:  Use and maintain good posture. Make any needed adjustments to your workstation to help you use good posture.  Exercise regularly as directed by your health care  provider or physical therapist.  Avoid risky activities that may cause a cervical sprain. Contact a health care provider if:  You have symptoms that get worse or do not get better after 2 weeks of treatment.  You have pain that gets worse or does not get better with medicine.  You develop new, unexplained symptoms.  You have sores or irritated skin on your neck from wearing your cervical collar. Get help right away if:  You have severe pain.  You develop numbness, tingling, or weakness in any part of your body.  You cannot move a part of your body (you have paralysis).  You have neck pain along with: ? Severe dizziness. ? Headache. Summary  A cervical sprain is a stretch or tear in one or more of the tough, cord-like tissues that connect bones (ligaments) in the neck.  Cervical sprains may be caused by an injury (trauma), such as from a motor vehicle accident, a fall, or sudden forward and backward whipping movement of the head and neck (whiplash injury).  Symptoms may develop right away after injury, or they may develop over a few days.  This condition is treated by resting and icing the injured area and doing physical therapy exercises. This information is not intended to replace advice given to you by your health care provider. Make sure you discuss any questions you have with your health care provider. Document Revised: 02/21/2019 Document Reviewed: 06/30/2016 Elsevier  Patient Education  [place]. Cervical Strain and Sprain Rehab Ask your health care provider which exercises are safe for you. Do exercises exactly as told by your health care provider and adjust them as directed. It is normal to feel mild stretching, pulling, tightness, or discomfort as you do these exercises. Stop right away if you feel sudden pain or your pain gets worse. Do not begin these exercises until told by your health care provider. Stretching and range-of-motion exercises Cervical side bending  1. Using good posture, sit on a stable chair or stand up. 2. Without moving your shoulders, slowly tilt your left / right ear to your shoulder until you feel a stretch in the opposite side neck muscles. You should be looking straight ahead. 3. Hold for _____15_____ seconds. 4. Repeat with the other side of your neck. Repeat ____3______ times. Complete this exercise ___3_______ times a day. Cervical rotation  1. Using good posture, sit on a stable chair or stand up. 2. Slowly turn your head to the side as if you are looking over your left / right shoulder. ? Keep your eyes level with the ground. ? Stop when you feel a stretch along the side and the back of your neck. 3. Hold for __________ seconds. 4. Repeat this by turning to your other side. Repeat __________ times. Complete this exercise __________ times a day. Thoracic extension and pectoral stretch 1. Roll a towel or a small blanket so it is about 4 inches (10 cm) in diameter. 2. Lie down on your back on a firm surface. 3. Put the towel lengthwise, under your spine in the middle of your back. It should not be under your shoulder blades. The towel should line up with your spine from your middle back to your lower back. 4. Put your hands behind your head and let your elbows fall out to your sides. 5. Hold for _____15_____ seconds. Repeat ___3_______ times. Complete this exercise ____3______ times a day. Strengthening  exercises Isometric upper cervical flexion 1. Lie on your back with a  thin pillow behind your head and a small rolled-up towel under your neck. 2. Gently tuck your chin toward your chest and nod your head down to look toward your feet. Do not lift your head off the pillow. 3. Hold for ____15______ seconds. 4. Release the tension slowly. Relax your neck muscles completely before you repeat this exercise. Repeat ____3______ times. Complete this exercise _____3_____ times a day. Isometric cervical extension  1. Stand about 6 inches (15 cm) away from a wall, with your back facing the wall. 2. Place a soft object, about 6-8 inches (15-20 cm) in diameter, between the back of your head and the wall. A soft object could be a small pillow, a ball, or a folded towel. 3. Gently tilt your head back and press into the soft object. Keep your jaw and forehead relaxed. 4. Hold for ___15_______ seconds. 5. Release the tension slowly. Relax your neck muscles completely before you repeat this exercise. Repeat _____3_____ times. Complete this exercise ____3______ times a day. Posture and body mechanics Body mechanics refers to the movements and positions of your body while you do your daily activities. Posture is part of body mechanics. Good posture and healthy body mechanics can help to relieve stress in your body's tissues and joints. Good posture means that your spine is in its natural S-curve position (your spine is neutral), your shoulders are pulled back slightly, and your head is not tipped forward. The following are general guidelines for applying improved posture and body mechanics to your everyday activities. Sitting  1. When sitting, keep your spine neutral and keep your feet flat on the floor. Use a footrest, if necessary, and keep your thighs parallel to the floor. Avoid rounding your shoulders, and avoid tilting your head forward. 2. When working at a desk or a computer, keep your desk at a height  where your hands are slightly lower than your elbows. Slide your chair under your desk so you are close enough to maintain good posture. 3. When working at a computer, place your monitor at a height where you are looking straight ahead and you do not have to tilt your head forward or downward to look at the screen. Standing   When standing, keep your spine neutral and keep your feet about hip-width apart. Keep a slight bend in your knees. Your ears, shoulders, and hips should line up.  When you do a task in which you stand in one place for a long time, place one foot up on a stable object that is 2-4 inches (5-10 cm) high, such as a footstool. This helps keep your spine neutral. Resting When lying down and resting, avoid positions that are most painful for you. Try to support your neck in a neutral position. You can use a contour pillow or a small rolled-up towel. Your pillow should support your neck but not push on it. This information is not intended to replace advice given to you by your health care provider. Make sure you discuss any questions you have with your health care provider. Document Revised: 02/21/2019 Document Reviewed: 08/02/2018 Elsevier Patient Education  [place].

## 2020-02-14 NOTE — Progress Notes (Signed)
Subjective:    Patient ID: Sara Cook, female    DOB: 10-10-1966, 54 y.o.   MRN: [id number]  53y/o Caucasian established female pt c/o L sided neck pain x2-3 weeks. She woke up with the pain one morning. No known injury. Felt like she may have slept on that side wrong, but pain has persisted. Pain is in L side of neck and over L clavicle. Radiates up L neck to L occipital and will cause HAs. She has taken Ibuprofen when she has a HA but no other home treatment. Pain does not radiate down arm, no numbness or tingling. Describes pain as burning sensation. She thinks when she has to wear mask at work all day worsens pain.  Denied changes in duties at work, starting new exercise program or activity at home, weakness arms/hands or rash/bruising.     Review of Systems  Constitutional: Negative for activity change, appetite change, chills, diaphoresis, fatigue and fever.  HENT: Negative for trouble swallowing and voice change.   Eyes: Negative for photophobia and visual disturbance.  Respiratory: Negative for cough, shortness of breath, wheezing and stridor.   Cardiovascular: Negative for chest pain.  Gastrointestinal: Negative for diarrhea, nausea and vomiting.  Endocrine: Negative for cold intolerance and heat intolerance.  Genitourinary: Negative for difficulty urinating.  Musculoskeletal: Positive for myalgias and neck pain. Negative for arthralgias, back pain, gait problem, joint swelling and neck stiffness.  Skin: Negative for color change and rash.  Allergic/Immunologic: Positive for environmental allergies. Negative for food allergies.  Neurological: Positive for headaches. Negative for dizziness, tremors, seizures, syncope, facial asymmetry, speech difficulty, weakness, light-headedness and numbness.  Hematological: Negative for adenopathy. Does not bruise/bleed easily.  Psychiatric/Behavioral: Negative for agitation, confusion and sleep disturbance.       Objective:   Physical  Exam Vitals and nursing note reviewed.  Constitutional:      General: She is awake. She is not in acute distress.    Appearance: Normal appearance. She is well-developed, well-groomed and overweight. She is not ill-appearing, toxic-appearing or diaphoretic.  HENT:     Head: Normocephalic and atraumatic.     Jaw: There is normal jaw occlusion.     Right Ear: Hearing and external ear normal.     Left Ear: Hearing and external ear normal.     Nose: Nose normal.     Mouth/Throat:     Lips: Pink. No lesions.     Mouth: Mucous membranes are moist.     Pharynx: Oropharynx is clear.  Eyes:     General: Lids are normal. Vision grossly intact. Gaze aligned appropriately. No allergic shiner, visual field deficit or scleral icterus.       Right eye: No discharge.        Left eye: No discharge.     Extraocular Movements: Extraocular movements intact.     Conjunctiva/sclera: Conjunctivae normal.     Pupils: Pupils are equal, round, and reactive to light.  Neck:     Thyroid: No thyroid mass, thyromegaly or thyroid tenderness.     Vascular: No carotid bruit.     Trachea: Trachea and phonation normal.      Comments: Decreased AROM due to pain rotation/lateral bending left greater than right Cardiovascular:     Rate and Rhythm: Normal rate and regular rhythm.     Pulses: Normal pulses.          Radial pulses are 2+ on the right side and 2+ on the left side.  Pulmonary:  Effort: Pulmonary effort is normal. No respiratory distress.     Breath sounds: Normal breath sounds and air entry. No stridor, decreased air movement or transmitted upper airway sounds. No decreased breath sounds, wheezing, rhonchi or rales.     Comments: Wearing cloth mask due to covid 19 pandemic; spoke full sentences without difficulty; no cough observed in exam room Abdominal:     Palpations: Abdomen is soft.  Musculoskeletal:        General: Tenderness present. No swelling, deformity or signs of injury.     Right  shoulder: Normal. No swelling, deformity, effusion, laceration, tenderness, bony tenderness or crepitus. Normal range of motion. Normal strength. Normal pulse.     Left shoulder: Tenderness present. No swelling, deformity, effusion, laceration, bony tenderness or crepitus. Decreased range of motion. Normal strength. Normal pulse.     Right elbow: Normal.     Left elbow: Normal.     Right hand: Normal.     Left hand: Normal.     Cervical back: Neck supple. Tenderness present. No edema, erythema, signs of trauma, rigidity or crepitus. Pain with movement and muscular tenderness present. No spinous process tenderness. Decreased range of motion.     Right lower leg: No edema.     Left lower leg: No edema.     Comments: Left shoulder pain with cross body movement of left hand-trapezius/clavicle; negative atchley scratch/neers/empty beer can; full internal/external rotation equal bilaterally; bilateral hand grip and upper extremity strength equal bilaterally  Lymphadenopathy:     Head:     Right side of head: No submental, submandibular, tonsillar, preauricular, posterior auricular or occipital adenopathy.     Left side of head: No submental, submandibular, tonsillar, preauricular, posterior auricular or occipital adenopathy.     Cervical: Cervical adenopathy present.     Right cervical: Deep cervical adenopathy present. No superficial or posterior cervical adenopathy.    Left cervical: No superficial, deep or posterior cervical adenopathy.  Skin:    General: Skin is warm and dry.     Capillary Refill: Capillary refill takes less than 2 seconds.     Coloration: Skin is not ashen, cyanotic, jaundiced, mottled, pale or sallow.     Findings: No abrasion, abscess, acne, bruising, burn, ecchymosis, erythema, signs of injury, laceration, lesion, petechiae, rash or wound.     Nails: There is no clubbing.  Neurological:     General: No focal deficit present.     Mental Status: She is alert and oriented to  person, place, and time. Mental status is at baseline.     GCS: GCS eye subscore is 4. GCS verbal subscore is 5. GCS motor subscore is 6.     Cranial Nerves: Cranial nerves are intact. No cranial nerve deficit, dysarthria or facial asymmetry.     Sensory: Sensation is intact. No sensory deficit.     Motor: Motor function is intact. No weakness, tremor, atrophy, abnormal muscle tone or seizure activity.     Coordination: Coordination is intact. Coordination normal.     Gait: Gait is intact. Gait normal.     Comments: Gait sure and steady in hallway; on/off exam table and in/out of chair without difficulty  Psychiatric:        Attention and Perception: Attention and perception normal.        Mood and Affect: Mood and affect normal.        Speech: Speech normal.        Behavior: Behavior normal. Behavior is cooperative.  Thought Content: Thought content normal.        Cognition and Memory: Cognition and memory normal.        Judgment: Judgment normal.           Assessment & Plan:  A-cervical muscle spasms acute initial visit  P-cyclobenazeprine/flexeril 10mg  sig t1/2-1 po TID prn muscle spasms #30 RF0 dispensed from [facility].  Ibuprofen 800mg  po TID prn pain #30 RF0 dispensed from [facility].  Avoid alcohol intake and driving while taking cyclobenazeprine/flexeril as drowsiness common side effect.  Slow position changes as medication also lower blood pressure.  Home stretches demonstrated to patient-e.g. Arm circles, walking up wall, chest stretches, neck AROM, chin tucks, knee to chest and rock side to side on back, trapezius pinches. Self massage or professional prn, foam roller use or tennis/racquetball.  Heat/cryotherapy 15 minutes QID prn.  Applied 1 UD pkg biofreeze gel in clinic allowed to dry then trial thermacare 1 applied  from clinic stock.  Consider physical therapy referral if no improvement with prescribed therapy from [facility] and/or chiropractic care.  Consider professional massage.   Ensure ergonomics correct desk at work avoid repetitive motions if possible/holding phone/laptop in hand use desk/stand and/or break up lifting items into smaller loads/weights.  Patient was instructed to rest, ice, and ROM exercises.  Activity as tolerated.   Follow up if symptoms persist or worsen especially if loss of bowel/bladder control, arm/leg weakness and/or saddle paresthesias.  Exitcare handout on muscle spasms and cervical sprain rehab exercises will be printed and given to patient once IT fixes printer.  Patient notified will bring to her workcenter.  Patient verbalized agreement and understanding of treatment plan and had no further questions at this time.  P2:  [employer].

## 2020-08-29 ENCOUNTER — Telehealth: Payer: Self-pay | Admitting: *Deleted

## 2020-08-29 DIAGNOSIS — E785 Hyperlipidemia, unspecified: Secondary | ICD-10-CM

## 2020-08-29 NOTE — Telephone Encounter (Signed)
Noted I will follow up with patient later today to discuss symptom management and to verify no tonsillar exudate/enlarged lymph nodes.

## 2020-08-29 NOTE — Telephone Encounter (Signed)
Spoke with pt over phone. Spoke with pt over phone. On Tuesday started having sore throat that she attributed to seasonal allergies. Went to work Wednesday and left early due to chills and fatigue/no energy that started while she was at work. Called out of work on Thursday and today (Friday)  Temp at home on Wed evening and Thurs morning was 37.9C (100.53F). Fever has resolved since yesterday morning. Sore throat remains with PND and frequent throat clearing. Today with productive cough with green mucus. Cough woke her up from sleep this morning. +HA as well with cough.  Denies body aches, chills, sinusitis sx, loss of taste/smell.   Last day on-site: 08/27/20 Day 1 of Sx: 08/25/20 Day 1 of quarantine: 08/26/20 Testing between sx days 3-5 Test scheduled 08/29/20 at 1540 at [place].  Day 7 quarantine: 09/01/20 RTW date: 09/02/20  HR Ivin Booty and Kenney Houseman made aware of same plans.

## 2020-08-29 NOTE — Telephone Encounter (Signed)
RN made aware by HR Ivin Booty that pt called out of work today after leaving early yesterday. Today she reported to supervisor fever and sore throat. Attempted to contact pt by phone. No answer. No voicemail picked up to leave answer. Will attempt again later this morning. Pt fully vaccinated. Anticipate 7 day quarantine and testing.

## 2020-08-30 ENCOUNTER — Encounter: Payer: Self-pay | Admitting: *Deleted

## 2020-08-30 DIAGNOSIS — E785 Hyperlipidemia, unspecified: Secondary | ICD-10-CM | POA: Insufficient documentation

## 2020-08-30 NOTE — Telephone Encounter (Signed)
Patient contacted via telephone and reported cough improved on delsym, honey with lemon and mucinex and drinking a lot of liquids.  Denied N/V/D/fever/chills/seeing white spots in back of throat or enlarged lymph nodes yesterday or today.  Today cough productive clear but yesterday green/dark.  Took tylenol earlier in the week but has stopped use.  Does not like nose sprays "I can't use them".  Hasn't been taking her claritin.  Discussed with patient to restart as she reported increased effort required to breath when going outside.  She typically uses prn seasonal allergies.  Patient spoke full sentences without difficulty, no cough audible during telephone conversation x 8 minutes.  A&Ox3 respirations even and unlabored.  Patient reported that she went to [facility] yesterday for covid test.  Verified day 1 of symptoms 08/25/2020 full covid vaccinated [place].  Patient wanted to verify quarantine dates discussed if negative test through 09/01/2020 and if positive test through 09/04/2020.    Notified patient she can contact me over the weekend if questions via [email or web address].  RN Hildred Alamin back in clinic 09/01/20 x [phone].  Patient verbalized understanding information/instructions, agreed with plan of care and had no further questions at this time.  Severe covid risk slighty elevated due to obesity/age, low vitamin D 19, 54y/o and hyperlipidemia consider monoclonal antibodies if worsening symptoms with positive test results.

## 2020-09-01 NOTE — Telephone Encounter (Signed)
Spoke with pt over phone. She reports Sat and Nancy Fetter she was "really sick" with coughing fits that she couldn't break. Today is improved per pt. Cough noted 2-3 times during 4 minute phone call. Denies fever/chills/n/v/d. Negative covid test results received today. Wants to try to come back to work tomorrow. Today is day 7 of quarantine. Approved pt to RTW tomorrow but if she feels sx worsen tomorrow to contact supervisor to notify and contact HR/clinic. If she reports to work but has to go home early due to fatigue or other sx, she can discuss with her supervisor and does not need clearance to leave or RTW from clinic unless new sx begin.  She verbalizes understanding and agreement with plan of care. No further questions.

## 2020-09-02 ENCOUNTER — Encounter: Payer: Self-pay | Admitting: Registered Nurse

## 2020-09-02 ENCOUNTER — Ambulatory Visit: Payer: Self-pay | Admitting: Registered Nurse

## 2020-09-02 ENCOUNTER — Other Ambulatory Visit: Payer: Self-pay

## 2020-09-02 VITALS — BP 128/92 | HR 111 | Temp 98.0°F

## 2020-09-02 DIAGNOSIS — J209 Acute bronchitis, unspecified: Secondary | ICD-10-CM

## 2020-09-02 MED ORDER — PREDNISONE 10 MG PO TABS: 20.0000 mg | ORAL_TABLET | Freq: Every day | ORAL | 0 refills | Status: AC

## 2020-09-02 MED ORDER — BENZONATATE 200 MG PO CAPS: 200.0000 mg | ORAL_CAPSULE | Freq: Three times a day (TID) | ORAL | 0 refills | Status: AC | PRN

## 2020-09-02 MED ORDER — PHENYLEPHRINE HCL 5 MG PO TABS: 5.0000 mg | ORAL_TABLET | Freq: Four times a day (QID) | ORAL | Status: AC | PRN

## 2020-09-02 NOTE — Telephone Encounter (Signed)
Patient seen in clinic for evaluation/office visit.  See encounter note dated 09/02/2020

## 2020-09-02 NOTE — Progress Notes (Signed)
Subjective:    Patient ID: Sara Cook, female    DOB: 01/25/1966, 54 y.o.   MRN: [id number]  54y/o established caucasian female here for evaluation cough and dyspnea on exertion, reported wheezing at home.  Denied use of inhaler in the past or steroids.  Has seasonal allergies but doesn't use nasal saline/flonase only oral medication prn.  Patient having some post nasal drip was recently on quarantine at home/negative covid test/fully covid vaccinated.  Sx started 08/25/2020 sore throat/allergy symptoms and worsened to chills/fatigue then fever 100.2  And cough final symptom first day back at work yesterday.  Hasn't had flu vaccine yet this year.  OTC cough syrup, mucinex doesn't feel like working enough to help her not cough at work or rest at home.     Review of Systems  Constitutional: Positive for fatigue. Negative for activity change, appetite change, chills, diaphoresis and fever.  HENT: Positive for postnasal drip and rhinorrhea. Negative for congestion, nosebleeds, sinus pressure, sinus pain, sneezing, sore throat, tinnitus, trouble swallowing and voice change.   Eyes: Negative for photophobia and visual disturbance.  Respiratory: Positive for cough, shortness of breath and wheezing. Negative for choking, chest tightness and stridor.   Cardiovascular: Negative for chest pain and palpitations.  Gastrointestinal: Negative for abdominal pain, diarrhea, nausea and vomiting.  Endocrine: Negative for cold intolerance and heat intolerance.  Genitourinary: Negative for difficulty urinating.  Musculoskeletal: Negative for arthralgias, back pain, gait problem, joint swelling, myalgias, neck pain and neck stiffness.  Skin: Negative for color change, pallor, rash and wound.  Allergic/Immunologic: Positive for environmental allergies. Negative for food allergies.  Neurological: Negative for tremors, seizures, syncope, facial asymmetry, speech difficulty, weakness, light-headedness and  numbness.  Psychiatric/Behavioral: Positive for sleep disturbance. Negative for agitation and confusion.       Objective:   Physical Exam Vitals and nursing note reviewed.  Constitutional:      General: She is awake. She is not in acute distress.    Appearance: Normal appearance. She is well-developed and well-groomed. She is obese. She is ill-appearing. She is not toxic-appearing or diaphoretic.  HENT:     Head: Normocephalic and atraumatic.     Jaw: There is normal jaw occlusion. No trismus.     Salivary Glands: Right salivary gland is not diffusely enlarged or tender. Left salivary gland is not diffusely enlarged or tender.     Right Ear: Hearing, ear canal and external ear normal. A middle ear effusion is present.     Left Ear: Hearing, ear canal and external ear normal. A middle ear effusion is present.     Nose: Mucosal edema, congestion and rhinorrhea present. No nasal deformity, septal deviation, signs of injury, laceration or nasal tenderness. Rhinorrhea is clear.     Right Sinus: No maxillary sinus tenderness or frontal sinus tenderness.     Left Sinus: No maxillary sinus tenderness or frontal sinus tenderness.     Mouth/Throat:     Lips: Pink. No lesions.     Mouth: Mucous membranes are moist. Mucous membranes are not pale, not dry and not cyanotic. No lacerations, oral lesions or angioedema.     Dentition: No gingival swelling, dental caries, dental abscesses or gum lesions.     Tongue: No lesions. Tongue does not deviate from midline.     Palate: No mass and lesions.     Pharynx: Uvula midline. Pharyngeal swelling and posterior oropharyngeal erythema present. No oropharyngeal exudate or uvula swelling.     Tonsils: No tonsillar exudate  or tonsillar abscesses. 0 on the right. 0 on the left.     Comments: Cobblestoning posterior pharynx; bilateral allergic shiners; bilateral TMs air fluid level clear; rare nasal sniffing in exam room and nonproductive cough noted scant spoke  full sentences without difficulty; nasal turbinates with edema erythema clear discharge Eyes:     General: Lids are normal. Lids are everted, no foreign bodies appreciated. Gaze aligned appropriately. Allergic shiner present. No visual field deficit or scleral icterus.       Right eye: No foreign body, discharge or hordeolum.        Left eye: No foreign body, discharge or hordeolum.     Extraocular Movements: Extraocular movements intact.     Right eye: Normal extraocular motion and no nystagmus.     Left eye: Normal extraocular motion and no nystagmus.     Conjunctiva/sclera: Conjunctivae normal.     Right eye: Right conjunctiva is not injected. No chemosis, exudate or hemorrhage.    Left eye: Left conjunctiva is not injected. No chemosis, exudate or hemorrhage.    Pupils: Pupils are equal, round, and reactive to light. Pupils are equal.     Right eye: Pupil is round and reactive.     Left eye: Pupil is round and reactive.  Neck:     Thyroid: No thyroid tenderness.     Trachea: Trachea and phonation normal. No tracheal tenderness, tracheostomy, abnormal tracheal secretions or tracheal deviation.  Cardiovascular:     Rate and Rhythm: Regular rhythm. Tachycardia present.     Chest Wall: PMI is not displaced.     Pulses: Normal pulses.          Radial pulses are 2+ on the right side and 2+ on the left side.     Heart sounds: Normal heart sounds, S1 normal and S2 normal. No murmur heard.  No friction rub. No gallop.   Pulmonary:     Effort: Pulmonary effort is normal. No accessory muscle usage or respiratory distress.     Breath sounds: Normal breath sounds and air entry. No stridor or decreased air movement. No decreased breath sounds, wheezing, rhonchi or rales.     Comments: Wearing disposable mask due to covid 19 pandemic Chest:     Chest wall: No tenderness.  Abdominal:     General: Abdomen is flat. There is no distension.     Palpations: Abdomen is soft.  Musculoskeletal:         General: No swelling or tenderness. Normal range of motion.     Right shoulder: Normal. No swelling, deformity, effusion, laceration or crepitus.     Left shoulder: Normal. No swelling, deformity, effusion, laceration or crepitus.     Right elbow: No swelling, deformity or effusion.     Left elbow: No swelling, deformity or effusion.     Right hand: Normal. No swelling, deformity or lacerations. Normal strength.     Left hand: Normal. No swelling, deformity or lacerations. Normal strength.     Cervical back: Normal range of motion and neck supple. No swelling, edema, deformity, erythema, signs of trauma, lacerations, rigidity, spasms, torticollis, tenderness or crepitus. No spinous process tenderness or muscular tenderness. Normal range of motion.     Thoracic back: No swelling, edema, deformity, signs of trauma or lacerations.     Lumbar back: No swelling, edema, deformity, signs of trauma, lacerations or spasms.     Right hip: Normal. No deformity, lacerations or crepitus.     Left hip: Normal. No deformity,  lacerations or crepitus.     Right knee: Normal. No swelling, deformity, lacerations or crepitus.     Left knee: Normal. No swelling, deformity, lacerations or crepitus.     Right lower leg: No edema.     Left lower leg: No edema.  Lymphadenopathy:     Head:     Right side of head: No submental, submandibular, tonsillar, preauricular, posterior auricular or occipital adenopathy.     Left side of head: No submental, submandibular, tonsillar, preauricular, posterior auricular or occipital adenopathy.     Cervical: No cervical adenopathy.     Right cervical: No superficial, deep or posterior cervical adenopathy.    Left cervical: No superficial, deep or posterior cervical adenopathy.  Skin:    General: Skin is warm and dry.     Capillary Refill: Capillary refill takes less than 2 seconds.     Coloration: Skin is not ashen, cyanotic, jaundiced, mottled, pale or sallow.     Findings: No  abrasion, abscess, acne, bruising, burn, ecchymosis, erythema, signs of injury, laceration, lesion, petechiae, rash or wound.     Nails: There is no clubbing.  Neurological:     General: No focal deficit present.     Mental Status: She is alert and oriented to person, place, and time. Mental status is at baseline. She is not disoriented.     GCS: GCS eye subscore is 4. GCS verbal subscore is 5. GCS motor subscore is 6.     Cranial Nerves: Cranial nerves are intact. No cranial nerve deficit, dysarthria or facial asymmetry.     Sensory: Sensation is intact. No sensory deficit.     Motor: Motor function is intact. No weakness, tremor, atrophy, abnormal muscle tone or seizure activity.     Coordination: Coordination is intact. Coordination normal.     Gait: Gait is intact. Gait normal.     Comments: Gait sure and steady in clinic; in/out of chair and on/off exam table without difficulty; bilateral hand grasp equal; patient appears tired today compared to her baseline  Psychiatric:        Attention and Perception: Attention and perception normal.        Mood and Affect: Mood and affect normal.        Speech: Speech normal.        Behavior: Behavior normal. Behavior is cooperative.        Thought Content: Thought content normal.        Cognition and Memory: Cognition and memory normal.        Judgment: Judgment normal.           Assessment & Plan:  A-acute bronchitis, acute rhinitis, tachycardia  P-Prednisone 20mg  po daily with breakfast (today lunch) x 5 days #21 RF0 dispensed from [facility] to patient.  Electronic Rx tessalon pearles 200mg  po TID prn cough #30 RF0. Phenylephrine 5-10mg  po q6h prn rhinitis. Discussed continue mucinex if she feels phlegm thick and hard to expectorate but if rhinitis clear drip only and no productive cough may stop mucinex.  Will hold on inhaler and antibiotic at this time.  Patient will not do nose sprays discussed flonase/saline helpful for many patients.  Follow  up Thursday via telephone or in clinic patient preference. May use OTC Cough lozenges po prn cough per manufacturer instructions  Possible side effects prednisone increased/decreased appetite, difficulty sleeping, increased blood sugar, increased blood pressure and heart rate. Bronchitis simple, community acquired, may have started as viral (probably respiratory syncytial, parainfluenza, influenza, or adenovirus),  covid test was negative.  Post nasal drip irritation/ bronchial edema and mucus formation.  Viruses are the most common cause of bronchial inflammation in otherwise healthy adults with acute bronchitis.  The appearance of sputum is not predictive of whether a bacterial infection is present.  Purulent sputum is most often caused by viral infections.  There are a small portion of those caused by non-viral agents being Mycoplama pneumonia.  Microscopic examination or C&S of sputum in the healthy adult with acute bronchitis is generally not helpful (usually negative or normal respiratory flora) other considerations being cough from upper respiratory tract infections, sinusitis or allergic syndromes (mild asthma or viral pneumonia).  Differential Diagnoses:  reactive airway disease (asthma, allergic aspergillosis (eosinophilia), chronic bronchitis, respiratory infection (sinusitis, common cold, pneumonia), congestive heart failure, reflux esophagitis, bronchogenic tumor, aspiration syndromes and/or exposure to pulmonary irritants/smoke.  Without high fever, severe dyspnea, lack of physical findings or other risk factors, I will hold on a chest radiograph and CBC at this time. Approximately 50% of patients with acute bronchitis have a cough that lasts up to three weeks, and 25% for over a month.  Tylenol 500mg  one to two tablets every four to six hours as needed for fever or myalgias.  No aspirin. [employer]care handout on bronchitis and nonallergic rhinitis printed and given to patient.  ER if hemopthysis, SOB,  worst chest pain of life.  Follow up with clinic staff if fever/opaque sputum/dyspnea.  Patient verbalized agreement and understanding of treatment plan.  P2:  hand washing and cover cough  Patient on OTC URI medications side effect tachycardia.  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  Encouraged patient to hydrate/rest when she got home from work today.  Elevated heart rate not uncommon side effect OTC cold medications but if having chest pain, dizzyness, trouble breathing to follow up with a provider for re-evaluation.

## 2020-09-02 NOTE — Patient Instructions (Signed)
Nonallergic Rhinitis  1. Nonallergic rhinitis is a term used by allergist to describe inflammation in the nose that is not due to an allergic source (i.e., pollens, mold, animal dander or dust mites). 2. Nonallergic rhinitis can mimic many of the symptoms caused by allergies.  This includes a runny nose ("rhinorrhea"), sneezing, congestion, ear fullness and post nasal drip. 3. These symptoms usually occur year round but can be made worse by many environmental factors including weather change, irritants such as cigarette smoke, fumes, perfumes, detergents and many others. These are not allergens, but are irritants, and do not cause the formation of antibodies like true allergens.  For this reason most people with nonallergic rhinitis have negative skin tests. 4. Unfortunately, we have a poor understanding of what causes nonallergic rhinitis but certain factors such as deviated septum, sinusitis and nasal polyps may contribute to the symptoms.  Due to the poor understanding of what causes nonallergic rhinitis it cannot be cured and our treatment is only symptomatic to control symptoms. 5. Important factors in the successful treatment of nonallergic rhinitis include avoidance of the irritants that cause symptoms; for example, people who continue to smoke may never improve.  Continuous therapy works better than intermittent.  This may mean taking medications once daily or 3-4 times a day. Helpful treatments include nasal saline lavage, nasal ipratropium (Atrovent), nasal steroids, and decongestants.  Pure antihistamines don't seem to work very well.  Immunotherapy (allergy shots) has no role in the treatment of nonallergic rhinitis.  Several medications may have to be tried before a good combination is found for you.  These medications, in general, are safe for long term use.  Tolerance may develop to the therapeutic effects of these medications: Frequently changing between several helpful medications can prevent  this should it occur.Acute Bronchitis, Adult  Acute bronchitis is sudden or acute swelling of the air tubes (bronchi) in the lungs. Acute bronchitis causes these tubes to fill with mucus, which can make it hard to breathe. It can also cause coughing or wheezing. In adults, acute bronchitis usually goes away within 2 weeks. A cough caused by bronchitis may last up to 3 weeks. Smoking, allergies, and asthma can make the condition worse. What are the causes? This condition can be caused by germs and by substances that irritate the lungs, including:  Cold and flu viruses. The most common cause of this condition is the virus that causes the common cold.  Bacteria.  Substances that irritate the lungs, including: ? Smoke from cigarettes and other forms of tobacco. ? Dust and pollen. ? Fumes from chemical products, gases, or burned fuel. ? Other materials that pollute indoor or outdoor air.  Close contact with someone who has acute bronchitis. What increases the risk? The following factors may make you more likely to develop this condition:  A weak body's defense system, also called the immune system.  A condition that affects your lungs and breathing, such as asthma. What are the signs or symptoms? Common symptoms of this condition include:  Lung and breathing problems, such as: ? Coughing. This may bring up clear, yellow, or green mucus from your lungs (sputum). ? Wheezing. ? Having too much mucus in your lungs (chest congestion). ? Having shortness of breath.  A fever.  Chills.  Aches and pains, including: ? Tightness in your chest and other body aches. ? A sore throat. How is this diagnosed? This condition is usually diagnosed based on:  Your symptoms and medical history.  A physical  exam. You may also have other tests, including tests to rule out other conditions, such pneumonia. These tests include:  A test of lung function.  Test of a mucus sample to look for the  presence of bacteria.  Tests to check the oxygen level in your blood.  Blood tests.  Chest X-ray. How is this treated? Most cases of acute bronchitis clear up over time without treatment. Your health care provider may recommend:  Drinking more fluids. This can thin your mucus, which may improve your breathing.  Taking a medicine for a fever or cough.  Using a device that gets medicine into your lungs (inhaler) to help improve breathing and control coughing.  Using a vaporizer or a humidifier. These are machines that add water to the air to help you breathe better. Follow these instructions at home: Activity  Get plenty of rest.  Return to your normal activities as told by your health care provider. Ask your health care provider what activities are safe for you. Lifestyle  Drink enough fluid to keep your urine pale yellow.  Do not drink alcohol.  Do not use any products that contain nicotine or tobacco, such as cigarettes, e-cigarettes, and chewing tobacco. If you need help quitting, ask your health care provider. Be aware that: ? Your bronchitis will get worse if you smoke or breathe in other people's smoke (secondhand smoke). ? Your lungs will heal faster if you quit smoking. General instructions   Take over-the-counter and prescription medicines only as told by your health care provider.  Use an inhaler, vaporizer, or humidifier as told by your health care provider.  If you have a sore throat, gargle with a salt-water mixture 3-4 times a day or as needed. To make a salt-water mixture, completely dissolve -1 tsp (3-6 g) of salt in 1 cup (237 mL) of warm water.  Keep all follow-up visits as told by your health care provider. This is important. How is this prevented? To lower your risk of getting this condition again:  Wash your hands often with soap and water. If soap and water are not available, use hand sanitizer.  Avoid contact with people who have cold  symptoms.  Try not to touch your mouth, nose, or eyes with your hands.  Avoid places where there are fumes from chemicals. Breathing these fumes will make your condition worse.  Get the flu shot every year. Contact a health care provider if:  Your symptoms do not improve after 2 weeks of treatment.  You vomit more than once or twice.  You have symptoms of dehydration such as: ? Dark urine. ? Dry skin or eyes. ? Increased thirst. ? Headaches. ? Confusion. ? Muscle cramps. Get help right away if you:  Cough up blood.  Feel pain in your chest.  Have severe shortness of breath.  Faint or keep feeling like you are going to faint.  Have a severe headache.  Have fever or chills that get worse. These symptoms may represent a serious problem that is an emergency. Do not wait to see if the symptoms will go away. Get medical help right away. Call your local emergency services (911 in the [place] Do not drive yourself to the hospital. Summary  Acute bronchitis is sudden (acute) inflammation of the air tubes (bronchi) between the windpipe and the lungs. In adults, acute bronchitis usually goes away within 2 weeks, although coughing may last 3 weeks or longer  Take over-the-counter and prescription medicines only as told by your  health care provider.  Drink enough fluid to keep your urine pale yellow.  Contact a health care provider if your symptoms do not improve after 2 weeks of treatment.  Get help right away if you cough up blood, faint, or have chest pain or shortness of breath. This information is not intended to replace advice given to you by your health care provider. Make sure you discuss any questions you have with your health care provider. Document Revised: 07/16/2019 Document Reviewed: 05/25/2019 Elsevier Patient Education  [place].

## 2020-09-02 NOTE — Telephone Encounter (Signed)
Noted will follow up with patient today at work.  Still having cough.

## 2020-09-04 NOTE — Telephone Encounter (Signed)
Patient supervisor Georgina Snell reported she has been leaving 2 hours early (noon versus 1400) each shift this week due to fatigue.  Still having a little bit of cough.  Please call patient to follow up if symptoms improving or new/worsening.

## 2020-09-09 ENCOUNTER — Other Ambulatory Visit: Payer: Self-pay

## 2020-09-09 ENCOUNTER — Ambulatory Visit: Payer: Self-pay | Admitting: Registered Nurse

## 2020-09-09 VITALS — BP 122/98 | HR 98 | Temp 98.4°F

## 2020-09-09 DIAGNOSIS — H6982 Other specified disorders of Eustachian tube, left ear: Secondary | ICD-10-CM

## 2020-09-09 DIAGNOSIS — J00 Acute nasopharyngitis [common cold]: Secondary | ICD-10-CM

## 2020-09-09 DIAGNOSIS — J209 Acute bronchitis, unspecified: Secondary | ICD-10-CM

## 2020-09-09 MED ORDER — PREDNISONE 10 MG PO TABS: 40.0000 mg | ORAL_TABLET | Freq: Every day | ORAL | 0 refills | Status: DC

## 2020-09-09 NOTE — Progress Notes (Signed)
Subjective:    Patient ID: Sara Cook, female    DOB: 11-Aug-1966, 54 y.o.   MRN: [id number]  54y/o Caucasian established female pt presenting for f/u of cough that has continued since last being seen 09/02/20 and completing low dose prednisone 20mg  x5 days. Cough will wake her up at night and keep her up.  Noticed some shortness of breath/wheezing at work with climbing ladders.  Still taking tessalon pearles but stopped dayquil/delsym/allergy medication.  Cough worst when lying down to bed at night.  Post nasal drip noted.  Left side throat sore along with left ear also.  Denied sinus pain/pressure, ear discharge, fever/chills.     Review of Systems  Constitutional: Positive for fatigue. Negative for activity change, appetite change, chills, diaphoresis and fever.  HENT: Positive for ear pain, postnasal drip and sore throat. Negative for congestion, dental problem, drooling, ear discharge, facial swelling, hearing loss, mouth sores, nosebleeds, rhinorrhea, sinus pressure, sinus pain, sneezing, tinnitus, trouble swallowing and voice change.   Eyes: Negative for photophobia and visual disturbance.  Respiratory: Positive for cough, shortness of breath and wheezing. Negative for choking and stridor.   Cardiovascular: Negative for chest pain.  Gastrointestinal: Negative for diarrhea, nausea and vomiting.  Endocrine: Negative for cold intolerance and heat intolerance.  Genitourinary: Negative for difficulty urinating.  Musculoskeletal: Negative for back pain, gait problem, joint swelling, myalgias, neck pain and neck stiffness.  Skin: Negative for color change and rash.  Allergic/Immunologic: Positive for environmental allergies. Negative for food allergies.  Neurological: Negative for tremors, seizures, syncope, speech difficulty, weakness, light-headedness, numbness and headaches.  Hematological: Negative for adenopathy. Does not bruise/bleed easily.  Psychiatric/Behavioral: Negative for  agitation, confusion and sleep disturbance.       Objective:   Physical Exam Vitals and nursing note reviewed.  Constitutional:      General: She is awake. She is not in acute distress.    Appearance: Normal appearance. She is well-developed and well-groomed. She is obese. She is not ill-appearing, toxic-appearing or diaphoretic.  HENT:     Head: Normocephalic and atraumatic.     Jaw: There is normal jaw occlusion. No trismus, tenderness, swelling, pain on movement or malocclusion.     Salivary Glands: Right salivary gland is not diffusely enlarged or tender. Left salivary gland is not diffusely enlarged or tender.     Right Ear: Hearing, ear canal and external ear normal. A middle ear effusion is present. There is no impacted cerumen.     Left Ear: Hearing, ear canal and external ear normal. A middle ear effusion is present. There is no impacted cerumen.     Nose: Mucosal edema and rhinorrhea present. No nasal deformity, septal deviation, signs of injury, laceration, nasal tenderness or congestion. Rhinorrhea is clear.     Right Turbinates: Enlarged and swollen. Not pale.     Left Turbinates: Enlarged and swollen. Not pale.     Right Sinus: No maxillary sinus tenderness or frontal sinus tenderness.     Left Sinus: No maxillary sinus tenderness or frontal sinus tenderness.     Mouth/Throat:     Lips: Pink. No lesions.     Mouth: Mucous membranes are moist. Mucous membranes are not pale, not dry and not cyanotic. No injury, lacerations, oral lesions or angioedema.     Dentition: Normal dentition. Does not have dentures. No dental tenderness, gingival swelling, dental caries, dental abscesses or gum lesions.     Tongue: No lesions. Tongue does not deviate from midline.  Palate: No mass and lesions.     Pharynx: Uvula midline. Pharyngeal swelling and posterior oropharyngeal erythema present. No oropharyngeal exudate or uvula swelling.     Tonsils: No tonsillar exudate or tonsillar  abscesses. 1+ on the right. 1+ on the left.     Comments: Tonsils 1+/4 bilaterally; cobblestoning posterior pharynx; bilateral allergic shiners; clear discharge bilateral nasal turbinates; air fluid level noted bilateral TMs Eyes:     General: Lids are normal. Vision grossly intact. Gaze aligned appropriately. Allergic shiner present. No visual field deficit or scleral icterus.       Right eye: No foreign body, discharge or hordeolum.        Left eye: No foreign body, discharge or hordeolum.     Extraocular Movements: Extraocular movements intact.     Right eye: Normal extraocular motion and no nystagmus.     Left eye: Normal extraocular motion and no nystagmus.     Conjunctiva/sclera: Conjunctivae normal.     Right eye: Right conjunctiva is not injected. No chemosis, exudate or hemorrhage.    Left eye: Left conjunctiva is not injected. No chemosis, exudate or hemorrhage.    Pupils: Pupils are equal, round, and reactive to light. Pupils are equal.     Right eye: Pupil is round and reactive.     Left eye: Pupil is round and reactive.  Neck:     Thyroid: No thyroid mass or thyromegaly.     Trachea: Trachea and phonation normal. No tracheal tenderness or tracheal deviation.  Cardiovascular:     Rate and Rhythm: Normal rate and regular rhythm.     Chest Wall: PMI is not displaced.     Pulses: Normal pulses.          Radial pulses are 2+ on the right side and 2+ on the left side.     Heart sounds: Normal heart sounds, S1 normal and S2 normal. No murmur heard.  No friction rub. No gallop.   Pulmonary:     Effort: Pulmonary effort is normal. No accessory muscle usage or respiratory distress.     Breath sounds: Normal air entry. Transmitted upper airway sounds present. No stridor or decreased air movement. Examination of the right-upper field reveals wheezing. Examination of the left-upper field reveals wheezing. Wheezing present. No decreased breath sounds, rhonchi or rales.     Comments: Barky  intermittant cough in exam room; spoke full sentences without difficulty; wearing disposable surgical mask due to covid 19 pandemic; spo2 stable 97-98% RA while speaking Chest:     Chest wall: No tenderness.  Abdominal:     General: Abdomen is flat. There is no distension.     Palpations: Abdomen is soft.  Musculoskeletal:        General: No swelling, tenderness, deformity or signs of injury. Normal range of motion.     Right shoulder: Normal. No swelling, deformity, effusion or laceration. Normal range of motion.     Left shoulder: Normal. No swelling, deformity, effusion or laceration. Normal range of motion.     Right elbow: Normal. No swelling, deformity, effusion or lacerations. Normal range of motion.     Left elbow: Normal. No swelling, deformity, effusion or lacerations. Normal range of motion.     Right wrist: No swelling, deformity, effusion or lacerations.     Left wrist: No swelling, deformity, effusion or lacerations.     Right hand: Normal. No swelling, deformity or lacerations. Normal range of motion.     Left hand: Normal. No swelling,  deformity or lacerations. Normal range of motion.     Cervical back: Normal, normal range of motion and neck supple. No swelling, edema, deformity, erythema, signs of trauma, lacerations, rigidity, torticollis, tenderness or crepitus. No pain with movement, spinous process tenderness or muscular tenderness. Normal range of motion.     Thoracic back: Normal. No swelling, edema, deformity, signs of trauma or lacerations.     Lumbar back: Normal. No swelling, edema, deformity, signs of trauma or lacerations. Normal range of motion.     Right hip: Normal. No deformity, lacerations or crepitus. Normal range of motion.     Left hip: Normal. No deformity, lacerations or crepitus. Normal range of motion.     Right knee: Normal.     Left knee: Normal.  Lymphadenopathy:     Head:     Right side of head: No submental, submandibular, tonsillar,  preauricular, posterior auricular or occipital adenopathy.     Left side of head: No submental, submandibular, tonsillar, preauricular, posterior auricular or occipital adenopathy.     Cervical: No cervical adenopathy.     Right cervical: No superficial, deep or posterior cervical adenopathy.    Left cervical: No superficial, deep or posterior cervical adenopathy.  Skin:    General: Skin is warm and dry.     Capillary Refill: Capillary refill takes less than 2 seconds.     Coloration: Skin is not ashen, cyanotic, jaundiced, mottled, pale or sallow.     Findings: No abrasion, abscess, acne, bruising, burn, ecchymosis, erythema, signs of injury, laceration, lesion, petechiae, rash or wound.     Nails: There is no clubbing.  Neurological:     General: No focal deficit present.     Mental Status: She is alert and oriented to person, place, and time. Mental status is at baseline. She is not disoriented.     GCS: GCS eye subscore is 4. GCS verbal subscore is 5. GCS motor subscore is 6.     Cranial Nerves: Cranial nerves are intact. No cranial nerve deficit, dysarthria or facial asymmetry.     Sensory: Sensation is intact. No sensory deficit.     Motor: Motor function is intact. No weakness, tremor, atrophy, abnormal muscle tone or seizure activity.     Coordination: Coordination is intact. Coordination normal.     Gait: Gait is intact. Gait normal.     Comments: Bilateral hand grasp equal 5/5; in/out of chair and on/off exam table without difficulty; gait sure and steady in clinic/warehouse  Psychiatric:        Attention and Perception: Attention and perception normal.        Mood and Affect: Mood and affect normal.        Speech: Speech normal.        Behavior: Behavior normal. Behavior is cooperative.        Thought Content: Thought content normal.        Cognition and Memory: Cognition and memory normal.        Judgment: Judgment normal.           Assessment & Plan:  A-acute  bronchitis, acute rhinitis, tachycardia  P-Prednisone 40mg  po daily with breakfast (today lunch) x 3 days #21 previously dispensed from [facility] to patient and to follow up in 48 hours with me in clinic Thursday 11-2.  Continue tessalon pearles 200mg  po TID prn cough previous Rx.  May restart delsym cough medicine otc per manufacturer instructions. Restart dayquil per manufacturer instructions or Phenylephrine 5-10mg  po q6h prn  rhinitis.  Will hold on inhaler and antibiotic at this time.  Patient will not do nose sprays discussed flonase/saline helpful for many patients.  May use OTC Cough lozenges po prn cough per manufacturer instructions  Possible side effects prednisone increased/decreased appetite, difficulty sleeping, increased blood sugar, increased blood pressure and heart rate. Bronchitis simple, community acquired, may have started as viral (probably respiratory syncytial, parainfluenza, influenza, or adenovirus),  covid test was negative.  Post nasal drip irritation/ bronchial edema and mucus formation.  Viruses are the most common cause of bronchial inflammation in otherwise healthy adults with acute bronchitis.  The appearance of sputum is not predictive of whether a bacterial infection is present.  Purulent sputum is most often caused by viral infections.  There are a small portion of those caused by non-viral agents being Mycoplama pneumonia.  Microscopic examination or C&S of sputum in the healthy adult with acute bronchitis is generally not helpful (usually negative or normal respiratory flora) other considerations being cough from upper respiratory tract infections, sinusitis or allergic syndromes (mild asthma or viral pneumonia).  Differential Diagnoses:  reactive airway disease (asthma, allergic aspergillosis (eosinophilia), chronic bronchitis, respiratory infection (sinusitis, common cold, pneumonia), congestive heart failure, reflux esophagitis, bronchogenic tumor, aspiration syndromes and/or  exposure to pulmonary irritants/smoke.  Without high fever, severe dyspnea, lack of physical findings or other risk factors, I will hold on a chest radiograph and CBC at this time. Approximately 50% of patients with acute bronchitis have a cough that lasts up to three weeks, and 25% for over a month.  Tylenol 500mg  one to two tablets every four to six hours as needed for fever or myalgias.  No aspirin. Exitcare handout on bronchitis.  ER if hemopthysis, SOB, worst chest pain of life.  Follow up with clinic staff if fever/opaque sputum/dyspnea.  Patient verbalized agreement and understanding of treatment plan.  P2:  hand washing and cover cough  Discussed post nasal drip most likely allergy related or cold temperature rhinitis restart otc oral allergy medication once a day. OTC antihistamine of choice claritin/zyrtec 10mg  po daily.  Avoid triggers if possible.  Shower prior to bedtime if exposed to triggers.  If allergic dust/dust mites recommend mattress/pillow covers/encasements; washing linens, vacuuming, sweeping, dusting weekly.  Call or return to clinic as needed if these symptoms worsen or fail to improve as anticipated.   Exitcare handout on allergic rhinitis  Patient verbalized understanding of instructions, agreed with plan of care and had no further questions at this time.  P2:  Avoidance and hand washing.   No evidence of invasive bacterial infection, non toxic and well hydrated.  I do not see where any further testing or imaging is necessary at this time.   I will suggest supportive care, rest, good hygiene and encourage the patient to take adequate fluids.  The patient is to return to clinic or EMERGENCY ROOM if symptoms worsen or change significantly e.g. ear pain, fever, purulent discharge from ears or bleeding.  Exitcare handout on eustachian tube dysfunction.  Discussed with patient post nasal drip irritates throat/causes swelling blocks eustachian tubes from draining and fluid fills up middle  ear.  Bacteria/viruses can grow in fluid and with moving head tube compressed and increases pressure in tube/ear worsening pain.  Studies show will take 30 days for fluid to resolve after post nasal drip controlled with nasal steroid/antihistamine. Antibiotics and steroids do not speed up fluid removal.  Patient verbalized agreement and understanding of treatment plan and had no further questions at  this time.

## 2020-09-09 NOTE — Telephone Encounter (Signed)
Pt seen in clinic today. See separate OV notes for 10/26. Closing encounter.

## 2020-09-09 NOTE — Patient Instructions (Signed)
Nonallergic Rhinitis Nonallergic rhinitis is a condition that causes symptoms that affect the nose, such as a runny nose and a stuffed-up nose (nasal congestion) that can make it hard to breathe through the nose. This condition is different from having an allergy (allergic rhinitis). Allergic rhinitis occurs when the body's defense system (immune system) reacts to a substance that you are allergic to (allergen), such as pollen, pet dander, mold, or dust. Nonallergic rhinitis has many similar symptoms, but it is not caused by allergens. Nonallergic rhinitis can be a short-term or long-term problem. What are the causes? This condition can be caused by many different things. Some common types of nonallergic rhinitis include: Infectious rhinitis  This is usually due to an infection in the upper respiratory tract. Vasomotor rhinitis  This is the most common type of long-term nonallergic rhinitis.  It is caused by too much blood flow through the nose, which makes the tissue inside of the nose swell.  Symptoms are often triggered by strong odors, cold air, stress, drinking alcohol, cigarette smoke, or changes in the weather. Occupational rhinitis  This type is caused by triggers in the workplace, such as chemicals, dusts, animal dander, or air pollution. Hormonal rhinitis  This type occurs in women as a result of an increase in the female hormone estrogen.  It may occur during pregnancy, puberty, and menstrual cycles.  Symptoms improve when estrogen levels drop. Drug-induced rhinitis Several drugs can cause nonallergic rhinitis, including:  Medicines that are used to treat high blood pressure, heart disease, and Parkinson disease.  Aspirin and NSAIDs.  Over-the-counter nasal decongestant sprays. These can cause a type of nonallergic rhinitis (rhinitis medicamentosa) when they are used for more than a few days. Nonallergic rhinitis with eosinophilia syndrome (NARES)  This type is caused by  having too much of a certain type of white blood cell (eosinophil). Nonallergic rhinitis can also be caused by a reaction to eating hot or spicy foods. This does not usually cause long-term symptoms. In some cases, the cause of nonallergic rhinitis is not known. What increases the risk? You are more likely to develop this condition if:  You are 45-38 years of age.  You are a woman. Women are twice as likely to have this condition. What are the signs or symptoms? Common symptoms of this condition include:  Nasal congestion.  Runny nose.  The feeling of mucus going down the back of the throat (postnasal drip).  Trouble sleeping at night and daytime sleepiness. Less common symptoms include:  Sneezing.  Coughing.  Itchy nose.  Bloodshot eyes. How is this diagnosed? This condition may be diagnosed based on:  Your symptoms and medical history.  A physical exam.  Allergy testing to rule out allergic rhinitis. You may have skin tests or blood tests. In some cases, the health care provider may take a swab of nasal secretions to look for an increased number of eosinophils. This would be done to confirm a diagnosis of NARES. How is this treated? Treatment for this condition depends on the cause. No single treatment works for everyone. Work with your health care provider to find the best treatment for you. Treatment may include:  Avoiding the things that trigger your symptoms.  Using medicines to relieve congestion, such as: ? Steroid nasal spray. There are many types. You may need to try a few to find out which one works best. ? Decongestant medicine. This may be an oral medicine or a nasal spray. These medicines are only used for  a short time.  Using medicines to relieve a runny nose. These may include antihistamine medicines or anticholinergic nasal sprays.  Surgery to remove tissue from inside the nose may be needed in severe cases if the condition has not improved after 6-12  months of medical treatment. Follow these instructions at home:  Take or use over-the-counter and prescription medicines only as told by your health care provider. Do not stop using your medicine even if you start to feel better.  Use salt-water (saline) rinses or other solutions (nasal washes or irrigations) to wash or rinse out the inside of your nose as told by your health care provider.  Do not take NSAIDs or medicines that contain aspirin if they make your symptoms worse.  Do not drink alcohol if it makes your symptoms worse.  Do not use any tobacco products, such as cigarettes, chewing tobacco, and e-cigarettes. If you need help quitting, ask your health care provider.  Avoid secondhand smoke.  Get some exercise every day. Exercise may help reduce symptoms of nonallergic rhinitis for some people. Ask your health care provider how much exercise and what types of exercise are safe for you.  Sleep with the head of your bed raised (elevated). This may reduce nighttime nasal congestion.  Keep all follow-up visits as told by your health care provider. This is important. Contact a health care provider if:  You have a fever.  Your symptoms are getting worse at home.  Your symptoms are not responding to medicine.  You develop new symptoms, especially a headache or nosebleed. This information is not intended to replace advice given to you by your health care provider. Make sure you discuss any questions you have with your health care provider. Document Revised: 10/14/2017 Document Reviewed: 01/22/2016 Elsevier Patient Education  [place]. Acute Bronchitis, Adult  Acute bronchitis is sudden or acute swelling of the air tubes (bronchi) in the lungs. Acute bronchitis causes these tubes to fill with mucus, which can make it hard to breathe. It can also cause coughing or wheezing. In adults, acute bronchitis usually goes away within 2 weeks. A cough caused by bronchitis may last  up to 3 weeks. Smoking, allergies, and asthma can make the condition worse. What are the causes? This condition can be caused by germs and by substances that irritate the lungs, including:  Cold and flu viruses. The most common cause of this condition is the virus that causes the common cold.  Bacteria.  Substances that irritate the lungs, including: ? Smoke from cigarettes and other forms of tobacco. ? Dust and pollen. ? Fumes from chemical products, gases, or burned fuel. ? Other materials that pollute indoor or outdoor air.  Close contact with someone who has acute bronchitis. What increases the risk? The following factors may make you more likely to develop this condition:  A weak body's defense system, also called the immune system.  A condition that affects your lungs and breathing, such as asthma. What are the signs or symptoms? Common symptoms of this condition include:  Lung and breathing problems, such as: ? Coughing. This may bring up clear, yellow, or green mucus from your lungs (sputum). ? Wheezing. ? Having too much mucus in your lungs (chest congestion). ? Having shortness of breath.  A fever.  Chills.  Aches and pains, including: ? Tightness in your chest and other body aches. ? A sore throat. How is this diagnosed? This condition is usually diagnosed based on:  Your symptoms and medical  history.  A physical exam. You may also have other tests, including tests to rule out other conditions, such pneumonia. These tests include:  A test of lung function.  Test of a mucus sample to look for the presence of bacteria.  Tests to check the oxygen level in your blood.  Blood tests.  Chest X-ray. How is this treated? Most cases of acute bronchitis clear up over time without treatment. Your health care provider may recommend:  Drinking more fluids. This can thin your mucus, which may improve your breathing.  Taking a medicine for a fever or  cough.  Using a device that gets medicine into your lungs (inhaler) to help improve breathing and control coughing.  Using a vaporizer or a humidifier. These are machines that add water to the air to help you breathe better. Follow these instructions at home: Activity  Get plenty of rest.  Return to your normal activities as told by your health care provider. Ask your health care provider what activities are safe for you. Lifestyle  Drink enough fluid to keep your urine pale yellow.  Do not drink alcohol.  Do not use any products that contain nicotine or tobacco, such as cigarettes, e-cigarettes, and chewing tobacco. If you need help quitting, ask your health care provider. Be aware that: ? Your bronchitis will get worse if you smoke or breathe in other people's smoke (secondhand smoke). ? Your lungs will heal faster if you quit smoking. General instructions   Take over-the-counter and prescription medicines only as told by your health care provider.  Use an inhaler, vaporizer, or humidifier as told by your health care provider.  If you have a sore throat, gargle with a salt-water mixture 3-4 times a day or as needed. To make a salt-water mixture, completely dissolve -1 tsp (3-6 g) of salt in 1 cup (237 mL) of warm water.  Keep all follow-up visits as told by your health care provider. This is important. How is this prevented? To lower your risk of getting this condition again:  Wash your hands often with soap and water. If soap and water are not available, use hand sanitizer.  Avoid contact with people who have cold symptoms.  Try not to touch your mouth, nose, or eyes with your hands.  Avoid places where there are fumes from chemicals. Breathing these fumes will make your condition worse.  Get the flu shot every year. Contact a health care provider if:  Your symptoms do not improve after 2 weeks of treatment.  You vomit more than once or twice.  You have symptoms of  dehydration such as: ? Dark urine. ? Dry skin or eyes. ? Increased thirst. ? Headaches. ? Confusion. ? Muscle cramps. Get help right away if you:  Cough up blood.  Feel pain in your chest.  Have severe shortness of breath.  Faint or keep feeling like you are going to faint.  Have a severe headache.  Have fever or chills that get worse. These symptoms may represent a serious problem that is an emergency. Do not wait to see if the symptoms will go away. Get medical help right away. Call your local emergency services (911 in the [place] Do not drive yourself to the hospital. Summary  Acute bronchitis is sudden (acute) inflammation of the air tubes (bronchi) between the windpipe and the lungs. In adults, acute bronchitis usually goes away within 2 weeks, although coughing may last 3 weeks or longer  Take over-the-counter and prescription medicines only  as told by your health care provider.  Drink enough fluid to keep your urine pale yellow.  Contact a health care provider if your symptoms do not improve after 2 weeks of treatment.  Get help right away if you cough up blood, faint, or have chest pain or shortness of breath. This information is not intended to replace advice given to you by your health care provider. Make sure you discuss any questions you have with your health care provider. Document Revised: 07/16/2019 Document Reviewed: 05/25/2019 Elsevier Patient Education  [place]. Eustachian Tube Dysfunction  Eustachian tube dysfunction refers to a condition in which a blockage develops in the narrow passage that connects the middle ear to the back of the nose (eustachian tube). The eustachian tube regulates air pressure in the middle ear by letting air move between the ear and nose. It also helps to drain fluid from the middle ear space. Eustachian tube dysfunction can affect one or both ears. When the eustachian tube does not function properly, air pressure, fluid,  or both can build up in the middle ear. What are the causes? This condition occurs when the eustachian tube becomes blocked or cannot open normally. Common causes of this condition include:  Ear infections.  Colds and other infections that affect the nose, mouth, and throat (upper respiratory tract).  Allergies.  Irritation from cigarette smoke.  Irritation from stomach acid coming up into the esophagus (gastroesophageal reflux). The esophagus is the tube that carries food from the mouth to the stomach.  Sudden changes in air pressure, such as from descending in an airplane or scuba diving.  Abnormal growths in the nose or throat, such as: ? Growths that line the nose (nasal polyps). ? Abnormal growth of cells (tumors). ? Enlarged tissue at the back of the throat (adenoids). What increases the risk? You are more likely to develop this condition if:  You smoke.  You are overweight.  You are a child who has: ? Certain birth defects of the mouth, such as cleft palate. ? Large tonsils or adenoids. What are the signs or symptoms? Common symptoms of this condition include:  A feeling of fullness in the ear.  Ear pain.  Clicking or popping noises in the ear.  Ringing in the ear.  Hearing loss.  Loss of balance.  Dizziness. Symptoms may get worse when the air pressure around you changes, such as when you travel to an area of high elevation, fly on an airplane, or go scuba diving. How is this diagnosed? This condition may be diagnosed based on:  Your symptoms.  A physical exam of your ears, nose, and throat.  Tests, such as those that measure: ? The movement of your eardrum (tympanogram). ? Your hearing (audiometry). How is this treated? Treatment depends on the cause and severity of your condition.  In mild cases, you may relieve your symptoms by moving air into your ears. This is called "popping the ears."  In more severe cases, or if you have symptoms of fluid  in your ears, treatment may include: ? Medicines to relieve congestion (decongestants). ? Medicines that treat allergies (antihistamines). ? Nasal sprays or ear drops that contain medicines that reduce swelling (steroids). ? A procedure to drain the fluid in your eardrum (myringotomy). In this procedure, a small tube is placed in the eardrum to:  Drain the fluid.  Restore the air in the middle ear space. ? A procedure to insert a balloon device through the nose to inflate  the opening of the eustachian tube (balloon dilation). Follow these instructions at home: Lifestyle  Do not do any of the following until your health care provider approves: ? Travel to high altitudes. ? Fly in airplanes. ? Work in a [employer] scheme manager or room. ? Scuba dive.  Do not use any products that contain nicotine or tobacco, such as cigarettes and e-cigarettes. If you need help quitting, ask your health care provider.  Keep your ears dry. Wear fitted earplugs during showering and bathing. Dry your ears completely after. General instructions  Take over-the-counter and prescription medicines only as told by your health care provider.  Use techniques to help pop your ears as recommended by your health care provider. These may include: ? Chewing gum. ? Yawning. ? Frequent, forceful swallowing. ? Closing your mouth, holding your nose closed, and gently blowing as if you are trying to blow air out of your nose.  Keep all follow-up visits as told by your health care provider. This is important. Contact a health care provider if:  Your symptoms do not go away after treatment.  Your symptoms come back after treatment.  You are unable to pop your ears.  You have: ? A fever. ? Pain in your ear. ? Pain in your head or neck. ? Fluid draining from your ear.  Your hearing suddenly changes.  You become very dizzy.  You lose your balance. Summary  Eustachian tube dysfunction refers to a condition in which  a blockage develops in the eustachian tube.  It can be caused by ear infections, allergies, inhaled irritants, or abnormal growths in the nose or throat.  Symptoms include ear pain, hearing loss, or ringing in the ears.  Mild cases are treated with maneuvers to unblock the ears, such as yawning or ear popping.  Severe cases are treated with medicines. Surgery may also be done (rare). This information is not intended to replace advice given to you by your health care provider. Make sure you discuss any questions you have with your health care provider. Document Revised: 02/21/2018 Document Reviewed: 02/21/2018 Elsevier Patient Education  [place]. Allergic Rhinitis, Adult Allergic rhinitis is an allergic reaction that affects the mucous membrane inside the nose. It causes sneezing, a runny or stuffy nose, and the feeling of mucus going down the back of the throat (postnasal drip). Allergic rhinitis can be mild to severe. There are two types of allergic rhinitis:  Seasonal. This type is also called hay fever. It happens only during certain seasons.  Perennial. This type can happen at any time of the year. What are the causes? This condition happens when the body's defense system (immune system) responds to certain harmless substances called allergens as though they were germs.  Seasonal allergic rhinitis is triggered by pollen, which can come from grasses, trees, and weeds. Perennial allergic rhinitis may be caused by:  House dust mites.  Pet dander.  Mold spores. What are the signs or symptoms? Symptoms of this condition include:  Sneezing.  Runny or stuffy nose (nasal congestion).  Postnasal drip.  Itchy nose.  Tearing of the eyes.  Trouble sleeping.  Daytime sleepiness. How is this diagnosed? This condition may be diagnosed based on:  Your medical history.  A physical exam.  Tests to check for related conditions, such as: ? Asthma. ? Pink eye. ? Ear  infection. ? Upper respiratory infection.  Tests to find out which allergens trigger your symptoms. These may include skin or blood tests. How is this  treated? There is no cure for this condition, but treatment can help control symptoms. Treatment may include:  Taking medicines that block allergy symptoms, such as antihistamines. Medicine may be given as a shot, nasal spray, or pill.  Avoiding the allergen.  Desensitization. This treatment involves getting ongoing shots until your body becomes less sensitive to the allergen. This treatment may be done if other treatments do not help.  If taking medicine and avoiding the allergen does not work, new, stronger medicines may be prescribed. Follow these instructions at home:  Find out what you are allergic to. Common allergens include smoke, dust, and pollen.  Avoid the things you are allergic to. These are some things you can do to help avoid allergens: ? Replace carpet with wood, tile, or vinyl flooring. Carpet can trap dander and dust. ? Do not smoke. Do not allow smoking in your home. ? Change your heating and air conditioning filter at least once a month. ? During allergy season:  Keep windows closed as much as possible.  Plan outdoor activities when pollen counts are lowest. This is usually during the evening hours.  When coming indoors, change clothing and shower before sitting on furniture or bedding.  Take over-the-counter and prescription medicines only as told by your health care provider.  Keep all follow-up visits as told by your health care provider. This is important. Contact a health care provider if:  You have a fever.  You develop a persistent cough.  You make whistling sounds when you breathe (you wheeze).  Your symptoms interfere with your normal daily activities. Get help right away if:  You have shortness of breath. Summary  This condition can be managed by taking medicines as directed and avoiding  allergens.  Contact your health care provider if you develop a persistent cough or fever.  During allergy season, keep windows closed as much as possible. This information is not intended to replace advice given to you by your health care provider. Make sure you discuss any questions you have with your health care provider. Document Revised: 10/14/2017 Document Reviewed: 12/09/2016 Elsevier Patient Education  [place]. Pharyngitis  Pharyngitis is redness, pain, and swelling (inflammation) of the throat (pharynx). It is a very common cause of sore throat. Pharyngitis can be caused by a bacteria, but it is usually caused by a virus. Most cases of pharyngitis get better on their own without treatment. What are the causes? This condition may be caused by:  Infection by viruses (viral). Viral pharyngitis spreads from person to person (is contagious) through coughing, sneezing, and sharing of personal items or utensils such as cups, forks, spoons, and toothbrushes.  Infection by bacteria (bacterial). Bacterial pharyngitis may be spread by touching the nose or face after coming in contact with the bacteria, or through more intimate contact, such as kissing.  Allergies. Allergies can cause buildup of mucus in the throat (post-nasal drip), leading to inflammation and irritation. Allergies can also cause blocked nasal passages, forcing breathing through the mouth, which dries and irritates the throat. What increases the risk? You are more likely to develop this condition if:  You are 39-36 years old.  You are exposed to crowded environments such as daycare, school, or dormitory living.  You live in a cold climate.  You have a weakened disease-fighting (immune) system. What are the signs or symptoms? Symptoms of this condition vary by the cause (viral, bacterial, or allergies) and can include:  Sore throat.  Fatigue.  Low-grade fever.  Headache.  Joint pain and muscle  aches.  Skin rashes.  Swollen glands in the throat (lymph nodes).  Plaque-like film on the throat or tonsils. This is often a symptom of bacterial pharyngitis.  Vomiting.  Stuffy nose (nasal congestion).  Cough.  Red, itchy eyes (conjunctivitis).  Loss of appetite. How is this diagnosed? This condition is often diagnosed based on your medical history and a physical exam. Your health care provider will ask you questions about your illness and your symptoms. A swab of your throat may be done to check for bacteria (rapid strep test). Other lab tests may also be done, depending on the suspected cause, but these are rare. How is this treated? This condition usually gets better in 3-4 days without medicine. Bacterial pharyngitis may be treated with antibiotic medicines. Follow these instructions at home:  Take over-the-counter and prescription medicines only as told by your health care provider. ? If you were prescribed an antibiotic medicine, take it as told by your health care provider. Do not stop taking the antibiotic even if you start to feel better. ? Do not give children aspirin because of the association with Reye syndrome.  Drink enough water and fluids to keep your urine clear or pale yellow.  Get a lot of rest.  Gargle with a salt-water mixture 3-4 times a day or as needed. To make a salt-water mixture, completely dissolve -1 tsp of salt in 1 cup of warm water.  If your health care provider approves, you may use throat lozenges or sprays to soothe your throat. Contact a health care provider if:  You have large, tender lumps in your neck.  You have a rash.  You cough up green, yellow-brown, or bloody spit. Get help right away if:  Your neck becomes stiff.  You drool or are unable to swallow liquids.  You cannot drink or take medicines without vomiting.  You have severe pain that does not go away, even after you take medicine.  You have trouble breathing, and it  is not caused by a stuffy nose.  You have new pain and swelling in your joints such as the knees, ankles, wrists, or elbows. Summary  Pharyngitis is redness, pain, and swelling (inflammation) of the throat (pharynx).  While pharyngitis can be caused by a bacteria, the most common causes are viral.  Most cases of pharyngitis get better on their own without treatment.  Bacterial pharyngitis is treated with antibiotic medicines. This information is not intended to replace advice given to you by your health care provider. Make sure you discuss any questions you have with your health care provider. Document Revised: 10/14/2017 Document Reviewed: 12/07/2016 Elsevier Patient Education  2020 [employer].

## 2020-09-11 ENCOUNTER — Other Ambulatory Visit: Payer: Self-pay

## 2020-09-11 ENCOUNTER — Encounter: Payer: Self-pay | Admitting: Registered Nurse

## 2020-09-11 ENCOUNTER — Ambulatory Visit: Payer: Self-pay | Admitting: Registered Nurse

## 2020-09-11 VITALS — BP 135/93 | HR 84 | Temp 98.3°F

## 2020-09-11 DIAGNOSIS — J00 Acute nasopharyngitis [common cold]: Secondary | ICD-10-CM

## 2020-09-11 DIAGNOSIS — J209 Acute bronchitis, unspecified: Secondary | ICD-10-CM

## 2020-09-11 MED ORDER — ALBUTEROL SULFATE HFA 108 (90 BASE) MCG/ACT IN AERS: 1.0000 | INHALATION_SPRAY | Freq: Four times a day (QID) | RESPIRATORY_TRACT | 0 refills | Status: DC | PRN

## 2020-09-11 MED ORDER — PHENYLEPHRINE HCL 5 MG PO TABS: 5.0000 mg | ORAL_TABLET | Freq: Four times a day (QID) | ORAL | Status: AC | PRN

## 2020-09-11 NOTE — Patient Instructions (Signed)
Nonallergic Rhinitis Nonallergic rhinitis is a condition that causes symptoms that affect the nose, such as a runny nose and a stuffed-up nose (nasal congestion) that can make it hard to breathe through the nose. This condition is different from having an allergy (allergic rhinitis). Allergic rhinitis occurs when the body's defense system (immune system) reacts to a substance that you are allergic to (allergen), such as pollen, pet dander, mold, or dust. Nonallergic rhinitis has many similar symptoms, but it is not caused by allergens. Nonallergic rhinitis can be a short-term or long-term problem. What are the causes? This condition can be caused by many different things. Some common types of nonallergic rhinitis include: Infectious rhinitis  This is usually due to an infection in the upper respiratory tract. Vasomotor rhinitis  This is the most common type of long-term nonallergic rhinitis.  It is caused by too much blood flow through the nose, which makes the tissue inside of the nose swell.  Symptoms are often triggered by strong odors, cold air, stress, drinking alcohol, cigarette smoke, or changes in the weather. Occupational rhinitis  This type is caused by triggers in the workplace, such as chemicals, dusts, animal dander, or air pollution. Hormonal rhinitis  This type occurs in women as a result of an increase in the female hormone estrogen.  It may occur during pregnancy, puberty, and menstrual cycles.  Symptoms improve when estrogen levels drop. Drug-induced rhinitis Several drugs can cause nonallergic rhinitis, including:  Medicines that are used to treat high blood pressure, heart disease, and Parkinson disease.  Aspirin and NSAIDs.  Over-the-counter nasal decongestant sprays. These can cause a type of nonallergic rhinitis (rhinitis medicamentosa) when they are used for more than a few days. Nonallergic rhinitis with eosinophilia syndrome (NARES)  This type is caused by  having too much of a certain type of white blood cell (eosinophil). Nonallergic rhinitis can also be caused by a reaction to eating hot or spicy foods. This does not usually cause long-term symptoms. In some cases, the cause of nonallergic rhinitis is not known. What increases the risk? You are more likely to develop this condition if:  You are 73-37 years of age.  You are a woman. Women are twice as likely to have this condition. What are the signs or symptoms? Common symptoms of this condition include:  Nasal congestion.  Runny nose.  The feeling of mucus going down the back of the throat (postnasal drip).  Trouble sleeping at night and daytime sleepiness. Less common symptoms include:  Sneezing.  Coughing.  Itchy nose.  Bloodshot eyes. How is this diagnosed? This condition may be diagnosed based on:  Your symptoms and medical history.  A physical exam.  Allergy testing to rule out allergic rhinitis. You may have skin tests or blood tests. In some cases, the health care provider may take a swab of nasal secretions to look for an increased number of eosinophils. This would be done to confirm a diagnosis of NARES. How is this treated? Treatment for this condition depends on the cause. No single treatment works for everyone. Work with your health care provider to find the best treatment for you. Treatment may include:  Avoiding the things that trigger your symptoms.  Using medicines to relieve congestion, such as: ? Steroid nasal spray. There are many types. You may need to try a few to find out which one works best. ? Decongestant medicine. This may be an oral medicine or a nasal spray. These medicines are only used for  a short time.  Using medicines to relieve a runny nose. These may include antihistamine medicines or anticholinergic nasal sprays.  Surgery to remove tissue from inside the nose may be needed in severe cases if the condition has not improved after 6-12  months of medical treatment. Follow these instructions at home:  Take or use over-the-counter and prescription medicines only as told by your health care provider. Do not stop using your medicine even if you start to feel better.  Use salt-water (saline) rinses or other solutions (nasal washes or irrigations) to wash or rinse out the inside of your nose as told by your health care provider.  Do not take NSAIDs or medicines that contain aspirin if they make your symptoms worse.  Do not drink alcohol if it makes your symptoms worse.  Do not use any tobacco products, such as cigarettes, chewing tobacco, and e-cigarettes. If you need help quitting, ask your health care provider.  Avoid secondhand smoke.  Get some exercise every day. Exercise may help reduce symptoms of nonallergic rhinitis for some people. Ask your health care provider how much exercise and what types of exercise are safe for you.  Sleep with the head of your bed raised (elevated). This may reduce nighttime nasal congestion.  Keep all follow-up visits as told by your health care provider. This is important. Contact a health care provider if:  You have a fever.  Your symptoms are getting worse at home.  Your symptoms are not responding to medicine.  You develop new symptoms, especially a headache or nosebleed. This information is not intended to replace advice given to you by your health care provider. Make sure you discuss any questions you have with your health care provider. Document Revised: 10/14/2017 Document Reviewed: 01/22/2016 Elsevier Patient Education  [place]. How to Use a Metered Dose Inhaler A metered dose inhaler is a handheld device for taking medicine that must be breathed into the lungs (inhaled). The device can be used to deliver a variety of inhaled medicines, including:  Quick relief or rescue medicines, such as bronchodilators.  Controller medicines, such as corticosteroids. The  medicine is delivered by pushing down on a metal canister to release a preset amount of spray and medicine. Each device contains the amount of medicine that is needed for a preset number of uses (inhalations). Your health care provider may recommend that you use a spacer with your inhaler to help you take the medicine more effectively. A spacer is a plastic tube with a mouthpiece on one end and an opening that connects to the inhaler on the other end. A spacer holds the medicine in a tube for a short time, which allows you to inhale more medicine. What are the risks? If you do not use your inhaler correctly, medicine might not reach your lungs to help you breathe. Inhaler medicine can cause side effects, such as:  Mouth or throat infection.  Cough.  Hoarseness.  Headache.  Nausea and vomiting.  Lung infection (pneumonia) in people who have a lung condition called COPD. How to use a metered dose inhaler without a spacer  1. Remove the cap from the inhaler. 2. If you are using the inhaler for the first time, shake it for 5 seconds, turn it away from your face, then release 4 puffs into the air. This is called priming. 3. Shake the inhaler for 5 seconds. 4. Position the inhaler so the top of the canister faces up. 5. Put your index finger  on the top of the medicine canister. Support the bottom of the inhaler with your thumb. 6. Breathe out normally and as completely as possible, away from the inhaler. 7. Either place the inhaler between your teeth and close your lips tightly around the mouthpiece, or hold the inhaler 1-2 inches (2.5-5 cm) away from your open mouth. Keep your tongue down out of the way. If you are unsure which technique to use, ask your health care provider. 8. Press the canister down with your index finger to release the medicine, then inhale deeply and slowly through your mouth (not your nose) until your lungs are completely filled. Inhaling should take 4-6 seconds. 9. Hold  the medicine in your lungs for 5-10 seconds (10 seconds is best). This helps the medicine get into the small airways of your lungs. 10. With your lips in a tight circle (pursed), breathe out slowly. 11. Repeat steps 3-10 until you have taken the number of puffs that your health care provider directed. Wait about 1 minute between puffs or as directed. 12. Put the cap on the inhaler. 13. If you are using a steroid inhaler, rinse your mouth with water, gargle, and spit out the water. Do not swallow the water. How to use a metered dose inhaler with a spacer  1. Remove the cap from the inhaler. 2. If you are using the inhaler for the first time, shake it for 5 seconds, turn it away from your face, then release 4 puffs into the air. This is called priming. 3. Shake the inhaler for 5 seconds. 4. Place the open end of the spacer onto the inhaler mouthpiece. 5. Position the inhaler so the top of the canister faces up and the spacer mouthpiece faces you. 6. Put your index finger on the top of the medicine canister. Support the bottom of the inhaler and the spacer with your thumb. 7. Breathe out normally and as completely as possible, away from the spacer. 8. Place the spacer between your teeth and close your lips tightly around it. Keep your tongue down out of the way. 9. Press the canister down with your index finger to release the medicine, then inhale deeply and slowly through your mouth (not your nose) until your lungs are completely filled. Inhaling should take 4-6 seconds. 10. Hold the medicine in your lungs for 5-10 seconds (10 seconds is best). This helps the medicine get into the small airways of your lungs. 11. With your lips in a tight circle (pursed), breathe out slowly. 12. Repeat steps 3-11 until you have taken the number of puffs that your health care provider directed. Wait about 1 minute between puffs or as directed. 13. Remove the spacer from the inhaler and put the cap on the  inhaler. 14. If you are using a steroid inhaler, rinse your mouth with water, gargle, and spit out the water. Do not swallow the water. Follow these instructions at home:  Take your inhaled medicine only as told by your health care provider. Do not use the inhaler more than directed by your health care provider.  Keep all follow-up visits as told by your health care provider. This is important.  If your inhaler has a counter, you can check it to determine how full your inhaler is. If your inhaler does not have a counter, ask your health care provider when you will need to refill your inhaler and write the refill date on a calendar or on your inhaler canister. Note that you cannot know  when an inhaler is empty by shaking it.  Follow directions on the package insert for care and cleaning of your inhaler and spacer. Contact a health care provider if:  Symptoms are only partially relieved with your inhaler.  You are having trouble using your inhaler.  You have an increase in phlegm.  You have headaches. Get help right away if:  You feel little or no relief after using your inhaler.  You have dizziness.  You have a fast heart rate.  You have chills or a fever.  You have night sweats.  There is blood in your phlegm. Summary  A metered dose inhaler is a handheld device for taking medicine that must be breathed into the lungs (inhaled).  The medicine is delivered by pushing down on a metal canister to release a preset amount of spray and medicine.  Each device contains the amount of medicine that is needed for a preset number of uses (inhalations). This information is not intended to replace advice given to you by your health care provider. Make sure you discuss any questions you have with your health care provider. Document Revised: 10/14/2017 Document Reviewed: 09/21/2016 Elsevier Patient Education  [place]. Acute Bronchitis, Adult  Acute bronchitis is sudden or  acute swelling of the air tubes (bronchi) in the lungs. Acute bronchitis causes these tubes to fill with mucus, which can make it hard to breathe. It can also cause coughing or wheezing. In adults, acute bronchitis usually goes away within 2 weeks. A cough caused by bronchitis may last up to 3 weeks. Smoking, allergies, and asthma can make the condition worse. What are the causes? This condition can be caused by germs and by substances that irritate the lungs, including:  Cold and flu viruses. The most common cause of this condition is the virus that causes the common cold.  Bacteria.  Substances that irritate the lungs, including: ? Smoke from cigarettes and other forms of tobacco. ? Dust and pollen. ? Fumes from chemical products, gases, or burned fuel. ? Other materials that pollute indoor or outdoor air.  Close contact with someone who has acute bronchitis. What increases the risk? The following factors may make you more likely to develop this condition:  A weak body's defense system, also called the immune system.  A condition that affects your lungs and breathing, such as asthma. What are the signs or symptoms? Common symptoms of this condition include:  Lung and breathing problems, such as: ? Coughing. This may bring up clear, yellow, or green mucus from your lungs (sputum). ? Wheezing. ? Having too much mucus in your lungs (chest congestion). ? Having shortness of breath.  A fever.  Chills.  Aches and pains, including: ? Tightness in your chest and other body aches. ? A sore throat. How is this diagnosed? This condition is usually diagnosed based on:  Your symptoms and medical history.  A physical exam. You may also have other tests, including tests to rule out other conditions, such pneumonia. These tests include:  A test of lung function.  Test of a mucus sample to look for the presence of bacteria.  Tests to check the oxygen level in your blood.  Blood  tests.  Chest X-ray. How is this treated? Most cases of acute bronchitis clear up over time without treatment. Your health care provider may recommend:  Drinking more fluids. This can thin your mucus, which may improve your breathing.  Taking a medicine for a fever or cough.  Using a device that gets medicine into your lungs (inhaler) to help improve breathing and control coughing.  Using a vaporizer or a humidifier. These are machines that add water to the air to help you breathe better. Follow these instructions at home: Activity  Get plenty of rest.  Return to your normal activities as told by your health care provider. Ask your health care provider what activities are safe for you. Lifestyle  Drink enough fluid to keep your urine pale yellow.  Do not drink alcohol.  Do not use any products that contain nicotine or tobacco, such as cigarettes, e-cigarettes, and chewing tobacco. If you need help quitting, ask your health care provider. Be aware that: ? Your bronchitis will get worse if you smoke or breathe in other people's smoke (secondhand smoke). ? Your lungs will heal faster if you quit smoking. General instructions   Take over-the-counter and prescription medicines only as told by your health care provider.  Use an inhaler, vaporizer, or humidifier as told by your health care provider.  If you have a sore throat, gargle with a salt-water mixture 3-4 times a day or as needed. To make a salt-water mixture, completely dissolve -1 tsp (3-6 g) of salt in 1 cup (237 mL) of warm water.  Keep all follow-up visits as told by your health care provider. This is important. How is this prevented? To lower your risk of getting this condition again:  Wash your hands often with soap and water. If soap and water are not available, use hand sanitizer.  Avoid contact with people who have cold symptoms.  Try not to touch your mouth, nose, or eyes with your hands.  Avoid places  where there are fumes from chemicals. Breathing these fumes will make your condition worse.  Get the flu shot every year. Contact a health care provider if:  Your symptoms do not improve after 2 weeks of treatment.  You vomit more than once or twice.  You have symptoms of dehydration such as: ? Dark urine. ? Dry skin or eyes. ? Increased thirst. ? Headaches. ? Confusion. ? Muscle cramps. Get help right away if you:  Cough up blood.  Feel pain in your chest.  Have severe shortness of breath.  Faint or keep feeling like you are going to faint.  Have a severe headache.  Have fever or chills that get worse. These symptoms may represent a serious problem that is an emergency. Do not wait to see if the symptoms will go away. Get medical help right away. Call your local emergency services (911 in the [place] Do not drive yourself to the hospital. Summary  Acute bronchitis is sudden (acute) inflammation of the air tubes (bronchi) between the windpipe and the lungs. In adults, acute bronchitis usually goes away within 2 weeks, although coughing may last 3 weeks or longer  Take over-the-counter and prescription medicines only as told by your health care provider.  Drink enough fluid to keep your urine pale yellow.  Contact a health care provider if your symptoms do not improve after 2 weeks of treatment.  Get help right away if you cough up blood, faint, or have chest pain or shortness of breath. This information is not intended to replace advice given to you by your health care provider. Make sure you discuss any questions you have with your health care provider. Document Revised: 07/16/2019 Document Reviewed: 05/25/2019 Elsevier Patient Education  [place].

## 2020-09-11 NOTE — Progress Notes (Signed)
Subjective:    Patient ID: Sara Cook, female    DOB: [birth year], 54 y.o.   MRN: [id number]  54y/o Caucasian established female pt presenting for f/u of cough, ShOB. Started 40mg  Prednisone daily, one dose so far, yesterday. Has not taken yet today due to side effects. Cough continues with clear mucus. Cough kept her awake last night. Felt okay going to bed, but once she lied down, cough resumed. Using Claritin, Delsym, and Tessalon pearles at home still prn cough.  Patient reported trouble sleeping, felt like heart racing and pounding after work yesterday and sweating with exertion more than usual at work yesterday after taking 40mg  prednisone dose with breakfast.  She had tolerated 20mg  prednisone burst earlier this year without difficulty but does not want to take any more 40mg  doses.  Patient denied fever/chills/n/v/d/headache/body aches/teeth pain/sinus pressure or pain.  Denied worsening of cough or changes in color/amount of mucous.     Review of Systems  Constitutional: Positive for fatigue. Negative for activity change, appetite change, chills, diaphoresis and fever.  HENT: Positive for postnasal drip and rhinorrhea. Negative for dental problem, drooling, ear discharge, facial swelling, hearing loss, mouth sores, nosebleeds, sinus pressure, sinus pain, sneezing, tinnitus, trouble swallowing and voice change.   Eyes: Negative for photophobia and visual disturbance.  Respiratory: Positive for cough and shortness of breath. Negative for choking and stridor.   Cardiovascular: Negative for palpitations and leg swelling.  Gastrointestinal: Negative for constipation, diarrhea, nausea and vomiting.  Endocrine: Negative for cold intolerance and heat intolerance.  Genitourinary: Negative for difficulty urinating.  Musculoskeletal: Negative for back pain, gait problem, myalgias, neck pain and neck stiffness.  Skin: Negative for color change and rash.  Allergic/Immunologic: Positive for  environmental allergies. Negative for food allergies.  Neurological: Negative for dizziness, tremors, seizures, syncope, facial asymmetry, speech difficulty, weakness, light-headedness, numbness and headaches.  Hematological: Negative for adenopathy. Does not bruise/bleed easily.  Psychiatric/Behavioral: Positive for sleep disturbance. Negative for agitation and confusion. The patient is nervous/anxious.        Objective:   Physical Exam Vitals and nursing note reviewed.  Constitutional:      General: She is awake. She is not in acute distress.    Appearance: Normal appearance. She is well-developed and well-groomed. She is obese. She is not ill-appearing, toxic-appearing or diaphoretic.     Comments: Wearing short sleeve shoirt/pants/tennies and silver polishing jacket over her clothes  HENT:     Head: Normocephalic and atraumatic.     Jaw: There is normal jaw occlusion. No trismus.     Salivary Glands: Right salivary gland is not diffusely enlarged or tender. Left salivary gland is not diffusely enlarged or tender.     Right Ear: Hearing, ear canal and external ear normal. A middle ear effusion is present. There is no impacted cerumen.     Left Ear: Hearing, ear canal and external ear normal. A middle ear effusion is present. There is no impacted cerumen.     Nose: Mucosal edema and rhinorrhea present. No nasal deformity, septal deviation, laceration or congestion. Rhinorrhea is clear.     Right Turbinates: Not enlarged, swollen or pale.     Left Turbinates: Not enlarged, swollen or pale.     Right Sinus: No maxillary sinus tenderness or frontal sinus tenderness.     Left Sinus: No maxillary sinus tenderness or frontal sinus tenderness.     Mouth/Throat:     Lips: Pink. No lesions.     Mouth: Mucous membranes are  moist. Mucous membranes are not pale, not dry and not cyanotic. No injury, lacerations, oral lesions or angioedema.     Dentition: Normal dentition. Does not have dentures. No  dental tenderness, gingival swelling, dental caries, dental abscesses or gum lesions.     Tongue: No lesions. Tongue does not deviate from midline.     Palate: No mass and lesions.     Pharynx: Uvula midline. Pharyngeal swelling and posterior oropharyngeal erythema present. No oropharyngeal exudate or uvula swelling.     Tonsils: No tonsillar exudate or tonsillar abscesses. 0 on the right. 0 on the left.     Comments: Cobblestoning posterior pharynx; bilateral allergic shiners; bilateral TMs air fluid level clear; clear discharge bilateral nares/nasal sniffing noted Eyes:     General: Lids are normal. Vision grossly intact. Gaze aligned appropriately. Allergic shiner present. No visual field deficit or scleral icterus.       Right eye: No foreign body, discharge or hordeolum.        Left eye: No foreign body, discharge or hordeolum.     Extraocular Movements: Extraocular movements intact.     Right eye: Normal extraocular motion and no nystagmus.     Left eye: Normal extraocular motion and no nystagmus.     Conjunctiva/sclera: Conjunctivae normal.     Right eye: Right conjunctiva is not injected. No chemosis, exudate or hemorrhage.    Left eye: Left conjunctiva is not injected. No chemosis, exudate or hemorrhage.    Pupils: Pupils are equal, round, and reactive to light. Pupils are equal.     Right eye: Pupil is round and reactive.     Left eye: Pupil is round and reactive.  Neck:     Thyroid: No thyroid mass, thyromegaly or thyroid tenderness.     Trachea: Trachea and phonation normal. No tracheal tenderness or tracheal deviation.  Cardiovascular:     Rate and Rhythm: Normal rate and regular rhythm.     Chest Wall: PMI is not displaced.     Pulses: Normal pulses.          Radial pulses are 2+ on the right side and 2+ on the left side.     Heart sounds: Normal heart sounds, S1 normal and S2 normal. No murmur heard.  No friction rub. No gallop.   Pulmonary:     Effort: Pulmonary effort  is normal. No accessory muscle usage or respiratory distress.     Breath sounds: Normal breath sounds and air entry. No stridor, decreased air movement or transmitted upper airway sounds. No decreased breath sounds, wheezing, rhonchi or rales.     Comments: No cough in exam room; spoke full sentences without difficulty; wearing disposable surgical mask due to covid 19 pandemic; sp02 steady 98% RA Chest:     Chest wall: No tenderness.  Abdominal:     General: There is no distension.     Palpations: Abdomen is soft.  Musculoskeletal:        General: No swelling, tenderness, deformity or signs of injury. Normal range of motion.     Right shoulder: Normal. No swelling, deformity, effusion or laceration. Normal range of motion.     Left shoulder: Normal. No swelling, deformity, effusion or laceration. Normal range of motion.     Right elbow: Normal. No swelling, deformity, effusion or lacerations.     Left elbow: Normal. No swelling, deformity, effusion or lacerations.     Right hand: Normal. No swelling, deformity or lacerations.     Left hand:  Normal. No swelling, deformity or lacerations.     Cervical back: Normal, normal range of motion and neck supple. No swelling, edema, deformity, erythema, signs of trauma, lacerations, rigidity, spasms, torticollis, tenderness or crepitus. No pain with movement or muscular tenderness. Normal range of motion.     Thoracic back: Normal. No swelling, edema, deformity, signs of trauma or lacerations.     Lumbar back: No swelling, edema, deformity, signs of trauma or lacerations. Normal range of motion.     Right hip: Normal. No deformity or lacerations. Normal range of motion.     Left hip: Normal. No deformity or lacerations.     Right knee: Normal. No swelling, deformity, effusion, erythema, ecchymosis or lacerations.     Left knee: Normal. No swelling, deformity, effusion, erythema, ecchymosis or lacerations.     Right lower leg: No edema.     Left lower  leg: No edema.  Lymphadenopathy:     Head:     Right side of head: No submental, submandibular, tonsillar, preauricular, posterior auricular or occipital adenopathy.     Left side of head: No submental, submandibular, tonsillar, preauricular, posterior auricular or occipital adenopathy.     Cervical: No cervical adenopathy.     Right cervical: No superficial, deep or posterior cervical adenopathy.    Left cervical: No superficial, deep or posterior cervical adenopathy.  Skin:    General: Skin is warm and dry.     Capillary Refill: Capillary refill takes less than 2 seconds.     Coloration: Skin is not ashen, cyanotic, jaundiced, mottled, pale or sallow.     Findings: No abrasion, abscess, acne, bruising, burn, ecchymosis, erythema, signs of injury, laceration, lesion, petechiae, rash or wound.     Nails: There is no clubbing.  Neurological:     General: No focal deficit present.     Mental Status: She is alert and oriented to person, place, and time. Mental status is at baseline. She is not disoriented.     GCS: GCS eye subscore is 4. GCS verbal subscore is 5. GCS motor subscore is 6.     Cranial Nerves: Cranial nerves are intact. No cranial nerve deficit, dysarthria or facial asymmetry.     Sensory: Sensation is intact. No sensory deficit.     Motor: Motor function is intact. No weakness, tremor, atrophy, abnormal muscle tone or seizure activity.     Coordination: Coordination is intact. Coordination normal.     Gait: Gait is intact. Gait normal.     Comments: Gait sure and steady in clinic; in/out of chair and on/off exam table without difficulty; bilateral hand grasp equal 5/5  Psychiatric:        Attention and Perception: Attention and perception normal.        Mood and Affect: Mood and affect normal.        Speech: Speech normal.        Behavior: Behavior normal. Behavior is cooperative.        Thought Content: Thought content normal.        Cognition and Memory: Cognition and  memory normal.        Judgment: Judgment normal.           Assessment & Plan:  A-acute bronchitis/rhinitis subsequent visit  P-stop prednisone 40mg  burst due to side effects even though BBS improved and sp02 improved 98% steady with talking.  May continue delsym, honey, tessalon pearles.  Discussed tomorrow, Sat or Sun if feeling like sinuses/cough worsening could take 20mg   prednisone x 2 days she has 4 10mg  pills left at home.  Given phenylephrine 4 UD packages from clinic stock. Electronic Rx Albuterol MDI 41mcg 1-2 puffs po q4-6h prn protracted cough/wheeze #1 RF0 side effect increased heart rate, hand tremors after use. Bronchitis simple, community acquired, may have started as viral (probably respiratory syncytial, parainfluenza, influenza, or adenovirus), but now evidence of acute purulent bronchitis with resultant bronchial edema and mucus formation.  Viruses are the most common cause of bronchial inflammation in otherwise healthy adults with acute bronchitis.  The appearance of sputum is not predictive of whether a bacterial infection is present.  Purulent sputum is most often caused by viral infections.  There are a small portion of those caused by non-viral agents being Mycoplama pneumonia.  Microscopic examination or C&S of sputum in the healthy adult with acute bronchitis is generally not helpful (usually negative or normal respiratory flora) other considerations being cough from upper respiratory tract infections, sinusitis or allergic syndromes (mild asthma or viral pneumonia).  Differential Diagnoses:  reactive airway disease (asthma, allergic aspergillosis (eosinophilia), chronic bronchitis, respiratory infection (sinusitis, common cold, pneumonia), congestive heart failure, reflux esophagitis, bronchogenic tumor, aspiration syndromes and/or exposure to pulmonary irritants/smoke.  Without high fever, severe dyspnea, lack of physical findings or other risk factors, I will hold on a chest  radiograph and CBC at this time.  I discussed that approximately 50% of patients with acute bronchitis have a cough that lasts up to three weeks, and 25% for over a month  No aspirin. [employer] handouts on bronchitis and inhaler use printed and given to patient.  ER if hemopthysis, SOB, worst chest pain of life. If trouble using her inhaler bring to clinic tomorrow prior to 1200 and RN Hildred Alamin will demonstrated with her.  RN Hildred Alamin demonstrated inhaler use to patient in clinic today.    Patient instructed to follow up prn if symptoms worsen or no relief with plan of care.  Patient verbalized agreement and understanding of treatment plan.  P2:  hand washing and cover cough  Discussed post nasal drip most likely allergy related or cold temperature rhinitis restart otc oral allergy medication once a day.  Storm systems have passed through area earlier this week and another coming through in a couple hours/windy/many leaves/rain.  Discussed post nasal drip continuing restart phenylephrine.  May see RN Hildred Alamin tomorrow if she needs refill. Phenylephrine 5-10mg  po q6h prn rhinitis.  Continue OTC antihistamine of choice claritin/zyrtec 10mg  po daily.  Avoid triggers if possible.  Shower prior to bedtime if exposed to triggers.  If allergic dust/dust mites recommend mattress/pillow covers/encasements; washing linens, vacuuming, sweeping, dusting weekly.  Call or return to clinic as needed if these symptoms worsen or fail to improve as anticipated.   Patient verbalized understanding of instructions, agreed with plan of care and had no further questions at this time.  P2:  Avoidance and hand washing.

## 2020-09-19 NOTE — Telephone Encounter (Signed)
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  Respirations even and unlabored walking across warehouse, no cough/nasal sniffing/throat clearing, skin warm dry and pink A&Ox3.  Patient verbalized understanding information/instructions, agreed with plan of care and had no further questions at this time.

## 2021-01-22 ENCOUNTER — Encounter: Payer: Self-pay | Admitting: Registered Nurse

## 2021-01-22 ENCOUNTER — Other Ambulatory Visit: Payer: Self-pay

## 2021-01-22 ENCOUNTER — Ambulatory Visit: Payer: Self-pay | Admitting: Registered Nurse

## 2021-01-22 VITALS — BP 157/97 | HR 86 | Temp 99.2°F

## 2021-01-22 DIAGNOSIS — J019 Acute sinusitis, unspecified: Secondary | ICD-10-CM

## 2021-01-22 DIAGNOSIS — R030 Elevated blood-pressure reading, without diagnosis of hypertension: Secondary | ICD-10-CM

## 2021-01-22 NOTE — Progress Notes (Signed)
Subjective:    Patient ID: Sara Cook, female    DOB: Jul 22, 1966, 55 y.o.   MRN: [id number]  54y/o Caucasian established female pt c/o HA and dizziness. Onset 29min PTA. Describes as cheeks burning, facial pressure. Also reports sore throat that began yesterday or the day before. Denies cough, ShOB, loss of taste or smell, body aches, fatigue. Took Tylenol 10 min PTA.  PMHx seasonal allergies not taking allegra yet this spring  Denied sick contacts.  Not wearing mask when out in public all the time.  covid x 2 no booster per Epic.  Symptoms don't typically feel like her regular menopause hot flashes as those body wide and this is only affecting her head.  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     Review of Systems  Constitutional: Negative for activity change, appetite change, chills, diaphoresis, fatigue and fever.  HENT: Positive for sinus pressure, sinus pain and sore throat. Negative for ear discharge, ear pain, facial swelling, trouble swallowing and voice change.   Eyes: Negative for photophobia and visual disturbance.  Respiratory: Negative for cough, choking, chest tightness, shortness of breath, wheezing and stridor.   Cardiovascular: Negative for chest pain and leg swelling.  Gastrointestinal: Negative for abdominal pain, diarrhea, nausea and vomiting.  Endocrine: Negative for cold intolerance and heat intolerance.  Genitourinary: Negative for difficulty urinating.  Musculoskeletal: Negative for back pain, gait problem, myalgias, neck pain and neck stiffness.  Allergic/Immunologic: Positive for environmental allergies. Negative for food allergies.  Neurological: Positive for dizziness and headaches. Negative for tremors, seizures, syncope, facial asymmetry, speech difficulty, weakness, light-headedness and numbness.  Hematological: Negative for adenopathy. Does  not bruise/bleed easily.  Psychiatric/Behavioral: Negative for behavioral problems, decreased concentration and sleep disturbance.       Objective:   Physical Exam Vitals and nursing note reviewed.  Constitutional:      General: She is awake. She is not in acute distress.    Appearance: Normal appearance. She is well-developed and well-groomed. She is obese. She is not ill-appearing, toxic-appearing or diaphoretic.  HENT:     Head: Normocephalic and atraumatic.     Jaw: There is normal jaw occlusion. No trismus.     Salivary Glands: Right salivary gland is not diffusely enlarged or tender. Left salivary gland is not diffusely enlarged or tender.     Right Ear: Hearing, ear canal and external ear normal. A middle ear effusion is present.     Left Ear: Hearing, ear canal and external ear normal. A middle ear effusion is present.     Nose: Mucosal edema and rhinorrhea present. No nasal deformity, septal deviation, laceration or congestion. Rhinorrhea is clear.     Right Turbinates: Enlarged and swollen. Not pale.     Left Turbinates: Enlarged and swollen. Not pale.     Right Sinus: No maxillary sinus tenderness or frontal sinus tenderness.     Left Sinus: No maxillary sinus tenderness or frontal sinus tenderness.     Mouth/Throat:     Lips: Pink. No lesions.     Mouth: Mucous membranes are moist. Mucous membranes are not pale, not dry and not cyanotic. No lacerations, oral lesions or angioedema.     Dentition: No dental caries, dental abscesses or gum lesions.     Tongue: No lesions. Tongue does not deviate from midline.     Palate: No mass and lesions.     Pharynx: Uvula midline.  Pharyngeal swelling and posterior oropharyngeal erythema present. No oropharyngeal exudate or uvula swelling.     Tonsils: No tonsillar exudate or tonsillar abscesses.     Comments: Cobblestoning posterior pharynx; bilateral allergic shiners; bilateral cheeks flushed dry; bilateral TMs air fluid level clear;  bilateral nasal turbinates clear discharge noted in nares Eyes:     General: Lids are normal. Vision grossly intact. Gaze aligned appropriately. Allergic shiner present. No visual field deficit or scleral icterus.       Right eye: No foreign body, discharge or hordeolum.        Left eye: No foreign body, discharge or hordeolum.     Extraocular Movements: Extraocular movements intact.     Right eye: Normal extraocular motion and no nystagmus.     Left eye: Normal extraocular motion and no nystagmus.     Conjunctiva/sclera: Conjunctivae normal.     Right eye: Right conjunctiva is not injected. No chemosis, exudate or hemorrhage.    Left eye: Left conjunctiva is not injected. No chemosis, exudate or hemorrhage.    Pupils: Pupils are equal, round, and reactive to light. Pupils are equal.     Right eye: Pupil is round and reactive.     Left eye: Pupil is round and reactive.  Neck:     Thyroid: No thyroid mass or thyromegaly.     Trachea: Trachea and phonation normal. No tracheal tenderness or tracheal deviation.  Cardiovascular:     Rate and Rhythm: Normal rate and regular rhythm.     Chest Wall: PMI is not displaced.     Pulses:          Radial pulses are 2+ on the right side and 2+ on the left side.     Heart sounds: Normal heart sounds, S1 normal and S2 normal. No murmur heard. No friction rub. No gallop.   Pulmonary:     Effort: Pulmonary effort is normal. No accessory muscle usage or respiratory distress.     Breath sounds: Normal breath sounds and air entry. No stridor or transmitted upper airway sounds. No decreased breath sounds, wheezing, rhonchi or rales.     Comments: No cough observed in exam room; spoke full sentences without difficulty; wearing cloth mask due to covid 19 pandemic Chest:     Chest wall: No tenderness.  Abdominal:     General: Abdomen is flat. There is no distension.     Palpations: Abdomen is soft.  Musculoskeletal:        General: No swelling, tenderness or  deformity. Normal range of motion.     Right shoulder: Normal. No swelling, deformity, effusion or laceration. Normal range of motion.     Left shoulder: Normal. No swelling, deformity, effusion or laceration. Normal range of motion.     Right elbow: No swelling, deformity, effusion or lacerations. Normal range of motion.     Left elbow: No swelling, deformity, effusion or lacerations. Normal range of motion.     Right hand: Normal. No swelling, deformity or lacerations. Normal range of motion.     Left hand: Normal. No swelling, deformity or lacerations. Normal range of motion.     Cervical back: Normal range of motion and neck supple. No edema, erythema, signs of trauma, rigidity, tenderness or crepitus. No pain with movement, spinous process tenderness or muscular tenderness. Normal range of motion.     Right hip: Normal.     Left hip: Normal.     Right knee: Normal.     Left knee:  Normal.     Right lower leg: No edema.     Left lower leg: No edema.  Lymphadenopathy:     Head:     Right side of head: No submental, submandibular, tonsillar, preauricular, posterior auricular or occipital adenopathy.     Left side of head: No submental, submandibular, tonsillar, preauricular, posterior auricular or occipital adenopathy.     Cervical: No cervical adenopathy.     Right cervical: No superficial, deep or posterior cervical adenopathy.    Left cervical: No superficial, deep or posterior cervical adenopathy.  Skin:    General: Skin is warm and dry.     Capillary Refill: Capillary refill takes less than 2 seconds.     Coloration: Skin is not ashen, cyanotic, jaundiced, mottled, pale or sallow.     Findings: No abrasion, abscess, acne, bruising, burn, ecchymosis, erythema, signs of injury, laceration, lesion, petechiae, rash or wound.     Nails: There is no clubbing.  Neurological:     General: No focal deficit present.     Mental Status: She is alert and oriented to person, place, and time.  Mental status is at baseline. She is not disoriented.     GCS: GCS eye subscore is 4. GCS verbal subscore is 5. GCS motor subscore is 6.     Cranial Nerves: Cranial nerves are intact. No cranial nerve deficit, dysarthria or facial asymmetry.     Sensory: Sensation is intact. No sensory deficit.     Motor: Motor function is intact. No weakness, tremor, atrophy, abnormal muscle tone or seizure activity.     Coordination: Coordination is intact. Coordination normal.     Gait: Gait is intact. Gait normal.     Comments: Gait sure and steady in clinic; in/out of chair and on/off exam table without difficulty; bilateral hand grasp equal 5/5  Psychiatric:        Attention and Perception: Attention and perception normal.        Mood and Affect: Mood and affect normal. Mood is not anxious or depressed.        Speech: Speech normal.        Behavior: Behavior normal. Behavior is not agitated. Behavior is cooperative.        Thought Content: Thought content normal.        Cognition and Memory: Cognition and memory normal. Cognition is not impaired. Memory is not impaired.        Judgment: Judgment normal.           Assessment & Plan:  A-acute rhinosinusitis, elevated blood pressure without hypertension diagnosis  P-post nasal drip and allergic shinersVSS T99.2 patient wearing short sleeve shirt over t-shirt in clinic today.  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  probable sinus headache related to seasonal allergy flare hasn't started allegra 180mg  po daily yet.  Patient refuses nose sprays.  Restart allegra 180mg  po daily.  Tylenol 1000mg  po q6h prn pain.  Shower after work or being outside.  Do not sleep with windows open.  Wear mask when around others.  Consider covid test if no improvement with restarting allegra.  Discussed flu and other viral illnesses are also circulating in community e.g. gastroenteritis and URIs.  If feeling worse today I recommend going home to rest,  hydrate and contact clinic staff to notify us feeling worse and discuss current symptoms/plan of care. OTC antihistamine of choice.  Avoid triggers if possible.  Shower prior to bedtime if exposed to  triggers.  If allergic dust/dust mites recommend mattress/pillow covers/encasements; washing linens, vacuuming, sweeping, dusting weekly.  Call or return to clinic as needed if these symptoms worsen or fail to improve as anticipated.   Exitcare handout on nonallergic rhinitis, sinus headache, allergic rhinitis printed and given to patient.  Patient verbalized understanding of instructions, agreed with plan of care and had no further questions at this time.  P2:  Avoidance and hand washing.  Follow up in 2 weeks repeat BP with RN Hildred Alamin.  I recommend weight loss, exercise 150 minutes per week.

## 2021-01-22 NOTE — Patient Instructions (Addendum)
Please go home if feeling worse and start isolation Start allegra 180mg  by mouth daily as spring bloom in full now Shower after work/being outside/do not sleep with windows open Call RN Hildred Alamin [phone] if went home or feeling worse Hydrate with water   Sinus Headache  A sinus headache occurs when your sinuses become clogged or swollen. Sinuses are air-filled spaces in your skull that are behind the bones of your face and forehead. Sinus headaches can range from mild to severe. What are the causes? A sinus headache can result from various conditions that affect the sinuses. Common causes include:  Colds.  Sinus infections.  Allergies. Many people confuse sinus headaches with migraines or tension headaches because both headaches can cause facial pain and nasal symptoms. What are the signs or symptoms? The main symptom of this condition is a headache that may feel like pain or pressure in your face, forehead, ears, or upper teeth. People who have a sinus headache often have other symptoms, such as:  Congested or runny nose.  Fever.  Inability to smell. Weather changes can make symptoms worse. How is this diagnosed? This condition may be diagnosed based on:  A physical exam and medical history.  Imaging tests, such as a CT scan or MRI, to check for problems with the sinuses.  Examination of the sinuses using a thin tool with a camera that is inserted through your nose (endoscopy). How is this treated? Treatment for this condition depends on the cause.  Sinus pain that is caused by a sinus infection may be treated with antibiotic medicine.  Sinus pain that is caused by congestion may be helped by rinsing out (flushing) the nose and sinuses with saline solution.  Sinus pain that is caused by allergies may be helped by allergy medicines (antihistamines) and medicated nasal sprays.  Sinus surgery may be needed in some cases if other treatments do not help. Follow these instructions  at home: General instructions  If directed: ? Apply a warm, moist washcloth to your face to help relieve pain. ? Use a nasal saline wash. Hydrate and humidify  Drink enough water to keep your urine clear or pale yellow. Staying hydrated will help to thin your mucus.  Use a cool mist humidifier to keep the humidity level in your home above 50%.  Inhale steam for 10-15 minutes, 3-4 times a day or as told by your health care provider. You can do this in the bathroom while a hot shower is running.  Limit your exposure to cool or dry air. Medicines  Take over-the-counter and prescription medicines only as told by your health care provider.  If you were prescribed an antibiotic medicine, take it as told by your health care provider. Do not stop taking the antibiotic even if you start to feel better.  If you have congestion, use a nasal spray to help lessen pressure.   Contact a health care provider if:  You have a headache more than one time a week.  You have sensitivity to light or sound.  You develop a fever.  You feel nauseous or you vomit.  Your headaches do not get better with treatment. Many people think that they have a sinus headache when they actually have a migraine or a tension headache. Get help right away if:  You have vision problems.  You have sudden, severe pain in your face or head.  You have a seizure.  You are confused.  You have a stiff neck. Summary  A  sinus headache occurs when your sinuses become clogged or swollen.  A sinus headache can result from various conditions that affect the sinuses, such as a cold, a sinus infection, or an allergy.  Treatment for this condition depends on the cause. It may include medicine, such as antibiotics or antihistamines. This information is not intended to replace advice given to you by your health care provider. Make sure you discuss any questions you have with your health care provider. Document Revised:  08/12/2020 Document Reviewed: 08/12/2020 Elsevier Patient Education  2021 [place].  Nonallergic Rhinitis Nonallergic rhinitis is inflammation of the mucous membrane inside the nose. The mucous membrane is the tissue that produces mucus. This condition is different from having allergic rhinitis, which is an allergy that affects the nose. Allergic rhinitis occurs when the body's defense system, or immune system, reacts to a substance that a person is allergic to (allergen), such as pollen, pet dander, mold, or dust. Nonallergic rhinitis has many similar symptoms, but it is not caused by allergens. Nonallergic rhinitis can be an acute or chronic problem. This means it can be short-term or long-term. What are the causes? This condition may be caused by many different things. Some common types of nonallergic rhinitis include:  Infectious rhinitis. This is usually caused by an infection in the nose, throat, or upper airways (upper respiratory system).  Vasomotor rhinitis. This is the most common type of chronic nonallergic rhinitis. It is caused by too much blood flow through your nose, and it leads to swelling in your nose. It is triggered by strong odors, cold air, stress, drinking alcohol, cigarette smoke, or changes in the weather.  Occupational rhinitis. This type is caused by triggers in the workplace, such as chemicals, dust, animal dander, or air pollution.  Hormonal rhinitis, in teenage girls and women. This type is caused by an increase in the hormone estrogen and may happen during pregnancy, puberty, or monthly menstrual periods. Hormonal rhinitis gives you fewer symptoms when estrogen levels drop.  Drug-induced rhinitis. Several types of medicines can cause this, such as medicines for high blood pressure or heart disease, aspirin, or NSAIDs.  Nonallergic rhinitis with eosinophilia syndrome (NARES). This type is caused by having too much eosinophil, a type of white blood cell. Other  causes include a reaction to eating hot or spicy foods. This does not usually cause long-term symptoms. In some cases, the cause of nonallergic rhinitis is not known. What increases the risk? You are more likely to develop this condition if:  You are 49-80 years of age.  You are a woman. Women are twice as likely to have this condition. What are the signs or symptoms? Common symptoms of this condition include:  Stuffy nose (nasal congestion).  Runny nose.  A feeling of mucus dripping down the back of your throat (postnasal drip).  Trouble sleeping.  Tiredness, or fatigue. Other symptoms include:  Sneezing.  Coughing.  Itchy nose.  Bloodshot eyes. How is this diagnosed? This type may be diagnosed based on:  Your symptoms and medical history.  A physical exam.  Allergy testing to rule out allergic rhinitis. You may have skin tests or blood tests. Your health care provider may also take a swab of nasal discharge to look for an increased number of eosinophils. This would be done to confirm a diagnosis of NARES. How is this treated? Treatment for this condition depends on the cause. No single treatment works for everyone. Work with your health care provider to find the  best treatment for you. Treatment may include:  Avoiding the things that trigger your symptoms.  Medicines to relieve congestion, such as: ? Steroid nasal spray. There are many types. You may need to try a few to find out which one works best. ? Decongestant medicine. This treats nasal congestion and may be given by mouth or as a nasal spray. These medicines are used only for a short time.  Medicines to relieve a runny nose. These may include antihistamine medicines or anticholinergic nasal sprays.  Nasal irrigation. This involves using a salt-water (saline) spray or saline container called a neti pot. Nasal irrigation helps to clear away mucus and keep your nasal passages moist.  Surgery to remove part of  your mucous membrane. This is done in severe cases if the condition has not improved after 6-12 months of treatment.   Follow these instructions at home: Medicines  Take or use over-the-counter and prescription medicines only as told by your health care provider. Do not stop using your medicine even if you start to feel better.  Do not take NSAIDs, such as ibuprofen, or medicines that contain aspirin if they make your symptoms worse. Lifestyle  Do not drink alcohol if it makes your symptoms worse.  Do not use any products that contain nicotine or tobacco, such as cigarettes, e-cigarettes, and chewing tobacco. If you need help quitting, ask your health care provider.  Avoid secondhand smoke. General instructions  Avoid triggers that make your symptoms worse.  Use nasal irrigation as told by your health care provider.  Get exercise. Exercise may help reduce symptoms for some people.  Sleep with the head of your bed raised. This may reduce nasal congestion when you sleep.  Drink enough fluid to keep your urine pale yellow.  Keep all follow-up visits as told by your health care provider. This is important. Contact a health care provider if:  You have a fever.  Your symptoms are getting worse at home.  Your symptoms do not lessen with medicine.  You develop new symptoms, especially a headache or nosebleed. Summary  Nonallergic rhinitis is inflammation inside the nose that is not caused by allergens. Nonallergic rhinitis can be a short-term or long-term problem.  Treatment may include avoiding the things that trigger your symptoms.  Take or use over-the-counter and prescription medicines only as told by your health care provider. Do not stop using your medicine even if you start to feel better.  Contact a health care provider if your symptoms do not lessen with medicine. This information is not intended to replace advice given to you by your health care provider. Make sure you  discuss any questions you have with your health care provider. Document Revised: 09/10/2019 Document Reviewed: 09/10/2019 Elsevier Patient Education  2021 [place]. Allergic Rhinitis, Adult  Allergic rhinitis is an allergic reaction that affects the mucous membrane inside the nose. The mucous membrane is the tissue that produces mucus. There are two types of allergic rhinitis:  Seasonal. This type is also called hay fever and happens only during certain seasons.  Perennial. This type can happen at any time of the year. Allergic rhinitis cannot be spread from person to person. This condition can be mild, moderate, or severe. It can develop at any age and may be outgrown. What are the causes? This condition is caused by allergens. These are things that can cause an allergic reaction. Allergens may differ for seasonal allergic rhinitis and perennial allergic rhinitis.  Seasonal allergic rhinitis is triggered  by pollen. Pollen can come from grasses, trees, and weeds.  Perennial allergic rhinitis may be triggered by: ? Dust mites. ? Proteins in a pet's urine, saliva, or dander. Dander is dead skin cells from a pet. ? Smoke, mold, or car fumes. What increases the risk? You are more likely to develop this condition if you have a family history of allergies or other conditions related to allergies, including:  Allergic conjunctivitis. This is inflammation of parts of the eyes and eyelids.  Asthma. This condition affects the lungs and makes it hard to breathe.  Atopic dermatitis or eczema. This is long term (chronic) inflammation of the skin.  Food allergies. What are the signs or symptoms? Symptoms of this condition include:  Sneezing or coughing.  A stuffy nose (nasal congestion), itchy nose, or nasal discharge.  Itchy eyes and tearing of the eyes.  A feeling of mucus dripping down the back of your throat (postnasal drip).  Trouble sleeping.  Tiredness or  fatigue.  Headache.  Sore throat. How is this diagnosed? This condition may be diagnosed with your symptoms, medical history, and physical exam. Your health care provider may check for related conditions, such as:  Asthma.  Pink eye. This is eye inflammation caused by infection (conjunctivitis).  Ear infection.  Upper respiratory infection. This is an infection in the nose, throat, or upper airways. You may also have tests to find out which allergens trigger your symptoms. These may include skin tests or blood tests. How is this treated? There is no cure for this condition, but treatment can help control symptoms. Treatment may include:  Taking medicines that block allergy symptoms, such as corticosteroids and antihistamines. Medicine may be given as a shot, nasal spray, or pill.  Avoiding any allergens.  Being exposed again and again to tiny amounts of allergens to help you build a defense against allergens (immunotherapy). This is done if other treatments have not helped. It may include: ? Allergy shots. These are injected medicines that have small amounts of allergen in them. ? Sublingual immunotherapy. This involves taking small doses of a medicine with allergen in it under your tongue. If these treatments do not work, your health care provider may prescribe newer, stronger medicines. Follow these instructions at home: Avoiding allergens Find out what you are allergic to and avoid those allergens. These are some things you can do to help avoid allergens:  If you have perennial allergies: ? Replace carpet with wood, tile, or vinyl flooring. Carpet can trap dander and dust. ? Do not smoke. Do not allow smoking in your home. ? Change your heating and air conditioning filters at least once a month.  If you have seasonal allergies, take these steps during allergy season: ? Keep windows closed as much as possible. ? Plan outdoor activities when pollen counts are lowest. Check  pollen counts before you plan outdoor activities. ? When coming indoors, change clothing and shower before sitting on furniture or bedding.  If you have a pet in the house that produces allergens: ? Keep the pet out of the bedroom. ? Vacuum, sweep, and dust regularly. General instructions  Take over-the-counter and prescription medicines only as told by your health care provider.  Drink enough fluid to keep your urine pale yellow.  Keep all follow-up visits as told by your health care provider. This is important. Where to find more information  American Academy of [employer], Asthma & Immunology: www.aaaai.org Contact a health care provider if:  You have a fever.  You develop a cough that does not go away.  You make whistling sounds when you breathe (wheeze).  Your symptoms slow you down or stop you from doing your normal activities each day. Get help right away if:  You have shortness of breath. This symptom may represent a serious problem that is an emergency. Do not wait to see if the symptom will go away. Get medical help right away. Call your local emergency services (911 in the [place] Do not drive yourself to the hospital. Summary  Allergic rhinitis may be managed by taking medicines as directed and avoiding allergens.  If you have seasonal allergies, keep windows closed as much as possible during allergy season.  Contact your health care provider if you develop a fever or a cough that does not go away. This information is not intended to replace advice given to you by your health care provider. Make sure you discuss any questions you have with your health care provider. Document Revised: 12/21/2019 Document Reviewed: 10/30/2019 Elsevier Patient Education  2021 [employer].

## 2021-02-19 DIAGNOSIS — N950 Postmenopausal bleeding: Secondary | ICD-10-CM

## 2021-02-19 HISTORY — DX: Postmenopausal bleeding: N95.0

## 2021-03-09 ENCOUNTER — Encounter: Payer: Self-pay | Admitting: Gynecologic Oncology

## 2021-03-09 ENCOUNTER — Telehealth: Payer: Self-pay | Admitting: *Deleted

## 2021-03-09 NOTE — Telephone Encounter (Signed)
Poke with the patient and scheduled a new patient appt for 4/28 at 1 pm with Dr Denman George; patient given an arrival time of 12:30 pm. Patient given the address and phone number for the clinic. Patient also given the policy for mask and visitors

## 2021-03-12 ENCOUNTER — Other Ambulatory Visit: Payer: Self-pay

## 2021-03-12 ENCOUNTER — Encounter (HOSPITAL_COMMUNITY): Payer: Self-pay | Admitting: Gynecologic Oncology

## 2021-03-12 ENCOUNTER — Other Ambulatory Visit: Payer: Self-pay | Admitting: Gynecologic Oncology

## 2021-03-12 ENCOUNTER — Encounter: Payer: Self-pay | Admitting: Gynecologic Oncology

## 2021-03-12 ENCOUNTER — Inpatient Hospital Stay: Payer: PRIVATE HEALTH INSURANCE | Attending: Gynecologic Oncology | Admitting: Gynecologic Oncology

## 2021-03-12 VITALS — BP 141/83 | HR 107 | Temp 97.6°F | Resp 20 | Ht 67.32 in | Wt 277.3 lb

## 2021-03-12 DIAGNOSIS — Z87891 Personal history of nicotine dependence: Secondary | ICD-10-CM | POA: Insufficient documentation

## 2021-03-12 DIAGNOSIS — C541 Malignant neoplasm of endometrium: Secondary | ICD-10-CM

## 2021-03-12 MED ORDER — TRAMADOL HCL 50 MG PO TABS: 50.0000 mg | ORAL_TABLET | Freq: Four times a day (QID) | ORAL | 0 refills | Status: DC | PRN

## 2021-03-12 MED ORDER — SENNOSIDES-DOCUSATE SODIUM 8.6-50 MG PO TABS: 2.0000 | ORAL_TABLET | Freq: Every day | ORAL | 0 refills | Status: DC

## 2021-03-12 MED ORDER — IBUPROFEN 800 MG PO TABS: 800.0000 mg | ORAL_TABLET | Freq: Three times a day (TID) | ORAL | 0 refills | Status: DC | PRN

## 2021-03-12 NOTE — Patient Instructions (Signed)
Preparing for your Surgery  Plan for surgery on Mar 17, 2021 with Dr. Everitt Amber at [facility] will be scheduled for a robotic assisted total laparoscopic hysterectomy (removal of the uterus and cervix), bilateral salpingo-oophorectomy (removal of both ovaries and fallopian tubes), sentinel lymph node biopsy, possible lymph node dissection.   Pre-operative Testing -You will receive a phone call from presurgical testing at [facility] to arrange for a pre-operative appointment, labs, and COVID test. The COVID test normally happens 3 days prior to the surgery and they ask that you self quarantine after the test up until surgery to decrease chance of exposure.  -Bring your insurance card, copy of an advanced directive if applicable, medication list  -At that visit, you will be asked to sign a consent for a possible blood transfusion in case a transfusion becomes necessary during surgery.  The need for a blood transfusion is rare but having consent is a necessary part of your care.     -You should not be taking blood thinners or aspirin at least ten days prior to surgery unless instructed by your surgeon.  -Do not take supplements such as fish oil (omega 3), red yeast rice, turmeric before your surgery. You want to avoid medications with aspirin in them including headache powders such as BC or Goody's), Excedrin migraine.  Day Before Surgery at [place] will be asked to take in a light diet the day before surgery. You will be advised you can have clear liquids up until 3 hours before your surgery.    Eat a light diet the day before surgery.  Examples including soups, broths, toast, yogurt, mashed potatoes.  AVOID GAS PRODUCING FOODS. Things to avoid include carbonated beverages (fizzy beverages, sodas), raw fruits and raw vegetables (uncooked), or beans.   If your bowels are filled with gas, your surgeon will have difficulty visualizing your pelvic organs which increases your  surgical risks.  Your role in recovery Your role is to become active as soon as directed by your doctor, while still giving yourself time to heal.  Rest when you feel tired. You will be asked to do the following in order to speed your recovery:  - Cough and breathe deeply. This helps to clear and expand your lungs and can prevent pneumonia after surgery.  - [place]. Do mild physical activity. Walking or moving your legs help your circulation and body functions return to normal. Do not try to get up or walk alone the first time after surgery.   -If you develop swelling on one leg or the other, pain in the back of your leg, redness/warmth in one of your legs, please call the office or go to the Emergency Room to have a doppler to rule out a blood clot. For shortness of breath, chest pain-seek care in the Emergency Room as soon as possible. - Actively manage your pain. Managing your pain lets you move in comfort. We will ask you to rate your pain on a scale of zero to 10. It is your responsibility to tell your doctor or nurse where and how much you hurt so your pain can be treated.  Special Considerations -If you are diabetic, you may be placed on insulin after surgery to have closer control over your blood sugars to promote healing and recovery.  This does not mean that you will be discharged on insulin.  If applicable, your oral antidiabetics will be resumed when you are tolerating  a solid diet.  -Your final pathology results from surgery should be available around one week after surgery and the results will be relayed to you when available.  -Dr. Lahoma Crocker is the surgeon that assists your GYN Oncologist with surgery.  If you end up staying the night, the next day after your surgery you will either see Dr. Denman George, Dr. Berline Lopes, or Dr. Lahoma Crocker.  -FMLA forms can be faxed to [phone] and please allow 5-7 business days for completion.  Pain Management After  Surgery -You have been prescribed your pain medication and bowel regimen medications before surgery so that you can have these available when you are discharged from the hospital. The pain medication is for use ONLY AFTER surgery and a new prescription will not be given.   -Make sure that you have Tylenol and Ibuprofen at home to use on a regular basis after surgery for pain control. We recommend alternating the medications every hour to six hours since they work differently and are processed in the body differently for pain relief.  -Review the attached handout on narcotic use and their risks and side effects.   Bowel Regimen -You have been prescribed Sennakot-S to take nightly to prevent constipation especially if you are taking the narcotic pain medication intermittently.  It is important to prevent constipation and drink adequate amounts of liquids. You can stop taking this medication when you are not taking pain medication and you are back on your normal bowel routine.  Risks of Surgery Risks of surgery are low but include bleeding, infection, damage to surrounding structures, re-operation, blood clots, and very rarely death.   Blood Transfusion Information (For the consent to be signed before surgery)  We will be checking your blood type before surgery so in case of emergencies, we will know what type of blood you would need.                                            WHAT IS A BLOOD TRANSFUSION?  A transfusion is the replacement of blood or some of its parts. Blood is made up of multiple cells which provide different functions.  Red blood cells carry oxygen and are used for blood loss replacement.  White blood cells fight against infection.  Platelets control bleeding.  Plasma helps clot blood.  Other blood products are available for specialized needs, such as hemophilia or other clotting disorders. BEFORE THE TRANSFUSION  Who gives blood for transfusions?   You may be able  to donate blood to be used at a later date on yourself (autologous donation).  Relatives can be asked to donate blood. This is generally not any safer than if you have received blood from a stranger. The same precautions are taken to ensure safety when a relative's blood is donated.  Healthy volunteers who are fully evaluated to make sure their blood is safe. This is blood bank blood. Transfusion therapy is the safest it has ever been in the practice of medicine. Before blood is taken from a donor, a complete history is taken to make sure that person has no history of diseases nor engages in risky social behavior (examples are intravenous drug use or sexual activity with multiple partners). The donor's travel history is screened to minimize risk of transmitting infections, such as malaria. The donated blood is tested for signs of infectious diseases, such as  HIV and hepatitis. The blood is then tested to be sure it is compatible with you in order to minimize the chance of a transfusion reaction. If you or a relative donates blood, this is often done in anticipation of surgery and is not appropriate for emergency situations. It takes many days to process the donated blood. RISKS AND COMPLICATIONS Although transfusion therapy is very safe and saves many lives, the main dangers of transfusion include:   Getting an infectious disease.  Developing a transfusion reaction. This is an allergic reaction to something in the blood you were given. Every precaution is taken to prevent this. The decision to have a blood transfusion has been considered carefully by your caregiver before blood is given. Blood is not given unless the benefits outweigh the risks.  AFTER SURGERY INSTRUCTIONS  Return to work: 4 weeks if applicable  Activity: 1. Be up and out of the bed during the day.  Take a nap if needed.  You may walk up steps but be careful and use the hand rail.  Stair climbing will tire you more than you think,  you may need to stop part way and rest.   2. No lifting or straining for 6 weeks over 10 pounds. No pushing, pulling, straining for 6 weeks.  3. No driving for 1 week(s).  Do not drive if you are taking narcotic pain medicine and make sure that your reaction time has returned.   4. You can shower as soon as the next day after surgery. Shower daily.  Use your regular soap and water (not directly on the incision) and pat your incision(s) dry afterwards; don't rub.  No tub baths or submerging your body in water until cleared by your surgeon. If you have the soap that was given to you by pre-surgical testing that was used before surgery, you do not need to use it afterwards because this can irritate your incisions.   5. No sexual activity and nothing in the vagina for 8 weeks.  6. You may experience a small amount of clear drainage from your incisions, which is normal.  If the drainage persists, increases, or changes color please call the office.  7. Do not use creams, lotions, or ointments such as neosporin on your incisions after surgery until advised by your surgeon because they can cause removal of the dermabond glue on your incisions.    8. You may experience vaginal spotting after surgery or around the 6-8 week mark from surgery when the stitches at the top of the vagina begin to dissolve.  The spotting is normal but if you experience heavy bleeding, call our office.  9. Take Tylenol or ibuprofen first for pain and only use narcotic pain medication for severe pain not relieved by the Tylenol or Ibuprofen.  Monitor your Tylenol intake to a max of 4,000 mg in a 24 hour period. You can alternate these medications after surgery.  Diet: 1. Low sodium Heart Healthy Diet is recommended but you are cleared to resume your normal (before surgery) diet after your procedure.  2. It is safe to use a laxative, such as Miralax or Colace, if you have difficulty moving your bowels. You have been prescribed  Sennakot at bedtime every evening to keep bowel movements regular and to prevent constipation.    Wound Care: 1. Keep clean and dry.  Shower daily.  Reasons to call the Doctor:  Fever - Oral temperature greater than 100.4 degrees Fahrenheit  Foul-smelling vaginal discharge  Difficulty  urinating  Nausea and vomiting  Increased pain at the site of the incision that is unrelieved with pain medicine.  Difficulty breathing with or without chest pain  New calf pain especially if only on one side  Sudden, continuing increased vaginal bleeding with or without clots.   Contacts: For questions or concerns you should contact:  Dr. Everitt Amber at [phone]  Joylene John, NP at [phone]  After Hours: call [phone] and have the GYN Oncologist paged/contacted (after 5 pm or on the weekends).  Messages sent via mychart are for non-urgent matters and are not responded to after hours so for urgent needs, please call the after hours number.

## 2021-03-12 NOTE — Progress Notes (Signed)
COVID Vaccine Completed:Yes Date COVID Vaccine completed: 03/12/2020 Has received booster: Yes COVID vaccine manufacturer: Pfizer    Date of COVID positive in last 90 days: No  PCP - Loraine Leriche, MD Cardiologist - N/A  Chest x-ray - N/A EKG - N/A Stress Test - N/A ECHO - N/A Cardiac Cath - N/A Pacemaker/ICD device last checked: N/A  Sleep Study - N/A CPAP - N/A  Fasting Blood Sugar - N/A Checks Blood Sugar _N/A____ times a day  Blood Thinner Instructions: N/A Aspirin Instructions: N/A Last Dose: N/A  Activity level:  Can go up a flight of stairs and activities of daily living without stopping and without symptoms    Anesthesia review: N/A  Patient denies shortness of breath, fever, cough and chest pain at PAT appointment   Patient verbalized understanding of instructions that were given to them at the PAT appointment. Patient was also instructed that they will need to review over the PAT instructions again at home before surgery.

## 2021-03-12 NOTE — Progress Notes (Signed)
Consult Note: Gyn-Onc  Consult was requested by Dr. Pamala Cook for the evaluation of Sara Cook 55 y.o. female  CC:  Chief Complaint  Patient presents with  . Endometrial cancer [facility])    Assessment/Plan:  [name]  is a 55 y.o.  year old P2 with grade 1 endometrial cancer.  A detailed discussion was held with the patient with regard to to her endometrial cancer diagnosis. We discussed the standard management options for uterine cancer which includes surgery followed possibly by adjuvant therapy depending on the results of surgery. The surgical management include a robotic assisted total hysterectomy and removal of the tubes and ovaries with sampling of lymph nodes. If a minimally invasive approach is not feasible, a laparotomy may be necessary (including for specimen delivery). The patient has been counseled about these surgical options and the risks of surgery in general including infection, bleeding, damage to surrounding structures (including bowel, bladder, ureters, nerves or vessels), and the postoperative risks of PE/ DVT, and lymphedema. After counseling and consideration of her options, she is in agreement to proceed with robotic assisted total hysterectomy with bilateral sapingo-oophorectomy and SLN biopsy.   She will be seen by anesthesia for preoperative clearance and discussion of postoperative pain management.  She was given the opportunity to ask questions, which were answered to her satisfaction, and she is agreement with the above mentioned plan of care.   We explained that robotic hysterectomy is typically an outpatient procedure with same day discharge provided that she is meeting appropriate discharge criteria from the PACU. We provided extensive counseling regarding post-operative expectations for recovery and restrictions/limitations. We provided information regarding multi-modal pain therapy and the importance of avoidance of opioids. I informed the patient that  I anticipated a 4 week recovery time off of work.  We explained that after surgery we will review her definitive pathology and determine if adjuvant therapy is recommended.   HPI: [name] is a 55 year old P2 who was seen in consultation at the request of Dr Sara Cook for evaluation and treatment of grade 1 endometrial cancer.   Her symptoms began in the early part of the year of 2021 with postmenopausal spotting, She mentioned this to her PCP during her annual wellness visit and her PCP let her know that she would facilitate a referral to a gynecologist. The patient stated that referral was delayed due to difficulties scheduling due to the [place] pandemic.  She saw Dr Sara Cook for a new patient gyn visit for this symptom on 02/24/21.  Work-up of symptoms included a pap smear and endometrial biopsy. Transvaginal US was not performed. Endometrial sampling with an endometrial pipelle was performed on 02/24/21 and showed a FIGO grade 1 endometrioid endometrial cancer.   Pap smear at that visit was normal and negative for high risk HPV.  The patient has a medical history that is significant for morbid obesity with a BMI of 40 kg per metered square.    Her gynecologic history is significant for 2 prior SVD's.  The patient has had no prior abdominal surgery.  The patient's medical history is unremarkable for family members with malignancies.  The patient works for replacements on limited.  She lives with her husband.  Current Meds:  Outpatient Encounter Medications as of 03/12/2021  Medication Sig  . acetaminophen (TYLENOL) 500 MG tablet Take 500-1,000 mg by mouth every 6 (six) hours as needed for moderate pain.  [name] ibuprofen (ADVIL) 800 MG tablet Take 1 tablet (800 mg total) by  mouth every 8 (eight) hours as needed for moderate pain. For AFTER surgery only  . Multiple Vitamin (MULTIVITAMIN) capsule Take 1 capsule by mouth daily.  [name] senna-docusate (SENOKOT-S) 8.6-50 MG tablet Take 2  tablets by mouth at bedtime. For AFTER surgery, do not take if having diarrhea  . traMADol (ULTRAM) 50 MG tablet Take 1 tablet (50 mg total) by mouth every 6 (six) hours as needed for severe pain. For AFTER surgery, do not take and drive  . triamcinolone cream (KENALOG) 0.5 % Apply thin layer twice a day to affected areas as needed, not to face  . loratadine (CLARITIN) 10 MG tablet Take 1 tablet (10 mg total) by mouth daily. (Patient not taking: Reported on 03/09/2021)  . [DISCONTINUED] albuterol (VENTOLIN HFA) 108 (90 Base) MCG/ACT inhaler Inhale 1-2 puffs into the lungs every 6 (six) hours as needed for wheezing or shortness of breath. (Patient not taking: Reported on 01/22/2021)  . [DISCONTINUED] aspirin EC 81 MG tablet Take 81 mg by mouth. (Patient not taking: Reported on 01/22/2021)   No facility-administered encounter medications on file as of 03/12/2021.    Allergy: No Known Allergies  Social Hx:   Social History   Socioeconomic History  . Marital status: Married    Spouse name: Not on file  . Number of children: Not on file  . Years of education: Not on file  . Highest education level: Not on file  Occupational History  . Not on file  Tobacco Use  . Smoking status: Former Smoker    Packs/day: 1.00    Years: 30.00    Pack years: 30.00    Types: Cigarettes    Quit date: 02/07/1999    Years since quitting: 22.1  . Smokeless tobacco: Never Used  Vaping Use  . Vaping Use: Never used  Substance and Sexual Activity  . Alcohol use: Yes    Comment: occasionally  . Drug use: Never  . Sexual activity: Not Currently    Birth control/protection: Post-menopausal  Other Topics Concern  . Not on file  Social History Narrative  . Not on file   Social Determinants of Health   Financial Resource Strain: Not on file  Food Insecurity: Not on file  Transportation Needs: Not on file  Physical Activity: Not on file  Stress: Not on file  Social Connections: Not on file  Intimate  Partner Violence: Not on file    Past Surgical Hx: History reviewed. No pertinent surgical history.  Past Medical Hx:  Past Medical History:  Diagnosis Date  . PMB (postmenopausal bleeding) 02/19/2021    Past Gynecological History: SVD x2 No LMP recorded. Patient is postmenopausal.  Family Hx:  Family History  Problem Relation Age of Onset  . Breast cancer Neg Hx   . Colon cancer Neg Hx   . Ovarian cancer Neg Hx   . Endometrial cancer Neg Hx   . Pancreatic cancer Neg Hx   . Prostate cancer Neg Hx     Review of Systems:  Constitutional  Feels well,    ENT Normal appearing ears and nares bilaterally Skin/Breast  No rash, sores, jaundice, itching, dryness Cardiovascular  No chest pain, shortness of breath, or edema  Pulmonary  No cough or wheeze.  Gastro Intestinal  No nausea, vomitting, or diarrhoea. No bright red blood per rectum, no abdominal pain, change in bowel movement, or constipation.  Genito Urinary  No frequency, urgency, dysuria, + postmenopausal bleeding Musculo Skeletal  No myalgia, arthralgia, joint swelling or pain  Neurologic  No weakness, numbness, change in gait,  Psychology  No depression, anxiety, insomnia.   Vitals:  Blood pressure (!) 141/83, pulse (!) 107, temperature 97.6 F (36.4 C), temperature source Tympanic, resp. rate 20, height 5' 7.32" (1.71 m), weight 277 lb 4.8 oz (125.8 kg), SpO2 99 %.  Physical Exam: WD in NAD Neck  Supple NROM, without any enlargements.  Lymph Node Survey No cervical supraclavicular or inguinal adenopathy Cardiovascular  Pulse normal rate, regularity and rhythm. S1 and S2 normal.  Lungs  Clear to auscultation bilateraly, without wheezes/crackles/rhonchi. Good air movement.  Skin  No rash/lesions/breakdown  Psychiatry  Alert and oriented to person, place, and time  Abdomen  Normoactive bowel sounds, abdomen soft, non-tender and obese without evidence of hernia.  Back No CVA tenderness Genito Urinary   Vulva/vagina: Normal external female genitalia.   No lesions. No discharge or bleeding.  Bladder/urethra:  No lesions or masses, well supported bladder  Vagina: normal  Cervix: Normal appearing, no lesions.  Uterus:  Difficult to appreciate size due to body habitus. mobile, no parametrial involvement or nodularity.  Adnexa: no palpable masses. Rectal  deferred Extremities  No bilateral cyanosis, clubbing or edema.  60 minutes of total time was spent for this patient encounter, including preparation, face-to-face counseling with the patient and coordination of care, and documentation of the encounter.   [name], MD  03/12/2021, 2:29 PM

## 2021-03-12 NOTE — H&P (View-Only) (Signed)
Consult Note: Gyn-Onc  Consult was requested by Dr. Pamala Cook for the evaluation of Sara Cook 55 y.o. female  CC:  Chief Complaint  Patient presents with  . Endometrial cancer [facility])    Assessment/Plan:  [name]  is a 55 y.o.  year old P2 with grade 1 endometrial cancer.  A detailed discussion was held with the patient with regard to to her endometrial cancer diagnosis. We discussed the standard management options for uterine cancer which includes surgery followed possibly by adjuvant therapy depending on the results of surgery. The surgical management include a robotic assisted total hysterectomy and removal of the tubes and ovaries with sampling of lymph nodes. If a minimally invasive approach is not feasible, a laparotomy may be necessary (including for specimen delivery). The patient has been counseled about these surgical options and the risks of surgery in general including infection, bleeding, damage to surrounding structures (including bowel, bladder, ureters, nerves or vessels), and the postoperative risks of PE/ DVT, and lymphedema. After counseling and consideration of her options, she is in agreement to proceed with robotic assisted total hysterectomy with bilateral sapingo-oophorectomy and SLN biopsy.   She will be seen by anesthesia for preoperative clearance and discussion of postoperative pain management.  She was given the opportunity to ask questions, which were answered to her satisfaction, and she is agreement with the above mentioned plan of care.   We explained that robotic hysterectomy is typically an outpatient procedure with same day discharge provided that she is meeting appropriate discharge criteria from the PACU. We provided extensive counseling regarding post-operative expectations for recovery and restrictions/limitations. We provided information regarding multi-modal pain therapy and the importance of avoidance of opioids. I informed the patient that  I anticipated a 4 week recovery time off of work.  We explained that after surgery we will review her definitive pathology and determine if adjuvant therapy is recommended.   HPI: [name] is a 55 year old P2 who was seen in consultation at the request of Dr Sara Cook for evaluation and treatment of grade 1 endometrial cancer.   Her symptoms began in the early part of the year of 2021 with postmenopausal spotting, She mentioned this to her PCP during her annual wellness visit and her PCP let her know that she would facilitate a referral to a gynecologist. The patient stated that referral was delayed due to difficulties scheduling due to the [place] pandemic.  She saw Dr Sara Cook for a new patient gyn visit for this symptom on 02/24/21.  Work-up of symptoms included a pap smear and endometrial biopsy. Transvaginal US was not performed. Endometrial sampling with an endometrial pipelle was performed on 02/24/21 and showed a FIGO grade 1 endometrioid endometrial cancer.   Pap smear at that visit was normal and negative for high risk HPV.  The patient has a medical history that is significant for morbid obesity with a BMI of 40 kg per metered square.    Her gynecologic history is significant for 2 prior SVD's.  The patient has had no prior abdominal surgery.  The patient's medical history is unremarkable for family members with malignancies.  The patient works for replacements on limited.  She lives with her husband.  Current Meds:  Outpatient Encounter Medications as of 03/12/2021  Medication Sig  . acetaminophen (TYLENOL) 500 MG tablet Take 500-1,000 mg by mouth every 6 (six) hours as needed for moderate pain.  [name] ibuprofen (ADVIL) 800 MG tablet Take 1 tablet (800 mg total) by  mouth every 8 (eight) hours as needed for moderate pain. For AFTER surgery only  . Multiple Vitamin (MULTIVITAMIN) capsule Take 1 capsule by mouth daily.  . senna-docusate (SENOKOT-S) 8.6-50 MG tablet Take 2  tablets by mouth at bedtime. For AFTER surgery, do not take if having diarrhea  . traMADol (ULTRAM) 50 MG tablet Take 1 tablet (50 mg total) by mouth every 6 (six) hours as needed for severe pain. For AFTER surgery, do not take and drive  . triamcinolone cream (KENALOG) 0.5 % Apply thin layer twice a day to affected areas as needed, not to face  . loratadine (CLARITIN) 10 MG tablet Take 1 tablet (10 mg total) by mouth daily. (Patient not taking: Reported on 03/09/2021)  . [DISCONTINUED] albuterol (VENTOLIN HFA) 108 (90 Base) MCG/ACT inhaler Inhale 1-2 puffs into the lungs every 6 (six) hours as needed for wheezing or shortness of breath. (Patient not taking: Reported on 01/22/2021)  . [DISCONTINUED] aspirin EC 81 MG tablet Take 81 mg by mouth. (Patient not taking: Reported on 01/22/2021)   No facility-administered encounter medications on file as of 03/12/2021.    Allergy: No Known Allergies  Social Hx:   Social History   Socioeconomic History  . Marital status: Married    Spouse name: Not on file  . Number of children: Not on file  . Years of education: Not on file  . Highest education level: Not on file  Occupational History  . Not on file  Tobacco Use  . Smoking status: Former Smoker    Packs/day: 1.00    Years: 30.00    Pack years: 30.00    Types: Cigarettes    Quit date: 02/07/1999    Years since quitting: 22.1  . Smokeless tobacco: Never Used  Vaping Use  . Vaping Use: Never used  Substance and Sexual Activity  . Alcohol use: Yes    Comment: occasionally  . Drug use: Never  . Sexual activity: Not Currently    Birth control/protection: Post-menopausal  Other Topics Concern  . Not on file  Social History Narrative  . Not on file   Social Determinants of Health   Financial Resource Strain: Not on file  Food Insecurity: Not on file  Transportation Needs: Not on file  Physical Activity: Not on file  Stress: Not on file  Social Connections: Not on file  Intimate  Partner Violence: Not on file    Past Surgical Hx: History reviewed. No pertinent surgical history.  Past Medical Hx:  Past Medical History:  Diagnosis Date  . PMB (postmenopausal bleeding) 02/19/2021    Past Gynecological History: SVD x2 No LMP recorded. Patient is postmenopausal.  Family Hx:  Family History  Problem Relation Age of Onset  . Breast cancer Neg Hx   . Colon cancer Neg Hx   . Ovarian cancer Neg Hx   . Endometrial cancer Neg Hx   . Pancreatic cancer Neg Hx   . Prostate cancer Neg Hx     Review of Systems:  Constitutional  Feels well,    ENT Normal appearing ears and nares bilaterally Skin/Breast  No rash, sores, jaundice, itching, dryness Cardiovascular  No chest pain, shortness of breath, or edema  Pulmonary  No cough or wheeze.  Gastro Intestinal  No nausea, vomitting, or diarrhoea. No bright red blood per rectum, no abdominal pain, change in bowel movement, or constipation.  Genito Urinary  No frequency, urgency, dysuria, + postmenopausal bleeding Musculo Skeletal  No myalgia, arthralgia, joint swelling or pain    Neurologic  No weakness, numbness, change in gait,  Psychology  No depression, anxiety, insomnia.   Vitals:  Blood pressure (!) 141/83, pulse (!) 107, temperature 97.6 F (36.4 C), temperature source Tympanic, resp. rate 20, height 5' 7.32" (1.71 m), weight 277 lb 4.8 oz (125.8 kg), SpO2 99 %.  Physical Exam: WD in NAD Neck  Supple NROM, without any enlargements.  Lymph Node Survey No cervical supraclavicular or inguinal adenopathy Cardiovascular  Pulse normal rate, regularity and rhythm. S1 and S2 normal.  Lungs  Clear to auscultation bilateraly, without wheezes/crackles/rhonchi. Good air movement.  Skin  No rash/lesions/breakdown  Psychiatry  Alert and oriented to person, place, and time  Abdomen  Normoactive bowel sounds, abdomen soft, non-tender and obese without evidence of hernia.  Back No CVA tenderness Genito Urinary   Vulva/vagina: Normal external female genitalia.   No lesions. No discharge or bleeding.  Bladder/urethra:  No lesions or masses, well supported bladder  Vagina: normal  Cervix: Normal appearing, no lesions.  Uterus:  Difficult to appreciate size due to body habitus. mobile, no parametrial involvement or nodularity.  Adnexa: no palpable masses. Rectal  deferred Extremities  No bilateral cyanosis, clubbing or edema.  60 minutes of total time was spent for this patient encounter, including preparation, face-to-face counseling with the patient and coordination of care, and documentation of the encounter.   Latonja Bobeck C Persis Graffius, MD  03/12/2021, 2:29 PM     

## 2021-03-12 NOTE — Patient Instructions (Addendum)
DUE TO COVID-19 ONLY ONE VISITOR IS ALLOWED TO COME WITH YOU AND STAY IN THE WAITING ROOM ONLY DURING PRE OP AND PROCEDURE.   **NO VISITORS ARE ALLOWED IN THE SHORT STAY AREA OR RECOVERY ROOM!!**    COVID SWAB TESTING MUST BE COMPLETED ON: Friday, March 13, 2021 at 11:20AM    [address] [place], [place] [address]  (Must self quarantine after testing. Follow instructions on handout.)       Your procedure is scheduled on: Tuesday, Mar 17, 2021   Report to [facility] Main  Entrance    Report to admitting at 8:30AM   Call this number if you have problems the morning of surgery [phone]   Light diet the day before (avoid gas producing foods)   Do not eat food :After Midnight.   May have liquids until  7:30AM  day of surgery  CLEAR LIQUID DIET  Foods Allowed                                                                     Foods Excluded  Water, Black Coffee and tea, regular and decaf                             liquids that you cannot  Plain Jell-O in any flavor  (No red)                                           see through such as: Fruit ices (not with fruit pulp)                                     milk, soups, orange juice              Iced Popsicles (No red)                                    All solid food                                   Apple juices Sports drinks like Gatorade (No red) Lightly seasoned clear broth or consume(fat free) Sugar, honey syrup  Sample Menu Breakfast                                Lunch                                     Supper Cranberry juice                    Beef broth  Chicken broth Jell-O                                     Grape juice                           Apple juice Coffee or tea                        Jell-O                                      Popsicle                                                Coffee or tea                        Coffee or tea      Oral Hygiene is also important  to reduce your risk of infection.                                    Remember - BRUSH YOUR TEETH THE MORNING OF SURGERY WITH YOUR REGULAR TOOTHPASTE   Do NOT smoke after Midnight   Take these medicines the morning of surgery with A SIP OF WATER: None                               You may not have any metal on your body including hair pins, jewelry, and body piercings             Do not wear make-up, lotions, powders, perfumes/cologne, or deodorant             Do not wear nail polish.  Do not shave  48 hours prior to surgery.              Do not bring valuables to the hospital. [place].   Contacts, dentures or bridgework may not be worn into surgery.    Patients discharged the day of surgery will not be allowed to drive home.   Special Instructions: Bring a copy of your healthcare power of attorney and living will documents         the day of surgery if you haven't scanned them in before.              Please read over the following fact sheets you were given: IF YOU HAVE QUESTIONS ABOUT YOUR PRE OP INSTRUCTIONS PLEASE CALL [phone]   [place] - Preparing for Surgery Before surgery, you can play an important role.  Because skin is not sterile, your skin needs to be as free of germs as possible.  You can reduce the number of germs on your skin by washing with CHG (chlorahexidine gluconate) soap before surgery.  CHG is an antiseptic cleaner which kills germs and bonds with the skin to continue  killing germs even after washing. Please DO NOT use if you have an allergy to CHG or antibacterial soaps.  If your skin becomes reddened/irritated stop using the CHG and inform your nurse when you arrive at Short Stay. Do not shave (including legs and underarms) for at least 48 hours prior to the first CHG shower.  You may shave your face/neck.  Please follow these instructions carefully:  1.  Shower with CHG Soap the night before surgery and  the  morning of surgery.  2.  If you choose to wash your hair, wash your hair first as usual with your normal  shampoo.  3.  After you shampoo, rinse your hair and body thoroughly to remove the shampoo.                             4.  Use CHG as you would any other liquid soap.  You can apply chg directly to the skin and wash.  Gently with a scrungie or clean washcloth.  5.  Apply the CHG Soap to your body ONLY FROM THE NECK DOWN.   Do   not use on face/ open                           Wound or open sores. Avoid contact with eyes, ears mouth and   genitals (private parts).                       Wash face,  Genitals (private parts) with your normal soap.             6.  Wash thoroughly, paying special attention to the area where your    surgery  will be performed.  7.  Thoroughly rinse your body with warm water from the neck down.  8.  DO NOT shower/wash with your normal soap after using and rinsing off the CHG Soap.                9.  Pat yourself dry with a clean towel.            10.  Wear clean pajamas.            11.  Place clean sheets on your bed the night of your first shower and do not  sleep with pets. Day of Surgery : Do not apply any lotions/deodorants the morning of surgery.  Please wear clean clothes to the hospital/surgery center.  FAILURE TO FOLLOW THESE INSTRUCTIONS MAY RESULT IN THE CANCELLATION OF YOUR SURGERY  PATIENT SIGNATURE_________________________________  NURSE SIGNATURE__________________________________  ________________________________________________________________________  WHAT IS A BLOOD TRANSFUSION? Blood Transfusion Information  A transfusion is the replacement of blood or some of its parts. Blood is made up of multiple cells which provide different functions.  Red blood cells carry oxygen and are used for blood loss replacement.  White blood cells fight against infection.  Platelets control bleeding.  Plasma helps clot blood.  Other blood products  are available for specialized needs, such as hemophilia or other clotting disorders. BEFORE THE TRANSFUSION  Who gives blood for transfusions?   Healthy volunteers who are fully evaluated to make sure their blood is safe. This is blood bank blood. Transfusion therapy is the safest it has ever been in the practice of medicine. Before blood is taken from a donor, a complete history is taken to make sure that person  has no history of diseases nor engages in risky social behavior (examples are intravenous drug use or sexual activity with multiple partners). The donor's travel history is screened to minimize risk of transmitting infections, such as malaria. The donated blood is tested for signs of infectious diseases, such as HIV and hepatitis. The blood is then tested to be sure it is compatible with you in order to minimize the chance of a transfusion reaction. If you or a relative donates blood, this is often done in anticipation of surgery and is not appropriate for emergency situations. It takes many days to process the donated blood. RISKS AND COMPLICATIONS Although transfusion therapy is very safe and saves many lives, the main dangers of transfusion include:   Getting an infectious disease.  Developing a transfusion reaction. This is an allergic reaction to something in the blood you were given. Every precaution is taken to prevent this. The decision to have a blood transfusion has been considered carefully by your caregiver before blood is given. Blood is not given unless the benefits outweigh the risks. AFTER THE TRANSFUSION  Right after receiving a blood transfusion, you will usually feel much better and more energetic. This is especially true if your red blood cells have gotten low (anemic). The transfusion raises the level of the red blood cells which carry oxygen, and this usually causes an energy increase.  The nurse administering the transfusion will monitor you carefully for  complications. HOME CARE INSTRUCTIONS  No special instructions are needed after a transfusion. You may find your energy is better. Speak with your caregiver about any limitations on activity for underlying diseases you may have. SEEK MEDICAL CARE IF:   Your condition is not improving after your transfusion.  You develop redness or irritation at the intravenous (IV) site. SEEK IMMEDIATE MEDICAL CARE IF:  Any of the following symptoms occur over the next 12 hours:  Shaking chills.  You have a temperature by mouth above 102 F (38.9 C), not controlled by medicine.  Chest, back, or muscle pain.  People around you feel you are not acting correctly or are confused.  Shortness of breath or difficulty breathing.  Dizziness and fainting.  You get a rash or develop hives.  You have a decrease in urine output.  Your urine turns a dark color or changes to pink, red, or brown. Any of the following symptoms occur over the next 10 days:  You have a temperature by mouth above 102 F (38.9 C), not controlled by medicine.  Shortness of breath.  Weakness after normal activity.  The white part of the eye turns yellow (jaundice).  You have a decrease in the amount of urine or are urinating less often.  Your urine turns a dark color or changes to pink, red, or brown. Document Released: 10/29/2000 Document Revised: 01/24/2012 Document Reviewed: 06/17/2008 [facility] Patient Information 2014 [place], [place].  _______________________________________________________________________

## 2021-03-13 ENCOUNTER — Other Ambulatory Visit (HOSPITAL_COMMUNITY)
Admission: RE | Admit: 2021-03-13 | Discharge: 2021-03-13 | Disposition: A | Discharge status: Home / Self Care | Payer: PRIVATE HEALTH INSURANCE | Source: Ambulatory Visit | Attending: Gynecologic Oncology | Admitting: Gynecologic Oncology

## 2021-03-13 ENCOUNTER — Encounter (HOSPITAL_COMMUNITY)
Admission: RE | Admit: 2021-03-13 | Discharge: 2021-03-13 | Disposition: A | Discharge status: Home / Self Care | Payer: PRIVATE HEALTH INSURANCE | Source: Ambulatory Visit | Attending: Gynecologic Oncology | Admitting: Gynecologic Oncology

## 2021-03-13 DIAGNOSIS — Z20822 Contact with and (suspected) exposure to covid-19: Secondary | ICD-10-CM | POA: Diagnosis not present

## 2021-03-13 DIAGNOSIS — Z01812 Encounter for preprocedural laboratory examination: Secondary | ICD-10-CM | POA: Insufficient documentation

## 2021-03-13 LAB — CBC
HCT: 43.3 % (ref 36.0–46.0)
Hemoglobin: 14.4 g/dL (ref 12.0–15.0)
MCH: 31.5 pg (ref 26.0–34.0)
MCHC: 33.3 g/dL (ref 30.0–36.0)
MCV: 94.7 fL (ref 80.0–100.0)
Platelets: 209 10*3/uL (ref 150–400)
RBC: 4.57 MIL/uL (ref 3.87–5.11)
RDW: 12.7 % (ref 11.5–15.5)
WBC: 6.2 10*3/uL (ref 4.0–10.5)
nRBC: 0 % (ref 0.0–0.2)

## 2021-03-13 LAB — COMPREHENSIVE METABOLIC PANEL
ALT: 13 U/L (ref 0–44)
AST: 24 U/L (ref 15–41)
Albumin: 4.5 g/dL (ref 3.5–5.0)
Alkaline Phosphatase: 59 U/L (ref 38–126)
Anion gap: 9 (ref 5–15)
BUN: 14 mg/dL (ref 6–20)
CO2: 22 mmol/L (ref 22–32)
Calcium: 10.3 mg/dL (ref 8.9–10.3)
Chloride: 111 mmol/L (ref 98–111)
Creatinine, Ser: 0.62 mg/dL (ref 0.44–1.00)
GFR, Estimated: >60 mL/min (ref >60)
Glucose, Bld: 92 mg/dL (ref 70–99)
Potassium: 3.9 mmol/L (ref 3.5–5.1)
Sodium: 142 mmol/L (ref 135–145)
Total Bilirubin: 1 mg/dL (ref 0.3–1.2)
Total Protein: 7.4 g/dL (ref 6.5–8.1)

## 2021-03-13 LAB — URINALYSIS, ROUTINE W REFLEX MICROSCOPIC
Bacteria, UA: NONE SEEN
Bilirubin Urine: NEGATIVE
Glucose, UA: NEGATIVE mg/dL
Ketones, ur: NEGATIVE mg/dL
Leukocytes,Ua: NEGATIVE
Nitrite: NEGATIVE
Protein, ur: NEGATIVE mg/dL
Specific Gravity, Urine: 1.014 (ref 1.005–1.030)
pH: 5 (ref 5.0–8.0)

## 2021-03-13 LAB — SARS CORONAVIRUS 2 (TAT 6-24 HRS): SARS Coronavirus 2: NEGATIVE

## 2021-03-16 ENCOUNTER — Telehealth: Payer: Self-pay

## 2021-03-16 NOTE — Telephone Encounter (Signed)
Sara Cook states that she understands her written pre-op instructions from 03-12-21.  Reviewed pain scale 0 to 10. And how to alternate the ibuprofen and tylenol and when to add the tramadol when pain level is a  5 or greater post operatively at home. Pt verbalized understanding.

## 2021-03-17 ENCOUNTER — Ambulatory Visit (HOSPITAL_COMMUNITY)
Admission: RE | Admit: 2021-03-17 | Discharge: 2021-03-17 | Disposition: A | Discharge status: Home / Self Care | Payer: No Typology Code available for payment source | Attending: Gynecologic Oncology | Admitting: Gynecologic Oncology

## 2021-03-17 ENCOUNTER — Encounter (HOSPITAL_COMMUNITY): Payer: Self-pay | Admitting: Gynecologic Oncology

## 2021-03-17 ENCOUNTER — Ambulatory Visit (HOSPITAL_COMMUNITY): Payer: No Typology Code available for payment source | Admitting: Certified Registered"

## 2021-03-17 ENCOUNTER — Other Ambulatory Visit: Payer: Self-pay

## 2021-03-17 ENCOUNTER — Encounter (HOSPITAL_COMMUNITY)
Admission: RE | Disposition: A | Discharge status: Home / Self Care | Payer: Self-pay | Source: Home / Self Care | Attending: Gynecologic Oncology

## 2021-03-17 DIAGNOSIS — Z87891 Personal history of nicotine dependence: Secondary | ICD-10-CM | POA: Diagnosis not present

## 2021-03-17 DIAGNOSIS — Z6841 Body Mass Index (BMI) 40.0 and over, adult: Secondary | ICD-10-CM | POA: Diagnosis not present

## 2021-03-17 DIAGNOSIS — C541 Malignant neoplasm of endometrium: Secondary | ICD-10-CM

## 2021-03-17 DIAGNOSIS — D259 Leiomyoma of uterus, unspecified: Secondary | ICD-10-CM | POA: Insufficient documentation

## 2021-03-17 HISTORY — DX: Personal history of diseases of the blood and blood-forming organs and certain disorders involving the immune mechanism: Z86.2

## 2021-03-17 HISTORY — DX: Malignant neoplasm of endometrium: C54.1

## 2021-03-17 HISTORY — DX: Pneumonia, unspecified organism: J18.9

## 2021-03-17 HISTORY — PX: ROBOTIC ASSISTED TOTAL HYSTERECTOMY WITH BILATERAL SALPINGO OOPHERECTOMY: SHX6086

## 2021-03-17 HISTORY — PX: SENTINEL NODE BIOPSY: SHX6608

## 2021-03-17 LAB — TYPE AND SCREEN
ABO/RH(D): A POS
Antibody Screen: NEGATIVE

## 2021-03-17 LAB — ABO/RH: ABO/RH(D): A POS

## 2021-03-17 SURGERY — HYSTERECTOMY, TOTAL, ROBOT-ASSISTED, LAPAROSCOPIC, WITH BILATERAL SALPINGO-OOPHORECTOMY
Anesthesia: General | Site: Abdomen

## 2021-03-17 MED ORDER — ROCURONIUM BROMIDE 10 MG/ML (PF) SYRINGE
PREFILLED_SYRINGE | INTRAVENOUS | Status: AC
  Filled 2021-03-17: qty 10

## 2021-03-17 MED ORDER — LIDOCAINE 2% (20 MG/ML) 5 ML SYRINGE
INTRAMUSCULAR | Status: DC | PRN
  Administered 2021-03-17: 60 mg via INTRAVENOUS

## 2021-03-17 MED ORDER — STERILE WATER FOR INJECTION IJ SOLN
INTRAMUSCULAR | Status: AC
  Filled 2021-03-17: qty 10

## 2021-03-17 MED ORDER — LIDOCAINE 20MG/ML (2%) 15 ML SYRINGE OPTIME
INTRAMUSCULAR | Status: DC | PRN
  Administered 2021-03-17: 1.5 mg/kg/h via INTRAVENOUS

## 2021-03-17 MED ORDER — AMISULPRIDE (ANTIEMETIC) 5 MG/2ML IV SOLN: 10.0000 mg | Freq: Once | INTRAVENOUS | Status: DC | PRN

## 2021-03-17 MED ORDER — MIDAZOLAM HCL 2 MG/2ML IJ SOLN
INTRAMUSCULAR | Status: DC | PRN
  Administered 2021-03-17: 2 mg via INTRAVENOUS

## 2021-03-17 MED ORDER — OXYCODONE HCL 5 MG PO TABS: 5.0000 mg | ORAL_TABLET | ORAL | Status: DC | PRN

## 2021-03-17 MED ORDER — CELECOXIB 200 MG PO CAPS
400.0000 mg | ORAL_CAPSULE | ORAL | Status: AC
Start: 2021-03-17 — End: 2021-03-17
  Administered 2021-03-17: 400 mg via ORAL
  Filled 2021-03-17: qty 2

## 2021-03-17 MED ORDER — MEPERIDINE HCL 50 MG/ML IJ SOLN: 6.2500 mg | INTRAMUSCULAR | Status: DC | PRN

## 2021-03-17 MED ORDER — HYDROMORPHONE HCL 1 MG/ML IJ SOLN
INTRAMUSCULAR | Status: AC
  Filled 2021-03-17: qty 1

## 2021-03-17 MED ORDER — SODIUM CHLORIDE 0.9% FLUSH
3.0000 mL | Freq: Two times a day (BID) | INTRAVENOUS | Status: DC
Start: 2021-03-17 — End: 2021-03-17

## 2021-03-17 MED ORDER — LACTATED RINGERS IV SOLN: INTRAVENOUS | Status: DC

## 2021-03-17 MED ORDER — ACETAMINOPHEN 500 MG PO TABS
1000.0000 mg | ORAL_TABLET | ORAL | Status: AC
Start: 2021-03-17 — End: 2021-03-17
  Administered 2021-03-17: 1000 mg via ORAL
  Filled 2021-03-17: qty 2

## 2021-03-17 MED ORDER — DEXAMETHASONE SODIUM PHOSPHATE 10 MG/ML IJ SOLN
INTRAMUSCULAR | Status: DC | PRN
  Administered 2021-03-17: 8 mg via INTRAVENOUS

## 2021-03-17 MED ORDER — KETOROLAC TROMETHAMINE 15 MG/ML IJ SOLN
15.0000 mg | Freq: Four times a day (QID) | INTRAMUSCULAR | Status: DC
Start: 2021-03-17 — End: 2021-03-17

## 2021-03-17 MED ORDER — ROCURONIUM BROMIDE 10 MG/ML (PF) SYRINGE
PREFILLED_SYRINGE | INTRAVENOUS | Status: DC | PRN
  Administered 2021-03-17: 20 mg via INTRAVENOUS
  Administered 2021-03-17: 100 mg via INTRAVENOUS

## 2021-03-17 MED ORDER — ONDANSETRON HCL 4 MG/2ML IJ SOLN
INTRAMUSCULAR | Status: AC
  Filled 2021-03-17: qty 2

## 2021-03-17 MED ORDER — GABAPENTIN 300 MG PO CAPS
300.0000 mg | ORAL_CAPSULE | ORAL | Status: AC
  Administered 2021-03-17: 300 mg via ORAL
  Filled 2021-03-17: qty 1

## 2021-03-17 MED ORDER — ENOXAPARIN SODIUM 40 MG/0.4ML IJ SOSY
40.0000 mg | PREFILLED_SYRINGE | INTRAMUSCULAR | Status: AC
Start: 2021-03-17 — End: 2021-03-17
  Administered 2021-03-17: 40 mg via SUBCUTANEOUS
  Filled 2021-03-17: qty 0.4

## 2021-03-17 MED ORDER — STERILE WATER FOR IRRIGATION IR SOLN
Status: DC | PRN
  Administered 2021-03-17: 1000 mL

## 2021-03-17 MED ORDER — LACTATED RINGERS IV SOLN: INTRAVENOUS | Status: DC | PRN

## 2021-03-17 MED ORDER — LACTATED RINGERS IR SOLN
Status: DC | PRN
  Administered 2021-03-17: 1000 mL

## 2021-03-17 MED ORDER — SUGAMMADEX SODIUM 200 MG/2ML IV SOLN
INTRAVENOUS | Status: DC | PRN
  Administered 2021-03-17: 250 mg via INTRAVENOUS

## 2021-03-17 MED ORDER — DEXAMETHASONE SODIUM PHOSPHATE 10 MG/ML IJ SOLN
INTRAMUSCULAR | Status: AC
  Filled 2021-03-17: qty 1

## 2021-03-17 MED ORDER — SCOPOLAMINE 1 MG/3DAYS TD PT72
1.0000 | MEDICATED_PATCH | TRANSDERMAL | Status: DC
  Administered 2021-03-17: 1.5 mg via TRANSDERMAL
  Filled 2021-03-17: qty 1

## 2021-03-17 MED ORDER — FENTANYL CITRATE (PF) 100 MCG/2ML IJ SOLN
INTRAMUSCULAR | Status: AC
  Filled 2021-03-17: qty 2

## 2021-03-17 MED ORDER — PROPOFOL 10 MG/ML IV BOLUS
INTRAVENOUS | Status: DC | PRN
  Administered 2021-03-17: 150 mg via INTRAVENOUS

## 2021-03-17 MED ORDER — ORAL CARE MOUTH RINSE: 15.0000 mL | Freq: Once | OROMUCOSAL | Status: AC

## 2021-03-17 MED ORDER — BUPIVACAINE HCL 0.25 % IJ SOLN
INTRAMUSCULAR | Status: AC
  Filled 2021-03-17: qty 1

## 2021-03-17 MED ORDER — BUPIVACAINE HCL 0.25 % IJ SOLN
INTRAMUSCULAR | Status: DC | PRN
  Administered 2021-03-17: 21 mL

## 2021-03-17 MED ORDER — OXYCODONE HCL 5 MG/5ML PO SOLN: 5.0000 mg | Freq: Once | ORAL | Status: DC | PRN

## 2021-03-17 MED ORDER — MIDAZOLAM HCL 2 MG/2ML IJ SOLN
INTRAMUSCULAR | Status: AC
  Filled 2021-03-17: qty 2

## 2021-03-17 MED ORDER — CEFAZOLIN IN SODIUM CHLORIDE 3-0.9 GM/100ML-% IV SOLN
3.0000 g | INTRAVENOUS | Status: AC
  Administered 2021-03-17: 3 g via INTRAVENOUS
  Filled 2021-03-17: qty 100

## 2021-03-17 MED ORDER — STERILE WATER FOR INJECTION IJ SOLN
INTRAMUSCULAR | Status: DC | PRN
  Administered 2021-03-17: 1 mL via INTRAMUSCULAR

## 2021-03-17 MED ORDER — PROPOFOL 10 MG/ML IV BOLUS
INTRAVENOUS | Status: AC
  Filled 2021-03-17: qty 20

## 2021-03-17 MED ORDER — OXYCODONE HCL 5 MG PO TABS
5.0000 mg | ORAL_TABLET | Freq: Once | ORAL | Status: DC | PRN
Start: 2021-03-17 — End: 2021-03-17

## 2021-03-17 MED ORDER — HYDROMORPHONE HCL 1 MG/ML IJ SOLN
0.2500 mg | INTRAMUSCULAR | Status: DC | PRN
  Administered 2021-03-17 (×3): 0.5 mg via INTRAVENOUS

## 2021-03-17 MED ORDER — CHLORHEXIDINE GLUCONATE 0.12 % MT SOLN
15.0000 mL | Freq: Once | OROMUCOSAL | Status: AC
Start: 2021-03-17 — End: 2021-03-17
  Administered 2021-03-17: 15 mL via OROMUCOSAL

## 2021-03-17 MED ORDER — KETAMINE HCL 10 MG/ML IJ SOLN
INTRAMUSCULAR | Status: AC
  Filled 2021-03-17: qty 1

## 2021-03-17 MED ORDER — DEXAMETHASONE SODIUM PHOSPHATE 4 MG/ML IJ SOLN: 4.0000 mg | INTRAMUSCULAR | Status: DC

## 2021-03-17 MED ORDER — FENTANYL CITRATE (PF) 250 MCG/5ML IJ SOLN
INTRAMUSCULAR | Status: DC | PRN
  Administered 2021-03-17: 50 ug via INTRAVENOUS
  Administered 2021-03-17 (×2): 25 ug via INTRAVENOUS
  Administered 2021-03-17: 100 ug via INTRAVENOUS

## 2021-03-17 MED ORDER — ONDANSETRON HCL 4 MG/2ML IJ SOLN
INTRAMUSCULAR | Status: DC | PRN
  Administered 2021-03-17: 4 mg via INTRAVENOUS

## 2021-03-17 MED ORDER — LIDOCAINE HCL 2 % IJ SOLN
INTRAMUSCULAR | Status: AC
  Filled 2021-03-17: qty 20

## 2021-03-17 MED ORDER — PHENYLEPHRINE HCL (PRESSORS) 10 MG/ML IV SOLN
INTRAVENOUS | Status: AC
  Filled 2021-03-17: qty 1

## 2021-03-17 MED ORDER — PROMETHAZINE HCL 25 MG/ML IJ SOLN: 6.2500 mg | INTRAMUSCULAR | Status: DC | PRN

## 2021-03-17 MED ORDER — KETAMINE HCL 10 MG/ML IJ SOLN
INTRAMUSCULAR | Status: DC | PRN
  Administered 2021-03-17 (×2): 15 mg via INTRAVENOUS

## 2021-03-17 SURGICAL SUPPLY — 78 items
ADH SKN CLS APL DERMABOND .7 (GAUZE/BANDAGES/DRESSINGS) ×2
AGENT HMST KT MTR STRL THRMB (HEMOSTASIS)
APL ESCP 34 STRL LF DISP (HEMOSTASIS)
APPLICATOR SURGIFLO ENDO (HEMOSTASIS) IMPLANT
BACTOSHIELD CHG 4% 4OZ (MISCELLANEOUS) ×1
BAG LAPAROSCOPIC 12 15 PORT 16 (BASKET) IMPLANT
BAG RETRIEVAL 12/15 (BASKET)
BAG SPEC RTRVL LRG 6X4 10 (ENDOMECHANICALS) ×2
BLADE SURG SZ10 CARB STEEL (BLADE) IMPLANT
CELLS DAT CNTRL 66122 CELL SVR (MISCELLANEOUS) IMPLANT
COVER BACK TABLE 60X90IN (DRAPES) ×3 IMPLANT
COVER TIP SHEARS 8 DVNC (MISCELLANEOUS) ×2 IMPLANT
COVER TIP SHEARS 8MM DA VINCI (MISCELLANEOUS) ×3
COVER WAND RF STERILE (DRAPES) IMPLANT
DECANTER SPIKE VIAL GLASS SM (MISCELLANEOUS) IMPLANT
DERMABOND ADVANCED (GAUZE/BANDAGES/DRESSINGS) ×1
DERMABOND ADVANCED .7 DNX12 (GAUZE/BANDAGES/DRESSINGS) ×2 IMPLANT
DRAPE ARM DVNC X/XI (DISPOSABLE) ×8 IMPLANT
DRAPE COLUMN DVNC XI (DISPOSABLE) ×2 IMPLANT
DRAPE DA VINCI XI ARM (DISPOSABLE) ×12
DRAPE DA VINCI XI COLUMN (DISPOSABLE) ×3
DRAPE SHEET LG 3/4 BI-LAMINATE (DRAPES) ×3 IMPLANT
DRAPE SURG IRRIG POUCH 19X23 (DRAPES) ×3 IMPLANT
DRSG OPSITE POSTOP 4X6 (GAUZE/BANDAGES/DRESSINGS) IMPLANT
DRSG OPSITE POSTOP 4X8 (GAUZE/BANDAGES/DRESSINGS) IMPLANT
ELECT PENCIL ROCKER SW 15FT (MISCELLANEOUS) IMPLANT
ELECT REM PT RETURN 15FT ADLT (MISCELLANEOUS) ×3 IMPLANT
GLOVE SURG ENC MOIS LTX SZ6 (GLOVE) ×12 IMPLANT
GLOVE SURG ENC MOIS LTX SZ6.5 (GLOVE) ×6 IMPLANT
GOWN STRL REUS W/ TWL LRG LVL3 (GOWN DISPOSABLE) ×8 IMPLANT
GOWN STRL REUS W/TWL LRG LVL3 (GOWN DISPOSABLE) ×12
HOLDER FOLEY CATH W/STRAP (MISCELLANEOUS) IMPLANT
IRRIG SUCT STRYKERFLOW 2 WTIP (MISCELLANEOUS) ×3
IRRIGATION SUCT STRKRFLW 2 WTP (MISCELLANEOUS) ×2 IMPLANT
KIT PROCEDURE DA VINCI SI (MISCELLANEOUS)
KIT PROCEDURE DVNC SI (MISCELLANEOUS) IMPLANT
KIT TURNOVER KIT A (KITS) ×3 IMPLANT
MANIPULATOR UTERINE 4.5 ZUMI (MISCELLANEOUS) ×3 IMPLANT
NDL SPNL 18GX3.5 QUINCKE PK (NEEDLE) IMPLANT
NEEDLE HYPO 22GX1.5 SAFETY (NEEDLE) ×3 IMPLANT
NEEDLE SPNL 18GX3.5 QUINCKE PK (NEEDLE) IMPLANT
OBTURATOR OPTICAL STANDARD 8MM (TROCAR) ×3
OBTURATOR OPTICAL STND 8 DVNC (TROCAR) ×2
OBTURATOR OPTICALSTD 8 DVNC (TROCAR) ×2 IMPLANT
PACK ROBOT GYN CUSTOM WL (TRAY / TRAY PROCEDURE) ×3 IMPLANT
PAD POSITIONING PINK XL (MISCELLANEOUS) ×3 IMPLANT
PORT ACCESS TROCAR AIRSEAL 12 (TROCAR) ×2 IMPLANT
PORT ACCESS TROCAR AIRSEAL 5M (TROCAR) ×1
POUCH SPECIMEN RETRIEVAL 10MM (ENDOMECHANICALS) ×1 IMPLANT
RETRACTOR WND ALEXIS 18 MED (MISCELLANEOUS) IMPLANT
RETRACTOR WND ALEXIS 25 LRG (MISCELLANEOUS) IMPLANT
RTRCTR WOUND ALEXIS 18CM MED (MISCELLANEOUS)
RTRCTR WOUND ALEXIS 25CM LRG (MISCELLANEOUS)
SCRUB CHG 4% DYNA-HEX 4OZ (MISCELLANEOUS) ×2 IMPLANT
SEAL CANN UNIV 5-8 DVNC XI (MISCELLANEOUS) ×6 IMPLANT
SEAL XI 5MM-8MM UNIVERSAL (MISCELLANEOUS) ×9
SET TRI-LUMEN FLTR TB AIRSEAL (TUBING) ×3 IMPLANT
SPONGE LAP 18X18 RF (DISPOSABLE) IMPLANT
SURGIFLO W/THROMBIN 8M KIT (HEMOSTASIS) IMPLANT
SUT MNCRL AB 4-0 PS2 18 (SUTURE) IMPLANT
SUT PDS AB 1 TP1 96 (SUTURE) IMPLANT
SUT VIC AB 0 CT1 27 (SUTURE) ×3
SUT VIC AB 0 CT1 27XBRD ANTBC (SUTURE) ×2 IMPLANT
SUT VIC AB 2-0 CT1 27 (SUTURE)
SUT VIC AB 2-0 CT1 TAPERPNT 27 (SUTURE) IMPLANT
SUT VIC AB 4-0 PS2 18 (SUTURE) ×6 IMPLANT
SUT VLOC 180 0 9IN  GS21 (SUTURE) ×6
SUT VLOC 180 0 9IN GS21 (SUTURE) ×2 IMPLANT
SYR 10ML LL (SYRINGE) IMPLANT
SYR 20ML LL LF (SYRINGE) IMPLANT
SYR 50ML LL SCALE MARK (SYRINGE) IMPLANT
TOWEL OR NON WOVEN STRL DISP B (DISPOSABLE) ×3 IMPLANT
TRAP SPECIMEN MUCUS 40CC (MISCELLANEOUS) IMPLANT
TRAY FOLEY MTR SLVR 16FR STAT (SET/KITS/TRAYS/PACK) ×3 IMPLANT
TROCAR XCEL NON-BLD 5MMX100MML (ENDOMECHANICALS) IMPLANT
UNDERPAD 30X36 HEAVY ABSORB (UNDERPADS AND DIAPERS) ×3 IMPLANT
WATER STERILE IRR 1000ML POUR (IV SOLUTION) ×3 IMPLANT
YANKAUER SUCT BULB TIP 10FT TU (MISCELLANEOUS) IMPLANT

## 2021-03-17 NOTE — Anesthesia Procedure Notes (Signed)
Procedure Name: Intubation Date/Time: 03/17/2021 10:32 AM Performed by: Eben Burow, CRNA Pre-anesthesia Checklist: Patient identified, Emergency Drugs available, Suction available, Patient being monitored and Timeout performed Patient Re-evaluated:Patient Re-evaluated prior to induction Oxygen Delivery Method: Circle system utilized Preoxygenation: Pre-oxygenation with 100% oxygen Induction Type: IV induction Ventilation: Mask ventilation without difficulty Laryngoscope Size: Mac and 4 Grade View: Grade I Tube type: Oral Tube size: 7.0 mm Number of attempts: 1 Airway Equipment and Method: Stylet Placement Confirmation: ETT inserted through vocal cords under direct vision,  positive ETCO2 and breath sounds checked- equal and bilateral Secured at: 22 cm Tube secured with: Tape Dental Injury: Teeth and Oropharynx as per pre-operative assessment

## 2021-03-17 NOTE — Anesthesia Preprocedure Evaluation (Signed)
Anesthesia Evaluation  Patient identified by MRN, date of birth, ID band Patient awake    Reviewed: Allergy & Precautions, NPO status , Patient's Chart, lab work & pertinent test results  Airway Mallampati: II  TM Distance: >3 FB Neck ROM: Full    Dental no notable dental hx.    Pulmonary neg pulmonary ROS, former smoker,    Pulmonary exam normal breath sounds clear to auscultation       Cardiovascular negative cardio ROS Normal cardiovascular exam Rhythm:Regular Rate:Normal     Neuro/Psych negative neurological ROS  negative psych ROS   GI/Hepatic negative GI ROS, Neg liver ROS,   Endo/Other  Morbid obesity  Renal/GU negative Renal ROS  negative genitourinary   Musculoskeletal negative musculoskeletal ROS (+)   Abdominal (+) + obese,   Peds negative pediatric ROS (+)  Hematology negative hematology ROS (+)   Anesthesia Other Findings Endometrial Cancer  Reproductive/Obstetrics negative OB ROS                             Anesthesia Physical Anesthesia Plan  ASA: III  Anesthesia Plan: General   Post-op Pain Management:    Induction: Intravenous  PONV Risk Score and Plan: 3 and Ondansetron, Dexamethasone, Midazolam and Treatment may vary due to age or medical condition  Airway Management Planned: Oral ETT  Additional Equipment:   Intra-op Plan:   Post-operative Plan: Extubation in OR  Informed Consent: I have reviewed the patients History and Physical, chart, labs and discussed the procedure including the risks, benefits and alternatives for the proposed anesthesia with the patient or authorized representative who has indicated his/her understanding and acceptance.     Dental advisory given  Plan Discussed with: CRNA  Anesthesia Plan Comments:         Anesthesia Quick Evaluation

## 2021-03-17 NOTE — Transfer of Care (Signed)
Immediate Anesthesia Transfer of Care Note  Patient: Sara Cook  Procedure(s) Performed: XI ROBOTIC ASSISTED TOTAL HYSTERECTOMY WITH BILATERAL SALPINGO OOPHORECTOMY (Bilateral Abdomen) SENTINEL NODE BIOPSY (N/A Abdomen)  Patient Location: PACU  Anesthesia Type:General  Level of Consciousness: awake, alert  and patient cooperative  Airway & Oxygen Therapy: Patient Spontanous Breathing and Patient connected to face mask oxygen  Post-op Assessment: Report given to RN and Post -op Vital signs reviewed and stable  Post vital signs: Reviewed and stable  Last Vitals:  Vitals Value Taken Time  BP 147/75 03/17/21 1238  Temp    Pulse 73 03/17/21 1239  Resp    SpO2 99 % 03/17/21 1239  Vitals shown include unvalidated device data.  Last Pain:  Vitals:   03/17/21 0901  TempSrc: Oral  PainSc:          Complications: No complications documented.

## 2021-03-17 NOTE — Interval H&P Note (Signed)
History and Physical Interval Note:  03/17/2021 9:46 AM  Sara Cook  has presented today for surgery, with the diagnosis of ENDOMETRIAL CANCER.  The various methods of treatment have been discussed with the patient and family. After consideration of risks, benefits and other options for treatment, the patient has consented to  Procedure(s): XI ROBOTIC ASSISTED TOTAL HYSTERECTOMY WITH BILATERAL SALPINGO OOPHORECTOMY (Bilateral) SENTINEL NODE BIOPSY (N/A) as a surgical intervention.  The patient's history has been reviewed, patient examined, no change in status, stable for surgery.  I have reviewed the patient's chart and labs.  Questions were answered to the patient's satisfaction.     [name]

## 2021-03-17 NOTE — Discharge Instructions (Signed)
03/17/2021  Return to work: 4 weeks  Activity: 1. Be up and out of the bed during the day.  Take a nap if needed.  You may walk up steps but be careful and use the hand rail.  Stair climbing will tire you more than you think, you may need to stop part way and rest.   2. No lifting or straining for 6 weeks.  3. No driving for 1 week.  Do Not drive if you are taking narcotic pain medicine.  4. Shower daily.  Use soap and water on your incision and pat dry; don't rub.   5. No sexual activity and nothing in the vagina for 8 weeks.  Medications:  - Take ibuprofen and tylenol first line for pain control. Take these regularly (every 6 hours) to decrease the build up of pain.  - If necessary, for severe pain not relieved by ibuprofen, take oxycodone.  - While taking oxycodone you should take sennakot every night to reduce the likelihood of constipation. If this causes diarrhea, stop its use.  Diet: 1. Low sodium Heart Healthy Diet is recommended.  2. It is safe to use a laxative if you have difficulty moving your bowels.   Wound Care: 1. Keep clean and dry.  Shower daily.  Reasons to call the Doctor:   Fever - Oral temperature greater than 100.4 degrees Fahrenheit  Foul-smelling vaginal discharge  Difficulty urinating  Nausea and vomiting  Increased pain at the site of the incision that is unrelieved with pain medicine.  Difficulty breathing with or without chest pain  New calf pain especially if only on one side  Sudden, continuing increased vaginal bleeding with or without clots.   Follow-up: 1. See Everitt Amber in 3 weeks.  Contacts: For questions or concerns you should contact:  Dr. Everitt Amber at [phone] After hours and on week-ends call [phone] and ask to speak to the physician on call for Gynecologic Oncology

## 2021-03-17 NOTE — Op Note (Signed)
OPERATIVE NOTE 03/17/21  Surgeon: Donaciano Eva   Assistants: Dr Lahoma Crocker (an MD assistant was necessary for tissue manipulation, management of robotic instrumentation, retraction and positioning due to the complexity of the case and hospital policies).   Anesthesia: General endotracheal anesthesia  ASA Class: 3   Pre-operative Diagnosis: endometrial cancer grade 1  Post-operative Diagnosis: same,   Operation: Robotic-assisted laparoscopic total hysterectomy with bilateral salpingoophorectomy, bilateral SLN biopsy   Surgeon: Donaciano Eva  Assistant Surgeon: Lahoma Crocker MD  Anesthesia: GET  Urine Output: 175cc  Operative Findings:  : 8cm normal appearing uterus, normal tubes and ovaries. signficant abdominal obesity. No gross extrauterine disease. Bilateral SLN mapping.   Estimated Blood Loss:  25cc      Total IV Fluids: 800 ml         Specimens: uterus, cervix, bilateral tubes and ovaries, right external iliac SLN, left distal common iliac SLN, left proximal common iliac SLN.         Complications:  None; patient tolerated the procedure well.         Disposition: PACU - hemodynamically stable.  Procedure Details  The patient was seen in the Holding Room. The risks, benefits, complications, treatment options, and expected outcomes were discussed with the patient.  The patient concurred with the proposed plan, giving informed consent.  The site of surgery properly noted/marked. The patient was identified as Chief Executive Officer and the procedure verified as a Robotic-assisted hysterectomy with bilateral salpingo oophorectomy with SLN biopsy. A Time Out was held and the above information confirmed.  After induction of anesthesia, the patient was draped and prepped in the usual sterile manner. Pt was placed in supine position after anesthesia and draped and prepped in the usual sterile manner. The abdominal drape was placed after the CholoraPrep had been  allowed to dry for 3 minutes.  Her arms were tucked to her side with all appropriate precautions.  The shoulders were stabilized with padded shoulder blocks applied to the acromium processes.  The patient was placed in the semi-lithotomy position in [place].  The perineum was prepped with Betadine. The patient was then prepped. Foley catheter was placed.  A sterile speculum was placed in the vagina.  The cervix was grasped with a single-tooth tenaculum. 2mg  total of ICG was injected into the cervical stroma at the left and right cervical face with 1cc injected at a 1cm and 26mm depth (concentration 0.5mg /ml) in all locations. The cervix was dilated with Kennon Rounds dilators.  The ZUMI uterine manipulator with a medium colpotomizer ring was placed without difficulty.  A pneum occluder balloon was placed over the manipulator.  OG tube placement was confirmed and to suction.   Next, a 5 mm skin incision was made 1 cm below the subcostal margin in the midclavicular line.  The 5 mm Optiview port and scope was used for direct entry.  Opening pressure was under 10 mm CO2.  The abdomen was insufflated and the findings were noted as above.   At this point and all points during the procedure, the patient's intra-abdominal pressure did not exceed 15 mmHg. Next, a 10 mm skin incision was made in the umbilicus and a right and left port was placed about 10 cm lateral to the robot port on the right and left side.  A fourth arm was placed in the left lower quadrant 2 cm above and superior and medial to the anterior superior iliac spine.  All ports were placed under direct visualization.  The patient was placed in steep Trendelenburg.  Bowel was folded away into the upper abdomen.  The robot was docked in the normal manner.  The right and left peritoneum were opened parallel to the IP ligament to open the retroperitoneal spaces bilaterally. The SLN mapping was performed in bilateral pelvic basins. The para rectal and paravesical  spaces were opened up entirely with careful dissection below the level of the ureters bilaterally and to the depth of the uterine artery origin in order to skeletonize the uterine "web" and ensure visualization of all parametrial channels. The para-aortic basins were carefully exposed and evaluated for isolated para-aortic SLN's. Lymphatic channels were identified travelling to the following visualized sentinel lymph node's: right external iliac SLN, left distal and proximal common iliac SLNs. These SLN's were separated from their surrounding lymphatic tissue, removed and sent for permanent pathology.  The hysterectomy was started after the round ligament on the right side was incised and the retroperitoneum was entered and the pararectal space was developed.  The ureter was noted to be on the medial leaf of the broad ligament.  The peritoneum above the ureter was incised and stretched and the infundibulopelvic ligament was skeletonized, cauterized and cut.  The posterior peritoneum was taken down to the level of the KOH ring.  The anterior peritoneum was also taken down.  The bladder flap was created to the level of the KOH ring.  The uterine artery on the right side was skeletonized, cauterized and cut in the normal manner.  A similar procedure was performed on the left.  The colpotomy was made and the uterus, cervix, bilateral ovaries and tubes were amputated and delivered through the vagina.  Pedicles were inspected and excellent hemostasis was achieved.    The colpotomy at the vaginal cuff was closed with Vicryl on a CT1 needle in figure of 8 sutures at the corners and running 0 v-loc centrally in a double layer closure manner.  Irrigation was used and excellent hemostasis was achieved.  At this point in the procedure was completed.  Robotic instruments were removed under direct visulaization.  The robot was undocked. The 10 mm ports were closed with Vicryl on a UR-5 needle and the fascia was closed with 0  Vicryl on a UR-5 needle.  The skin was closed with 4-0 Vicryl in a subcuticular manner.  Dermabond was applied.  Sponge, lap and needle counts correct x 2.  The patient was taken to the recovery room in stable condition.  The vagina was swabbed with  minimal bleeding noted.   All instrument and needle counts were correct x  3.   The patient was transferred to the recovery room in a stable condition.  Donaciano Eva, MD

## 2021-03-17 NOTE — Anesthesia Postprocedure Evaluation (Signed)
Anesthesia Post Note  Patient: Sara Cook  Procedure(s) Performed: XI ROBOTIC ASSISTED TOTAL HYSTERECTOMY WITH BILATERAL SALPINGO OOPHORECTOMY (Bilateral Abdomen) SENTINEL NODE BIOPSY (N/A Abdomen)     Patient location during evaluation: PACU Anesthesia Type: General Level of consciousness: awake and alert Pain management: pain level controlled Vital Signs Assessment: post-procedure vital signs reviewed and stable Respiratory status: spontaneous breathing, nonlabored ventilation and respiratory function stable Cardiovascular status: blood pressure returned to baseline and stable Postop Assessment: no apparent nausea or vomiting Anesthetic complications: no   No complications documented.  Last Vitals:  Vitals:   03/17/21 1330 03/17/21 1356  BP: 128/75 122/79  Pulse: 82 65  Resp: 13 20  Temp:  36.7 C  SpO2: 92%     Last Pain:  Vitals:   03/17/21 1356  TempSrc: Oral  PainSc: 3                  Lynda Rainwater

## 2021-03-18 ENCOUNTER — Encounter (HOSPITAL_COMMUNITY): Payer: Self-pay | Admitting: Gynecologic Oncology

## 2021-03-18 ENCOUNTER — Telehealth: Payer: Self-pay

## 2021-03-18 NOTE — Telephone Encounter (Signed)
Sara Cook states that she is eating,drinking, and urinating well. She is experiencing some burning with urination.  Told this should improve in the next 24 hours.  If the burning has not resolved by Friday 03-20-21 am, she needs to call the office to get an appointment to come in to give a urine specimen. Told her to increase fluids to 64 oz daily to help flush bladder. Passing gas. No BM. Pt will increase the senokot-s to 2 tabs bid with 8 oz of water.  If no BM by Friday 03-20-21 am, she can add a capful of Miralax bid.  Pt verbalized understanding. Afebrile. Incisions D&I Current pain medication effective for good pain relief. Pt aware of post op appointments as well as the office number [phone] and after hours number [phone] to call if she has any questions or concerns

## 2021-03-20 ENCOUNTER — Other Ambulatory Visit: Payer: Self-pay

## 2021-03-23 LAB — SURGICAL PATHOLOGY

## 2021-03-24 ENCOUNTER — Inpatient Hospital Stay: Payer: PRIVATE HEALTH INSURANCE | Attending: Gynecologic Oncology | Admitting: Gynecologic Oncology

## 2021-03-24 DIAGNOSIS — R600 Localized edema: Secondary | ICD-10-CM | POA: Insufficient documentation

## 2021-03-24 DIAGNOSIS — Z90710 Acquired absence of both cervix and uterus: Secondary | ICD-10-CM

## 2021-03-24 DIAGNOSIS — C541 Malignant neoplasm of endometrium: Secondary | ICD-10-CM

## 2021-03-24 DIAGNOSIS — Z90722 Acquired absence of ovaries, bilateral: Secondary | ICD-10-CM

## 2021-03-24 DIAGNOSIS — Z7189 Other specified counseling: Secondary | ICD-10-CM

## 2021-03-24 NOTE — Progress Notes (Signed)
Gynecologic Oncology Telehealth Follow-up Note  I connected with Sara Cook on 03/24/21 at  3:15 PM EDT by telephone and verified that I am speaking with the correct person using two identifiers.  I discussed the limitations, risks, security and privacy concerns of performing an evaluation and management service by telemedicine and the availability of in-person appointments. I also discussed with the patient that there may be a patient responsible charge related to this service. The patient expressed understanding and agreed to proceed.  Other persons participating in the visit and their role in the encounter: none.  Patient's location: home Provider's location: [place]  Chief Complaint:  Chief Complaint  Patient presents with  . counseling and coordination of care for endometrial cancer     Assessment/Plan:  Sara Cook  is a 55 y.o.  year old P2 with stage IB grade 1 endometrial cancer, MMR normal. S/p staging on 03/17/21.  Pathology revealed low risk factors for recurrence, therefore no adjuvant therapy is recommended according to NCCN guidelines.  I discussed risk for recurrence and typical symptoms encouraged her to notify us of these should they develop between visits.  I recommend she have follow-up every 6 months for 5 years in accordance with NCCN guidelines. Those visits should include symptom assessment, physical exam and pelvic examination. Pap smears are not indicated or recommended in the routine surveillance of endometrial cancer.  HPI: Sara Cook is a 55 year old P2 who was seen in consultation at the request of Dr Pamala Hurry for evaluation and treatment of grade 1 endometrial cancer.   Her symptoms began in the early part of the year of 2021 with postmenopausal spotting, She mentioned this to her PCP during her annual wellness visit and her PCP let her know that she would facilitate a referral to a gynecologist. The patient stated that  referral was delayed due to difficulties scheduling due to the [place] pandemic.  She saw Dr Pamala Hurry for a new patient gyn visit for this symptom on 02/24/21.  Work-up of symptoms included a pap smear and endometrial biopsy. Transvaginal US was not performed. Endometrial sampling with an endometrial pipelle was performed on 02/24/21 and showed a FIGO grade 1 endometrioid endometrial cancer.   Pap smear at that visit was normal and negative for high risk HPV.  Interval Hx:  On 03/17/21 she underwent robotic assisted total hysterectomy with BSO and bilateral SLN biopsy. Intraoperative findings were significant for no gross extrauterine disease, bilateral SLN mapping. Surgery was uncomplicated.  Final pathology revealed a FIGO grade 1 endometrial cancer with no LVSI. There was 10 of 95m myometrial invasion (outer half). SLN's, cervix and adnexa were benign. MMR normal/preserved. The patient was determined to have a stage IB grade 1 endometrial cancer with low risk features and no adjuvant therapy was recommended in accordance with NCCN guidelines.  Since surgery she has done well with no complaints.   Current Meds:  Outpatient Encounter Medications as of 03/24/2021  Medication Sig  . acetaminophen (TYLENOL) 500 MG tablet Take 500-1,000 mg by mouth every 6 (six) hours as needed for moderate pain.  .Marland Kitchenibuprofen (ADVIL) 800 MG tablet Take 1 tablet (800 mg total) by mouth every 8 (eight) hours as needed for moderate pain. For AFTER surgery only  . Multiple Vitamin (MULTIVITAMIN) capsule Take 1 capsule by mouth daily.  .Marland Kitchensenna-docusate (SENOKOT-S) 8.6-50 MG tablet Take 2 tablets by mouth at bedtime. For AFTER surgery, do not take if having diarrhea  . traMADol (ULTRAM)  50 MG tablet Take 1 tablet (50 mg total) by mouth every 6 (six) hours as needed for severe pain. For AFTER surgery, do not take and drive  . triamcinolone cream (KENALOG) 0.5 % Apply 1 application topically daily as needed (irritation).    No facility-administered encounter medications on file as of 03/24/2021.    Allergy: No Known Allergies  Social Hx:   Social History   Socioeconomic History  . Marital status: Married    Spouse name: Not on file  . Number of children: Not on file  . Years of education: Not on file  . Highest education level: Not on file  Occupational History  . Not on file  Tobacco Use  . Smoking status: Former Smoker    Packs/day: 1.00    Years: 30.00    Pack years: 30.00    Types: Cigarettes    Quit date: 02/07/1999    Years since quitting: 22.1  . Smokeless tobacco: Never Used  Vaping Use  . Vaping Use: Never used  Substance and Sexual Activity  . Alcohol use: Yes    Comment: occasionally  . Drug use: Never  . Sexual activity: Not Currently    Birth control/protection: Post-menopausal  Other Topics Concern  . Not on file  Social History Narrative  . Not on file   Social Determinants of Health   Financial Resource Strain: Not on file  Food Insecurity: Not on file  Transportation Needs: Not on file  Physical Activity: Not on file  Stress: Not on file  Social Connections: Not on file  Intimate Partner Violence: Not on file    Past Surgical Hx:  Past Surgical History:  Procedure Laterality Date  . COLONOSCOPY    . MOUTH SURGERY    . ROBOTIC ASSISTED TOTAL HYSTERECTOMY WITH BILATERAL SALPINGO OOPHERECTOMY Bilateral 03/17/2021   Procedure: XI ROBOTIC ASSISTED TOTAL HYSTERECTOMY WITH BILATERAL SALPINGO OOPHORECTOMY;  Surgeon: Everitt Amber, MD;  Location: [facility];  Service: Gynecology;  Laterality: Bilateral;  . SENTINEL NODE BIOPSY N/A 03/17/2021   Procedure: SENTINEL NODE BIOPSY;  Surgeon: Everitt Amber, MD;  Location: [facility];  Service: Gynecology;  Laterality: N/A;    Past Medical Hx:  Past Medical History:  Diagnosis Date  . Endometrial cancer ([place])   . History of iron deficiency anemia   . PMB (postmenopausal bleeding) 02/19/2021  . Pneumonia    history of a child     Past Gynecological History: SVD x2 No LMP recorded. Patient is postmenopausal.  Family Hx:  Family History  Problem Relation Age of Onset  . Breast cancer Neg Hx   . Colon cancer Neg Hx   . Ovarian cancer Neg Hx   . Endometrial cancer Neg Hx   . Pancreatic cancer Neg Hx   . Prostate cancer Neg Hx     Review of Systems:  Constitutional  Feels well,    ENT Normal appearing ears and nares bilaterally Skin/Breast  No rash, sores, jaundice, itching, dryness Cardiovascular  No chest pain, shortness of breath, or edema  Pulmonary  No cough or wheeze.  Gastro Intestinal  No nausea, vomitting, or diarrhoea. No bright red blood per rectum, no abdominal pain, change in bowel movement, or constipation.  Genito Urinary  No frequency, urgency, dysuria, no bleeding Musculo Skeletal  No myalgia, arthralgia, joint swelling or pain  Neurologic  No weakness, numbness, change in gait,  Psychology  No depression, anxiety, insomnia.   Vitals:  There were no vitals taken for this visit.  Physical  Exam: Deferred  I discussed the assessment and treatment plan with the patient. The patient was provided with an opportunity to ask questions and all were answered. The patient agreed with the plan and demonstrated an understanding of the instructions.   The patient was advised to call back or see an in-person evaluation if the symptoms worsen or if the condition fails to improve as anticipated.   I provided 10 minutes of non face-to-face telephone visit time during this encounter, and > 50% was spent counseling as documented under my assessment & plan.   [name], MD  03/24/2021, 2:43 PM

## 2021-04-01 ENCOUNTER — Encounter (HOSPITAL_COMMUNITY): Payer: Self-pay | Admitting: Gynecologic Oncology

## 2021-04-03 ENCOUNTER — Encounter: Payer: Self-pay | Admitting: Gynecologic Oncology

## 2021-04-06 ENCOUNTER — Inpatient Hospital Stay (HOSPITAL_BASED_OUTPATIENT_CLINIC_OR_DEPARTMENT_OTHER): Payer: PRIVATE HEALTH INSURANCE | Admitting: Gynecologic Oncology

## 2021-04-06 ENCOUNTER — Other Ambulatory Visit: Payer: Self-pay

## 2021-04-06 VITALS — BP 157/92 | HR 106 | Temp 97.5°F | Resp 20 | Wt 281.0 lb

## 2021-04-06 DIAGNOSIS — Z90722 Acquired absence of ovaries, bilateral: Secondary | ICD-10-CM | POA: Diagnosis not present

## 2021-04-06 DIAGNOSIS — R600 Localized edema: Secondary | ICD-10-CM | POA: Diagnosis not present

## 2021-04-06 DIAGNOSIS — Z7189 Other specified counseling: Secondary | ICD-10-CM

## 2021-04-06 DIAGNOSIS — Z90710 Acquired absence of both cervix and uterus: Secondary | ICD-10-CM

## 2021-04-06 DIAGNOSIS — C541 Malignant neoplasm of endometrium: Secondary | ICD-10-CM | POA: Diagnosis present

## 2021-04-06 MED ORDER — CEPHALEXIN 500 MG PO CAPS: 500.0000 mg | ORAL_CAPSULE | Freq: Four times a day (QID) | ORAL | 0 refills | Status: DC

## 2021-04-06 NOTE — Patient Instructions (Signed)
Dr Denman George has ordered an ultrasound of your right leg to ensure that the swelling is not from a blood clot.  Dr Denman George recommends applying neosporin ointment to your belly button until the wound heals.   Dr Denman George recommends that you take the Keflex antibiotic tablet 4 times a day for 1 week to heal the belly button wound.   Please return to see Dr Denman George in 6 months for your cancer follow-up.  You will see Dr Pamala Hurry again in 12 months (every May) for follow-up.   Your cancer was a stage IB endometrial cancer, and no other treatments are required.

## 2021-04-06 NOTE — Progress Notes (Signed)
Gynecologic Oncology Follow-up Note  Chief Complaint:  No chief complaint on file.  Assessment/Plan:  [name]  is a 55 y.o.  year old P2 with stage IB grade 1 endometrial cancer, MMR normal/MSI stable. S/p staging on 03/17/21.  Mild wound infection of umbiical incision - recommend 1 week keflex and neosporin ointment.  Rt LE edema - doppler ordered to rule out DVT.  Pathology revealed low risk factors for recurrence, therefore no adjuvant therapy is recommended according to NCCN guidelines.  I discussed risk for recurrence and typical symptoms encouraged her to notify us of these should they develop between visits.  I recommend she have follow-up every 6 months for 5 years in accordance with NCCN guidelines. Those visits should include symptom assessment, physical exam and pelvic examination. Pap smears are not indicated or recommended in the routine surveillance of endometrial cancer.  HPI: [name] is a 55 year old P2 who was seen in consultation at the request of Dr Sara Cook for evaluation and treatment of grade 1 endometrial cancer.   Her symptoms began in the early part of the year of 2021 with postmenopausal spotting, She mentioned this to her PCP during her annual wellness visit and her PCP let her know that she would facilitate a referral to a gynecologist. The patient stated that referral was delayed due to difficulties scheduling due to the [place] pandemic.  She saw Dr Sara Cook for a new patient gyn visit for this symptom on 02/24/21.  Work-up of symptoms included a pap smear and endometrial biopsy. Transvaginal US was not performed. Endometrial sampling with an endometrial pipelle was performed on 02/24/21 and showed a FIGO grade 1 endometrioid endometrial cancer.  Pap smear at that visit was normal and negative for high risk HPV.  Interval Hx:  On 03/17/21 she underwent robotic assisted total hysterectomy with BSO and bilateral SLN biopsy. Intraoperative  findings were significant for no gross extrauterine disease, bilateral SLN mapping (left distal and proximal common iliac and right external iliac). Surgery was uncomplicated.  Final pathology revealed a FIGO grade 1 endometrial cancer with no LVSI. There was 10 of 19m myometrial invasion (outer half). SLN's, cervix and adnexa were benign. MMR normal/preserved. The patient was determined to have a stage IB grade 1 endometrial cancer with low risk features and no adjuvant therapy was recommended in accordance with NCCN guidelines.  Since surgery she has done well with complaints of left Genitofemoral nerve distribution numbness, and right LE edema. Her umbilicus is irritated and inflamed.    Current Meds:  Outpatient Encounter Medications as of 04/06/2021  Medication Sig  . acetaminophen (TYLENOL) 500 MG tablet Take 500-1,000 mg by mouth every 6 (six) hours as needed for moderate pain.  . cephALEXin (KEFLEX) 500 MG capsule Take 1 capsule (500 mg total) by mouth 4 (four) times daily.  .Marland Kitchentriamcinolone cream (KENALOG) 0.5 % Apply 1 application topically daily as needed (irritation).  .Marland Kitchenibuprofen (ADVIL) 800 MG tablet Take 1 tablet (800 mg total) by mouth every 8 (eight) hours as needed for moderate pain. For AFTER surgery only (Patient not taking: Reported on 04/03/2021)  . Multiple Vitamin (MULTIVITAMIN) capsule Take 1 capsule by mouth daily. (Patient not taking: Reported on 04/03/2021)  . senna-docusate (SENOKOT-S) 8.6-50 MG tablet Take 2 tablets by mouth at bedtime. For AFTER surgery, do not take if having diarrhea (Patient not taking: Reported on 04/03/2021)  . traMADol (ULTRAM) 50 MG tablet Take 1 tablet (50 mg total) by mouth every 6 (six)  hours as needed for severe pain. For AFTER surgery, do not take and drive (Patient not taking: Reported on 04/03/2021)   No facility-administered encounter medications on file as of 04/06/2021.    Allergy: No Known Allergies  Social Hx:   Social History    Socioeconomic History  . Marital status: Married    Spouse name: Not on file  . Number of children: Not on file  . Years of education: Not on file  . Highest education level: Not on file  Occupational History  . Not on file  Tobacco Use  . Smoking status: Former Smoker    Packs/day: 1.00    Years: 30.00    Pack years: 30.00    Types: Cigarettes    Quit date: 02/07/1999    Years since quitting: 22.1  . Smokeless tobacco: Never Used  Vaping Use  . Vaping Use: Never used  Substance and Sexual Activity  . Alcohol use: Yes    Comment: occasionally  . Drug use: Never  . Sexual activity: Not Currently    Birth control/protection: Post-menopausal  Other Topics Concern  . Not on file  Social History Narrative  . Not on file   Social Determinants of Health   Financial Resource Strain: Not on file  Food Insecurity: Not on file  Transportation Needs: Not on file  Physical Activity: Not on file  Stress: Not on file  Social Connections: Not on file  Intimate Partner Violence: Not on file    Past Surgical Hx:  Past Surgical History:  Procedure Laterality Date  . COLONOSCOPY    . MOUTH SURGERY    . ROBOTIC ASSISTED TOTAL HYSTERECTOMY WITH BILATERAL SALPINGO OOPHERECTOMY Bilateral 03/17/2021   Procedure: XI ROBOTIC ASSISTED TOTAL HYSTERECTOMY WITH BILATERAL SALPINGO OOPHORECTOMY;  Surgeon: Everitt Amber, MD;  Location: [facility];  Service: Gynecology;  Laterality: Bilateral;  . SENTINEL NODE BIOPSY N/A 03/17/2021   Procedure: SENTINEL NODE BIOPSY;  Surgeon: Everitt Amber, MD;  Location: [facility];  Service: Gynecology;  Laterality: N/A;    Past Medical Hx:  Past Medical History:  Diagnosis Date  . Endometrial cancer ([place])   . History of iron deficiency anemia   . PMB (postmenopausal bleeding) 02/19/2021  . Pneumonia    history of a child    Past Gynecological History: SVD x2 No LMP recorded. Patient is postmenopausal.  Family Hx:  Family History  Problem Relation Age of Onset   . Breast cancer Neg Hx   . Colon cancer Neg Hx   . Ovarian cancer Neg Hx   . Endometrial cancer Neg Hx   . Pancreatic cancer Neg Hx   . Prostate cancer Neg Hx     Review of Systems:  Constitutional  Feels well,    ENT Normal appearing ears and nares bilaterally Skin/Breast  No rash, sores, jaundice, itching, dryness Cardiovascular  No chest pain, shortness of breath, or edema  Pulmonary  No cough or wheeze.  Gastro Intestinal  No nausea, vomitting, or diarrhoea. No bright red blood per rectum, no abdominal pain, change in bowel movement, or constipation.  Genito Urinary  No frequency, urgency, dysuria, no bleeding Musculo Skeletal  + Rt lower extremity edema Neurologic  + patch of numbness along left proximal thigh consistent with distribution of genitofemoral nerve.  Psychology  No depression, anxiety, insomnia.   Vitals:  Blood pressure (!) 157/92, pulse (!) 106, temperature (!) 97.5 F (36.4 C), temperature source Tympanic, resp. rate 20, weight 281 lb (127.5 kg), SpO2 97 %.  Physical Exam: WD in NAD Neck  Supple NROM, without any enlargements.  Lymph Node Survey No cervical supraclavicular or inguinal adenopathy Cardiovascular  Warm well perfused peripheries Lungs  No increased WOB Skin  No rash/lesions/breakdown  Psychiatry  Alert and oriented to person, place, and time  Abdomen  Normoactive bowel sounds, abdomen soft, non-tender and obese without evidence of hernia. The umbilical incision is inflamed and tender, the others healed well Back No CVA tenderness Genito Urinary  Vaginal cuff in tact, no bleeding, no discharge. Sutures not palpable. Rectal  Good tone, no masses no cul de sac nodularity.  Extremities  No bilateral cyanosis, clubbing or edema.    30 minutes of direct face to face counseling time was spent with the patient. This included discussion about prognosis, therapy recommendations and postoperative side effects and are beyond the scope  of routine postoperative care.  [name], MD  04/06/2021, 3:13 PM

## 2021-04-07 ENCOUNTER — Telehealth: Payer: Self-pay

## 2021-04-07 ENCOUNTER — Ambulatory Visit (HOSPITAL_COMMUNITY): Payer: PRIVATE HEALTH INSURANCE

## 2021-04-07 NOTE — Telephone Encounter (Addendum)
[name] stated that a gentleman called yesterday and stated that the VAS [place] would not be covered by her insurance. It would cost $400.00 plus and she cannot afford it. She stated that the swelling in only in her right ankle. The swelling has decreased since yesterday's visit with Sara Cook.  She is not having any pain with bearing weight on either leg with walking. Above reviewed with Sara Cross,NP.

## 2021-04-23 ENCOUNTER — Telehealth: Payer: Self-pay

## 2021-04-23 NOTE — Telephone Encounter (Signed)
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 Does a lot ob bending and walking.  She continues to have pain intermittently with bending. Pain 7/10.She feels she cannot return to work on 04-27-21. Dr. Denman George feels that medically she is fine to return to work.  She will allow the lifting, pushing, and pulling restrictions of no greater than 10 lbs to continue for 4 more weeks. She can work with out restrictions beginning 05-25-21. Sent return to work note to HR at [employer]  Attention Olen Pel. She is at [phone]. General number for replacement is [phone]. Faxed disability forms with office notes and office notes to [facility].

## 2021-05-22 ENCOUNTER — Ambulatory Visit: Payer: Self-pay | Admitting: *Deleted

## 2021-05-22 ENCOUNTER — Other Ambulatory Visit: Payer: Self-pay

## 2021-05-22 VITALS — BP 135/97 | HR 91 | Ht 67.0 in | Wt 281.0 lb

## 2021-05-22 DIAGNOSIS — Z0000 Encounter for general adult medical examination without abnormal findings: Secondary | ICD-10-CM

## 2021-05-22 NOTE — Progress Notes (Signed)
[employer] insurance premium discount evaluation: Labs Drawn. Replacements ROI form signed. Tobacco Free Attestation form signed.  Forms placed in paper chart.  

## 2021-05-23 ENCOUNTER — Encounter: Payer: Self-pay | Admitting: Registered Nurse

## 2021-05-23 LAB — CMP12+LP+TP+TSH+6AC+CBC/D/PLT
ALT: 11 IU/L (ref 0–32)
AST: 23 IU/L (ref 0–40)
Albumin/Globulin Ratio: 1.9 (ref 1.2–2.2)
Albumin: 4.5 g/dL (ref 3.8–4.9)
Alkaline Phosphatase: 72 IU/L (ref 44–121)
BUN/Creatinine Ratio: 20 (ref 9–23)
BUN: 14 mg/dL (ref 6–24)
Basophils Absolute: 0.1 10*3/uL (ref 0.0–0.2)
Basos: 1 %
Bilirubin Total: 0.8 mg/dL (ref 0.0–1.2)
Calcium: 10.4 mg/dL — ABNORMAL HIGH (ref 8.7–10.2)
Chloride: 104 mmol/L (ref 96–106)
Chol/HDL Ratio: 3.9 ratio (ref 0.0–4.4)
Cholesterol, Total: 221 mg/dL — ABNORMAL HIGH (ref 100–199)
Creatinine, Ser: 0.69 mg/dL (ref 0.57–1.00)
EOS (ABSOLUTE): 0.1 10*3/uL (ref 0.0–0.4)
Eos: 3 %
Estimated CHD Risk: 0.8 times avg. (ref 0.0–1.0)
Free Thyroxine Index: 2.4 (ref 1.2–4.9)
GGT: 22 IU/L (ref 0–60)
Globulin, Total: 2.4 g/dL (ref 1.5–4.5)
Glucose: 86 mg/dL (ref 65–99)
HDL: 56 mg/dL (ref >39)
Hematocrit: 41.6 % (ref 34.0–46.6)
Hemoglobin: 13.9 g/dL (ref 11.1–15.9)
Immature Grans (Abs): 0 10*3/uL (ref 0.0–0.1)
Immature Granulocytes: 1 %
Iron: 114 ug/dL (ref 27–159)
LDH: 204 IU/L (ref 119–226)
LDL Chol Calc (NIH): 141 mg/dL — ABNORMAL HIGH (ref 0–99)
Lymphocytes Absolute: 1.6 10*3/uL (ref 0.7–3.1)
Lymphs: 33 %
MCH: 30.8 pg (ref 26.6–33.0)
MCHC: 33.4 g/dL (ref 31.5–35.7)
MCV: 92 fL (ref 79–97)
Monocytes Absolute: 0.5 10*3/uL (ref 0.1–0.9)
Monocytes: 11 %
Neutrophils Absolute: 2.5 10*3/uL (ref 1.4–7.0)
Neutrophils: 51 %
Phosphorus: 3 mg/dL (ref 3.0–4.3)
Platelets: 204 10*3/uL (ref 150–450)
Potassium: 4.1 mmol/L (ref 3.5–5.2)
RBC: 4.52 x10E6/uL (ref 3.77–5.28)
RDW: 12.2 % (ref 11.7–15.4)
Sodium: 140 mmol/L (ref 134–144)
T3 Uptake Ratio: 26 % (ref 24–39)
T4, Total: 9.4 ug/dL (ref 4.5–12.0)
TSH: 0.84 u[IU]/mL (ref 0.450–4.500)
Total Protein: 6.9 g/dL (ref 6.0–8.5)
Triglycerides: 135 mg/dL (ref 0–149)
Uric Acid: 5.1 mg/dL (ref 3.0–7.2)
VLDL Cholesterol Cal: 24 mg/dL (ref 5–40)
WBC: 4.8 10*3/uL (ref 3.4–10.8)
eGFR: 101 mL/min/{1.73_m2} (ref >59)

## 2021-05-23 LAB — HGB A1C W/O EAG: Hgb A1c MFr Bld: 5.4 % (ref 4.8–5.6)

## 2021-05-23 NOTE — Addendum Note (Signed)
Addended by: Gerarda Fraction A on: 05/23/2021 03:34 PM   Modules accepted: Orders

## 2021-06-04 DIAGNOSIS — M79605 Pain in left leg: Secondary | ICD-10-CM | POA: Insufficient documentation

## 2021-06-30 ENCOUNTER — Other Ambulatory Visit: Payer: Self-pay | Admitting: Registered Nurse

## 2021-08-24 ENCOUNTER — Telehealth: Payer: Self-pay | Admitting: Registered Nurse

## 2021-08-24 DIAGNOSIS — R7989 Other specified abnormal findings of blood chemistry: Secondary | ICD-10-CM

## 2021-08-24 NOTE — Telephone Encounter (Signed)
Elevated calcium 05/22/2021 labs.  Saw PCM 06/09/21 and repeat labs. Vitamin D continued low 20.  Intact PTH elevated 109 and calcium worsening. Endocrinology referral placed by [facility] and appt 09/08/21  Sodium 139 137 138 139 140  Potassium 4.0 4.0 3.8 3.9 3.8  Chloride 107 107 107 108 104  CO2 22 Low  24 26 25  22.0  BUN 14 9 15 16 20   Glucose 99  96  98 94 94  Creatinine 0.70 0.72 0.69 0.76 0.79  Calcium 10.8 High  10.4 10.5 9.9 9.9  Total Protein 7.5  7.3  7.5 7.5 7.3  Albumin  4.8 4.6 4.4 4.5 4.4  Total Bilirubin 0.8  0.9  0.6 0.4 0.8  Alkaline Phosphatase 61 61 59 59 54  AST (SGOT) 22 24 21 23  35  ALT (SGPT) 12 11 11 15 14   Anion Gap 10 6 5 6 14   Est. GFR >90  -- -- -- --

## 2021-08-26 ENCOUNTER — Telehealth: Payer: Self-pay | Admitting: *Deleted

## 2021-08-26 NOTE — Telephone Encounter (Signed)
Called and spoke with the patient and rescheduled her appt from 11/9 to 11/2 with Dr Delsa Sale

## 2021-08-31 ENCOUNTER — Other Ambulatory Visit: Payer: Self-pay

## 2021-08-31 ENCOUNTER — Ambulatory Visit: Payer: Self-pay | Admitting: *Deleted

## 2021-08-31 DIAGNOSIS — R7989 Other specified abnormal findings of blood chemistry: Secondary | ICD-10-CM

## 2021-08-31 NOTE — Telephone Encounter (Signed)
RN Hildred Alamin notified me today patient missed her scheduled blood draw and had ran out of Vitamin D supplement 2 weeks ago.  Patient following up with PCM to find out what Vitamin D dose she should be taking.  Discussed with RN Hildred Alamin with endocrinology appt pending next week will hold labs until after that appt and patient restarting Vitamin D supplement.  RN Hildred Alamin will notify patient.

## 2021-08-31 NOTE — Progress Notes (Signed)
Appt for nonfasting labs, calcium and Vit D. Pt notified RN out of vit D for 2 weeks. Unsure what dose she was taking. Will check at home and let RN know tomorrow. Will discuss with NP tomorrow during clinic and likely reschedule labs for one month after restarting Vit D supplement.

## 2021-09-02 ENCOUNTER — Encounter: Payer: Self-pay | Admitting: Registered Nurse

## 2021-09-02 NOTE — Telephone Encounter (Signed)
Patient came to clinic and reported she was taking 50,000 units once a week by month on Mondays for Vitamin D.  She came to clinic to notify us and to also have labs drawn e.g. calcium and vitamin D.  Discussed with patient endocrinology may have further labs requested and we can draw these today but then she may require another draw at appt next week.  Patient decided to hold on [facility] labs until after endocrinology appt so all can be drawn at one time.  Patient agreed with plan of care and had no further questions at this time.

## 2021-09-11 NOTE — Telephone Encounter (Signed)
RN Hildred Alamin contacted endocrinology office and awaiting faxed Rx of orders to draw patient ordered labs for appt next week.

## 2021-09-15 ENCOUNTER — Encounter: Payer: Self-pay | Admitting: Obstetrics & Gynecology

## 2021-09-15 NOTE — Telephone Encounter (Signed)
RN Hildred Alamin stated no fax received but able to review orders in Epic and contacting patient for lab draw today or Thursday. PTH, calcium . . .  Vitamin D

## 2021-09-15 NOTE — Telephone Encounter (Signed)
Per Endocrinology--Please have them fax any PTH, Vitamin D, Metabolic Panels (including calcium and albumin). Thanks!

## 2021-09-16 ENCOUNTER — Other Ambulatory Visit: Payer: Self-pay

## 2021-09-16 ENCOUNTER — Inpatient Hospital Stay
Payer: No Typology Code available for payment source | Attending: Gynecologic Oncology | Admitting: Obstetrics & Gynecology

## 2021-09-16 VITALS — BP 152/86 | HR 105 | Temp 97.2°F | Resp 18 | Wt 271.4 lb

## 2021-09-16 DIAGNOSIS — C541 Malignant neoplasm of endometrium: Secondary | ICD-10-CM | POA: Insufficient documentation

## 2021-09-16 NOTE — Patient Instructions (Addendum)
Return in 1 year. Plan to call our office at [phone] after you see your GYN in six months to schedule your appointment for one year. Please call for any needs, concerns, or new symptoms.   Symptoms to report to your health care team include vaginal bleeding, rectal bleeding, bloating, weight loss without effort, new and persistent pain, new and  persistent fatigue, new leg swelling, new masses (i.e., bumps in your neck or groin), new and persistent cough, new and persistent nausea and vomiting, change in bowel or bladder habits, and any other concerns.

## 2021-09-16 NOTE — Progress Notes (Signed)
Follow Up Note: Gyn-Onc  Sara Cook 55 y.o. female  CC: F/U visit   HPI: The oncology history was reviewed.  Interval History: At the last visit she presented w/RLE swelling.   She was also treated for cellulitis at the umbilicus. She denies any vaginal bleeding, abdominal/pelvic pain, cough, lethargy or increasing abdominal girth.     Review of Systems  Review of Systems  Constitutional:  Negative for malaise/fatigue and weight loss.  Respiratory:  Negative for shortness of breath and wheezing.   Cardiovascular:  Negative for chest pain and leg swelling.  Gastrointestinal:  Negative for abdominal pain, blood in stool, constipation, nausea and vomiting.  Genitourinary:  Negative for dysuria, frequency, hematuria and urgency.  Musculoskeletal:  Negative for joint pain and myalgias.  Neurological:  Negative for weakness.  Psychiatric/Behavioral:  Negative for depression. The patient does not have insomnia.    Current medications, allergy, social history, past surgical history, past medical history, family history were all reviewed.   Vitals:  BP (!) 152/86   Pulse (!) 105   Temp (!) 97.2 F (36.2 C)   Resp 18   Wt 271 lb 6 oz (123.1 kg)   SpO2 100%   BMI 42.50 kg/m    Physical Exam:  Physical Exam Exam conducted with a chaperone present.  Constitutional:      General: She is not in acute distress. Cardiovascular:     Rate and Rhythm: Normal rate and regular rhythm.  Pulmonary:     Effort: Pulmonary effort is normal.     Breath sounds: Normal breath sounds. No wheezing or rhonchi.  Abdominal:     Palpations: Abdomen is soft.     Tenderness: There is no abdominal tenderness. There is no right CVA tenderness or left CVA tenderness.     Hernia: No hernia is present.  Genitourinary:    General: Normal vulva.     Urethra: No urethral lesion.     Vagina: No lesions. No vaginal bleeding.  Musculoskeletal:     Cervical back: Neck supple.     Right lower leg: No edema.      Left lower leg: No edema.  Lymphadenopathy:     Upper Body:     Right upper body: No supraclavicular adenopathy.     Left upper body: No supraclavicular adenopathy.     Lower Body: No right inguinal adenopathy. No left inguinal adenopathy.  Skin:    Findings: No rash.  Neurological:     Mental Status: She is oriented to person, place, and time.   Assessment/Plan: Endometrial cancer [facility]) [name]  is a 55 y.o.  year old P2 with stage IB grade 1 endometrial cancer, MMR normal/MSI stable. S/p staging on 03/17/21.       >continue follow-up every 6 months for 5 years in accordance with NCCN guidelines.     Lahoma Crocker, MD

## 2021-09-16 NOTE — Assessment & Plan Note (Signed)
Sara Cook  is a 55 y.o.  year old P2 with stage IB grade 1 endometrial cancer, MMR normal/MSI stable. S/p staging on 03/17/21.    >continue follow-up every 6 months for 5 years in accordance with NCCN guidelines.

## 2021-09-17 ENCOUNTER — Encounter: Payer: Self-pay | Admitting: Obstetrics & Gynecology

## 2021-09-17 ENCOUNTER — Ambulatory Visit: Payer: Self-pay | Admitting: *Deleted

## 2021-09-17 DIAGNOSIS — R7989 Other specified abnormal findings of blood chemistry: Secondary | ICD-10-CM

## 2021-09-17 NOTE — Progress Notes (Signed)
Nonfasting labs ahead of 09/18/21 endocrinology appt.

## 2021-09-18 NOTE — Telephone Encounter (Signed)
Patient had endocrinology consult today. Epic reviewed. Labs pending and follow up recommended in 3 months.    : -Labs: Renal function, magnesium, PTH, vitamin D -Medications: Continue vitamin D 125 mcg daily -Pending above work up, will be able to evaluate whether etiology is secondary or primary and provide further recommendations. -If consistent with secondary hyperparathyroidism, we discussed adjustment of vitamin D dosing maintain normal target vitamin D 30-50 -If consistent with primary hyperparathyroidism - will discuss with patient criteria for surgery is met and refer to experienced surgeon if indicated. If not candidate for surgery, will monitor labs every 6 months to assess for new indication for parathyroidectomy.   Return in about 3 months (around 12/19/2021) for primary hyperpara, Vit D insuff/def.   Electronically signed by: Mamie Laurel, DO 09/18/21 1111

## 2021-09-18 NOTE — Telephone Encounter (Signed)
My chart message sent to patient with lab results except PTH still pending and voicemail left for patient with results information in case she was not able to access my chart.  Patient instructed to call [phone] if further questions or concerns.  Results for LYNIX, BONINE (MRN [id number]) as of 09/18/2021 12:29  Ref. Range 09/17/2021 09:49  Sodium Latest Ref Range: 134 - 144 mmol/L 140  Potassium Latest Ref Range: 3.5 - 5.2 mmol/L 4.3  Chloride Latest Ref Range: 96 - 106 mmol/L 107 (H)  CO2 Latest Ref Range: 20 - 29 mmol/L 17 (L)  Glucose Latest Ref Range: 70 - 99 mg/dL 125 (H)  BUN Latest Ref Range: 6 - 24 mg/dL 17  Creatinine Latest Ref Range: 0.57 - 1.00 mg/dL 0.76  Calcium Latest Ref Range: 8.7 - 10.2 mg/dL 9.7  BUN/Creatinine Ratio Latest Ref Range: 9 - 23  22  eGFR Latest Ref Range: >59 mL/min/1.73 91  Alkaline Phosphatase Latest Ref Range: 44 - 121 IU/L 72  Albumin Latest Ref Range: 3.8 - 4.9 g/dL 4.3  Albumin/Globulin Ratio Latest Ref Range: 1.2 - 2.2  1.7  AST Latest Ref Range: 0 - 40 IU/L 21  ALT Latest Ref Range: 0 - 32 IU/L 10  Total Protein Latest Ref Range: 6.0 - 8.5 g/dL 6.8  Total Bilirubin Latest Ref Range: 0.0 - 1.2 mg/dL 0.6  Vitamin D, 25-Hydroxy Latest Ref Range: 30.0 - 100.0 ng/mL 34.6  Globulin, Total Latest Ref Range: 1.5 - 4.5 g/dL 2.5  PTH, Intact Unknown WILL FOLLOW

## 2021-09-19 LAB — COMPREHENSIVE METABOLIC PANEL
ALT: 10 IU/L (ref 0–32)
AST: 21 IU/L (ref 0–40)
Albumin/Globulin Ratio: 1.7 (ref 1.2–2.2)
Albumin: 4.3 g/dL (ref 3.8–4.9)
Alkaline Phosphatase: 72 IU/L (ref 44–121)
BUN/Creatinine Ratio: 22 (ref 9–23)
BUN: 17 mg/dL (ref 6–24)
Bilirubin Total: 0.6 mg/dL (ref 0.0–1.2)
CO2: 17 mmol/L — ABNORMAL LOW (ref 20–29)
Calcium: 9.7 mg/dL (ref 8.7–10.2)
Chloride: 107 mmol/L — ABNORMAL HIGH (ref 96–106)
Creatinine, Ser: 0.76 mg/dL (ref 0.57–1.00)
Globulin, Total: 2.5 g/dL (ref 1.5–4.5)
Glucose: 125 mg/dL — ABNORMAL HIGH (ref 70–99)
Potassium: 4.3 mmol/L (ref 3.5–5.2)
Sodium: 140 mmol/L (ref 134–144)
Total Protein: 6.8 g/dL (ref 6.0–8.5)
eGFR: 91 mL/min/{1.73_m2} (ref >59)

## 2021-09-19 LAB — VITAMIN D 25 HYDROXY (VIT D DEFICIENCY, FRACTURES): Vit D, 25-Hydroxy: 34.6 ng/mL (ref 30.0–100.0)

## 2021-09-19 LAB — PARATHYROID HORMONE, INTACT (NO CA): PTH: 57 pg/mL (ref 15–65)

## 2021-09-21 NOTE — Progress Notes (Signed)
Per Care Everywhere review, pt seen by endocrinology 11/4. Labs redrawn same day and awaiting evaluation/pt to be contacted back to determine if primary or secondary hyperparathyroidism and plan of care.

## 2021-09-21 NOTE — Progress Notes (Signed)
Noted patient had labs at endocrinology 09/18/21

## 2021-09-23 ENCOUNTER — Ambulatory Visit: Payer: PRIVATE HEALTH INSURANCE | Admitting: Gynecologic Oncology

## 2021-10-16 NOTE — Telephone Encounter (Signed)
Next appt 11/26/2021 per Epic with endocrinology

## 2021-10-27 ENCOUNTER — Ambulatory Visit: Payer: Self-pay | Admitting: Registered Nurse

## 2021-10-27 ENCOUNTER — Encounter: Payer: Self-pay | Admitting: Registered Nurse

## 2021-10-27 ENCOUNTER — Other Ambulatory Visit: Payer: Self-pay

## 2021-10-27 ENCOUNTER — Telehealth: Payer: Self-pay | Admitting: Registered Nurse

## 2021-10-27 DIAGNOSIS — M791 Myalgia, unspecified site: Secondary | ICD-10-CM

## 2021-10-27 DIAGNOSIS — J029 Acute pharyngitis, unspecified: Secondary | ICD-10-CM

## 2021-10-27 DIAGNOSIS — U071 COVID-19: Secondary | ICD-10-CM

## 2021-10-27 MED ORDER — IBUPROFEN 800 MG PO TABS
800.0000 mg | ORAL_TABLET | Freq: Three times a day (TID) | ORAL | 0 refills | Status: DC | PRN
Start: 2021-10-27 — End: 2022-03-04

## 2021-10-27 MED ORDER — AMOXICILLIN 875 MG PO TABS: 875.0000 mg | ORAL_TABLET | Freq: Two times a day (BID) | ORAL | 0 refills | Status: AC

## 2021-10-27 NOTE — Patient Instructions (Signed)
Pharyngitis °Pharyngitis is inflammation of the throat (pharynx). It is a very common cause of sore throat. Pharyngitis can be caused by a bacteria, but it is usually caused by a virus. Most cases of pharyngitis get better on their own without treatment. °What are the causes? °This condition may be caused by: °Infection by viruses (viral). Viral pharyngitis spreads easily from person to person (is contagious) through coughing, sneezing, and sharing of personal items or utensils such as cups, forks, spoons, and toothbrushes. °Infection by bacteria (bacterial). Bacterial pharyngitis may be spread by touching the nose or face after coming in contact with the bacteria, or through close contact, such as kissing. °Allergies. Allergies can cause buildup of mucus in the throat (post-nasal drip), leading to inflammation and irritation. Allergies can also cause blocked nasal passages, forcing breathing through the mouth, which dries and irritates the throat. °What increases the risk? °You are more likely to develop this condition if: °You are 5-24 years old. °You are exposed to crowded environments such as daycare, school, or dormitory living. °You live in a cold climate. °You have a weakened disease-fighting (immune) system. °What are the signs or symptoms? °Symptoms of this condition vary by the cause. Common symptoms of this condition include: °Sore throat. °Fatigue. °Low-grade fever. °Stuffy nose (nasal congestion) and cough. °Headache. °Other symptoms may include: °Glands in the neck (lymph nodes) that are swollen. °Skin rashes. °Plaque-like film on the throat or tonsils. This is often a symptom of bacterial pharyngitis. °Vomiting. °Red, itchy eyes (conjunctivitis). °Loss of appetite. °Joint pain and muscle aches. °Enlarged tonsils. °How is this diagnosed? °This condition may be diagnosed based on your medical history and a physical exam. Your health care provider will ask you questions about your illness and your  symptoms. °A swab of your throat may be done to check for bacteria (rapid strep test). Other lab tests may also be done, depending on the suspected cause, but these are rare. °How is this treated? °Many times, treatment is not needed for this condition. Pharyngitis usually gets better in 3-4 days without treatment. °Bacterial pharyngitis may be treated with antibiotic medicines. °Follow these instructions at home: °Medicines °Take over-the-counter and prescription medicines only as told by your health care provider. °If you were prescribed an antibiotic medicine, take it as told by your health care provider. Do not stop taking the antibiotic even if you start to feel better. °Use throat sprays to soothe your throat as told by your health care provider. °Children can get pharyngitis. Do not give your child aspirin because of the association with Reye's syndrome. °Managing pain °To help with pain, try: °Sipping warm liquids, such as broth, herbal tea, or warm water. °Eating or drinking cold or frozen liquids, such as frozen ice pops. °Gargling with a mixture of salt and water 3-4 times a day or as needed. To make salt water, completely dissolve ½-1 tsp (3-6 g) of salt in 1 cup (237 mL) of warm water. °Sucking on hard candy or throat lozenges. °Putting a cool-mist humidifier in your bedroom at night to moisten the air. °Sitting in the bathroom with the door closed for 5-10 minutes while you run hot water in the shower. ° °General instructions ° °Do not use any products that contain nicotine or tobacco. These products include cigarettes, chewing tobacco, and vaping devices, such as e-cigarettes. If you need help quitting, ask your health care provider. °Rest as told by your health care provider. °Drink enough fluid to keep your urine pale yellow. °How   is this prevented? To help prevent becoming infected or spreading infection: Wash your hands often with soap and water for at least 20 seconds. If soap and water are not  available, use hand sanitizer. Do not touch your eyes, nose, or mouth with unwashed hands, and wash hands after touching these areas. Do not share cups or eating utensils. Avoid close contact with people who are sick. Contact a health care provider if: You have large, tender lumps in your neck. You have a rash. You cough up green, yellow-brown, or bloody mucus. Get help right away if: Your neck becomes stiff. You drool or are unable to swallow liquids. You cannot drink or take medicines without vomiting. You have severe pain that does not go away, even after you take medicine. You have trouble breathing, and it is not caused by a stuffy nose. You have new pain and swelling in your joints such as the knees, ankles, wrists, or elbows. These symptoms may represent a serious problem that is an emergency. Do not wait to see if the symptoms will go away. Get medical help right away. Call your local emergency services (911 in the [place] Do not drive yourself to the hospital. Summary Pharyngitis is redness, pain, and swelling (inflammation) of the throat (pharynx). While pharyngitis can be caused by a bacteria, the most common causes are viral. Most cases of pharyngitis get better on their own without treatment. Bacterial pharyngitis is treated with antibiotic medicines. This information is not intended to replace advice given to you by your health care provider. Make sure you discuss any questions you have with your health care provider. Document Revised: 01/28/2021 Document Reviewed: 01/28/2021 Elsevier Patient Education  [place]. Strep Throat, Adult Strep throat is an infection in the throat that is caused by bacteria. It is common during the cold months of the year. It mostly affects children who are 62-76 years old. However, people of all ages can get it at any time of the year. This infection spreads from person to person (is contagious) through coughing, sneezing, or having close  contact. Your health care provider may use other names to describe the infection. When strep throat affects the tonsils, it is called tonsillitis. When it affects the back of the throat, it is called pharyngitis. What are the causes? This condition is caused by the Streptococcus pyogenes bacteria. What increases the risk? You are more likely to develop this condition if: You care for school-age children, or are around school-age children. Children are more likely to get strep throat and may spread it to others. You spend time in crowded places where the infection can spread easily. You have close contact with someone who has strep throat. What are the signs or symptoms? Symptoms of this condition include: Fever or chills. Redness, swelling, or pain in the tonsils or throat. Pain or difficulty when swallowing. White or yellow spots on the tonsils or throat. Tender glands in the neck and under the jaw. Bad smelling breath. Red rash all over the body. This is rare. How is this diagnosed? This condition is diagnosed by tests that check for the presence and the amount of bacteria that cause strep throat. They are: Rapid strep test. Your throat is swabbed and checked for the presence of bacteria. Results are usually ready in minutes. Throat culture test. Your throat is swabbed. The sample is placed in a cup that allows infections to grow. Results are usually ready in 1 or 2 days. How is this treated?  This condition may be treated with: Medicines that kill germs (antibiotics). Medicines that relieve pain or fever. These include: Ibuprofen or acetaminophen. Aspirin, only for people who are over the age of 50. Throat lozenges. Throat sprays. Follow these instructions at home: Medicines  Take over-the-counter and prescription medicines only as told by your health care provider. Take your antibiotic medicine as told by your health care provider. Do not stop taking the antibiotic even if you  start to feel better. Eating and drinking  If you have trouble swallowing, try eating soft foods until your sore throat feels better. Drink enough fluid to keep your urine pale yellow. To help relieve pain, you may have: Warm fluids, such as soup and tea. Cold fluids, such as frozen desserts or popsicles. General instructions Gargle with a salt-water mixture 3-4 times a day or as needed. To make a salt-water mixture, completely dissolve -1 tsp (3-6 g) of salt in 1 cup (237 mL) of warm water. Get plenty of rest. Stay home from work or school until you have been taking antibiotics for 24 hours. Do not use any products that contain nicotine or tobacco. These products include cigarettes, chewing tobacco, and vaping devices, such as e-cigarettes. If you need help quitting, ask your health care provider. It is up to you to get your test results. Ask your health care provider, or the department that is doing the test, when your results will be ready. Keep all follow-up visits. This is important. How is this prevented?  Do not share food, drinking cups, or personal items that could cause the infection to spread to other people. Wash your hands often with soap and water for at least 20 seconds. If soap and water are not available, use hand sanitizer. Make sure that all people in your house wash their hands well. Have family members tested if they have a sore throat or fever. They may need an antibiotic if they have strep throat. Contact a health care provider if: You have swelling in your neck that keeps getting bigger. You develop a rash, cough, or earache. You cough up a thick mucus that is green, yellow-brown, or bloody. You have pain or discomfort that does not get better with medicine. Your symptoms seem to be getting worse. You have a fever. Get help right away if: You have new symptoms, such as vomiting, severe headache, stiff or painful neck, chest pain, or shortness of breath. You have  severe throat pain, drooling, or changes in your voice. You have swelling of the neck, or the skin on the neck becomes red and tender. You have signs of dehydration, such as tiredness (fatigue), dry mouth, and decreased urination. You become increasingly sleepy, or you cannot wake up completely. Your joints become red or painful. These symptoms may represent a serious problem that is an emergency. Do not wait to see if the symptoms will go away. Get medical help right away. Call your local emergency services (911 in the [place] Do not drive yourself to the hospital. Summary Strep throat is an infection in the throat that is caused by the Streptococcus pyogenes bacteria. This infection is spread from person to person (is contagious) through coughing, sneezing, or having close contact. Take your medicines, including antibiotics, as told by your health care provider. Do not stop taking the antibiotic even if you start to feel better. To prevent the spread of germs, wash your hands well with soap and water. Have others do the same. Do not share food,  drinking cups, or personal items. Get help right away if you have new symptoms, such as vomiting, severe headache, stiff or painful neck, chest pain, or shortness of breath. This information is not intended to replace advice given to you by your health care provider. Make sure you discuss any questions you have with your health care provider. Document Revised: 02/24/2021 Document Reviewed: 02/24/2021 Elsevier Patient Education  2022 [employer].

## 2021-10-27 NOTE — Progress Notes (Signed)
Subjective:    Patient ID: Sara Cook, female    DOB: Jul 25, 1966, 55 y.o.   MRN: [id number]  Virtual Visit via Telephone Note  I connected withDragojla Cook on 10/27/21 at  9:40 AM EST by telephoneand verified that I am speaking with the correct person using two identifiers.  Location: Patient: home Provider: [facility]  I discussed the limitations, risks, security and privacy concerns of performing an evaluation and management service by telephone. The patient expressed understanding and agreed to proceed.   History of Present Illness:  55y/o caucasian female established patient requested virtual appt.  Called out sick this am.  Having chills and sore throat/body aches.  Requested refill ibuprofen 800mg  po TID prn pain/fever from [facility] today also.  Patient's sister Sara Cook will take medicine to patient home.  Patient denied others sick at home.     Observations/Objective:  Patient reported pain with swallowing, white spots in back of throat, chills and body aches.  Denied trouble breathing/vomiting/diarrhea/wheezing/shortness of breath or fever.   Assessment and Plan:  work excuse note given to patient for 24 hours. Usually no specific medical treatment is needed if a virus is causing the sore throat. The throat most often gets better on its own within 5 to 7 days. Antibiotic medicine does not cure viral pharyngitis.  -For acute pharyngitis caused by bacteria, your healthcare provider will prescribe an antibiotic.  -Work excusal for 24 hours as considered infectious until 24 hours of antibiotics on board cleared to start work tomorrow afternoon/evening-take 875mg  amoxicillin every 12 hours x 10 days #20 RF0 dispensed from [facility] to sister -Motrin/Ibuprofen 800mg  by mouth every 8 hours with food #30 RF0 dispensed from [facility] to sister Sara who will deliver to patient -ER/call 911 if drooling, unable to swallow, trouble breathing discussed tonsillar abscess  symptoms/hot potato voice. -Do not smoke.  -Avoid secondhand smoke and other air pollutants.  -Use a cool mist humidifier to add moisture to the air.  -Get plenty of rest; sleep 7-8 hours per night -You may want to rest your throat by talking less and eating a diet that is mostly liquid or soft for a day or two.  -Nonprescription throat lozenges and mouthwashes should help relieve the soreness.  -Gargling with warm saltwater and drinking warm liquids may help. (You can make a saltwater solution by adding 1/4 teaspoon of salt to 8 ounces, or 240 mL, of warm water.)  -Change your toothbrush on your last day of antibiotic -Avoid oral intimate contact until antibiotics finished -Wash dishes/silverware/glasses in hot water or diswasher -Do not share glasses/silverware/dishes during meals that touch your lips/mouth -Usually no specific medical treatment is needed if a virus is causing the sore throat. The throat most often gets better on its own within 5 to 7 days. Antibiotic medicine does not cure viral pharyngitis. DDx mononucleosis, strep throat, hand foot and mouth, post nasal drip irritation pharynx/throat -Exitcare handout on pharyngitis acute and strep throat given to patient FOLLOW UP with clinic provider if no improvements in the next 7-10 days. Patient verbalized understanding of instructions and agreed with plan of care.  P2: Hand washing and diet.      Follow Up Instructions:   I discussed the assessment and treatment plan with the patient. The patient was provided an opportunity to ask questions and all were answered. The patient agreed with the plan and demonstrated an understanding of the instructions.  The patient was advised to call back or seek an in-person evaluation  if the symptoms worsen or if the condition fails to improve as anticipated.  [email or web address] or [phone] NP work from home number or [place] [id number] M, Tu, Th or Fr  I provided 15 minutes of  non-face-to-face time during this encounter.   Olen Cordial, NP       Review of Systems  Constitutional:  Positive for chills and fatigue. Negative for activity change, appetite change and fever.  HENT:  Positive for sore throat. Negative for trouble swallowing and voice change.   Eyes:  Negative for photophobia and visual disturbance.  Respiratory:  Negative for cough, shortness of breath, wheezing and stridor.   Cardiovascular:  Negative for chest pain.  Gastrointestinal:  Negative for diarrhea, nausea and vomiting.  Endocrine: Negative for cold intolerance and heat intolerance.  Genitourinary:  Negative for difficulty urinating.  Musculoskeletal:  Positive for myalgias. Negative for gait problem.  Allergic/Immunologic: Negative for food allergies.  Neurological:  Negative for dizziness, tremors, seizures, syncope, facial asymmetry, speech difficulty, weakness, light-headedness, numbness and headaches.  Hematological:  Negative for adenopathy. Does not bruise/bleed easily.  Psychiatric/Behavioral:  Negative for agitation, confusion and sleep disturbance.       Objective:   Physical Exam Constitutional:      General: She is not in acute distress. Pulmonary:     Effort: Pulmonary effort is normal.     Breath sounds: Normal breath sounds.  Neurological:     Mental Status: She is alert and oriented to person, place, and time.  Psychiatric:        Attention and Perception: Attention and perception normal.        Mood and Affect: Mood normal.        Speech: Speech normal.        Behavior: Behavior normal. Behavior is cooperative.        Thought Content: Thought content normal.        Cognition and Memory: Cognition and memory normal.        Judgment: Judgment normal.         Assessment & Plan:

## 2021-10-28 MED ORDER — MOLNUPIRAVIR EUA 200MG CAPSULE: 4.0000 | ORAL_CAPSULE | Freq: Two times a day (BID) | ORAL | 0 refills | Status: AC

## 2021-10-28 NOTE — Telephone Encounter (Signed)
Patient contacted via telephone and Covid test positive 12/14 line pink Day 0;  Day 5 12/19 Day 6 estimated RTW 12/20; Day 10 12/24.  Patient reported throat a little better with motrin and amoxicillin but still not feeling well enough to work.  Discussed with patient her covid test as she reported took two this morning because 1 line red and 1 line pink.  Discussed with patient this is a positive test.  Patient did want covid antivirals.  Emergency use authorization handout patient sent to her my chart along with home care and quarantine information.  HR notified estimated RTW 12/20 with strict mask use through 12/24.  Patient A&Ox3 spoke full sentences without difficulty.  Nasal congestion noted.  No cough or throat clearing audible during 8 minute telephone call.  She stated sister did not come in her house yesterday and denied close contacts at work on Monday only passing by in hallway not standing to talk with anyone.  Will re-evaluate patient tomorrow.  Stated tolerating po intake without difficulty and urinating normally.  Patient verbalized understanding information/instructions, agreed with plan of care and had no further questions at this time.

## 2021-10-31 MED ORDER — PROMETHAZINE HCL 6.25 MG/5ML PO SYRP: 6.2500 mg | ORAL_SOLUTION | Freq: Four times a day (QID) | ORAL | 0 refills | Status: DC | PRN

## 2021-10-31 NOTE — Telephone Encounter (Signed)
Patient contacted via telephone.  Patient denied fever yesterday.  Last fever on Thursday morning "I felt very bad".  Drank hot tea and lemon and fever broke.  Patient reported still coughing.  Sore throat/ears/headache resolved.  "Feeling very good today"  Patient has nyquil and dayquil for cough.  One episode last night of coughing until she threw up.  Denied wheezing/shortness of breath/diarrhea.  Electronic Rx sent to patient pharmacy of choice promethazine 6.25mg /69ml take 60ml po q6h prn cough/n/v #120 RF0.  Discussed with patient can cause drowsiness.  RN Hildred Alamin will call her Monday for re-evaluation prior to RTW 12/20 Day 6.  Patient may call me at [phone] if questions or concerns tomorrow.  Patient A&Ox3 spoke full sentences without difficulty; intermittent dry cough noted during 7 minute call.  No audible throat clearing/wheezing/shortness of breath.  HR notified re-evaluation 11/02/21 estimated RTW 12/20.  Patient verbalized understanding information/instructions, agreed with plan of care and had no further questions at this time.

## 2021-11-02 MED ORDER — ALBUTEROL SULFATE HFA 108 (90 BASE) MCG/ACT IN AERS: 2.0000 | INHALATION_SPRAY | Freq: Four times a day (QID) | RESPIRATORY_TRACT | 0 refills | Status: DC | PRN

## 2021-11-02 NOTE — Telephone Encounter (Signed)
Electronic Rx signed for albuterol MDI prn use and sent to patient pharmacy.  Reviewed RN Hildred Alamin note and agreed with plan of care.

## 2021-11-02 NOTE — Telephone Encounter (Signed)
Spoke with pt by phone. She reports being concerned about returning to work tomorrow due to cough. Harsh cough heard during phone call. She reports it feels like mucus is stuck when she is coughing. Still taking Dayquil/Nyquil. Did not pick up promethazine yet. Also sent in albuterol inh due to pt reporting coughing fits/unable to break cough until it progresses to vomiting. Will keep out of work additional day for medication start. Re-eval 12/20.

## 2021-11-03 NOTE — Telephone Encounter (Signed)
Spoke with pt by phone. Slept all night after taking promethazine. Also started albuterol inh. Willing to try to work tomorrow. Somewhat concerned still with fatigue and cough but advised to speak with supervisor about leaving early if unable to complete full shift. Plan to use Robitussin or Delsym for cough during the day, along with inhaler, and then promethazine only at night as it does make her drowsy. Continue strict mask use thru 12/24. Pt verbalizes understanding and agreement. Denies further needs or concerns.

## 2021-11-03 NOTE — Telephone Encounter (Signed)
Reviewed RN Haley note agreed with plan of care. 

## 2021-11-04 NOTE — Telephone Encounter (Signed)
Patient contacted via telephone stated was able to work today without difficulty.  Cough a little worse at work but taking dayquil and it is helping.  Patient requested 11am appt with me tomorrow scheduled.  Discussed if needs assessment earlier to see RN Hildred Alamin after 0900.  Patient A&Ox3 spoke full sentences without difficulty no cough/congestion/throat clearing audible during 3 minute telephone call.  HR notified patient returned to work today as expected continue strict mask wear and no eating in employee lunch room.  Patient verbalized understanding information/instructions, agreed with plan of care and had no further questions at this time.

## 2021-11-05 ENCOUNTER — Encounter: Payer: Self-pay | Admitting: Registered Nurse

## 2021-11-05 ENCOUNTER — Other Ambulatory Visit: Payer: Self-pay

## 2021-11-05 ENCOUNTER — Ambulatory Visit: Payer: Self-pay | Admitting: Registered Nurse

## 2021-11-05 VITALS — BP 119/90 | HR 87 | Temp 98.7°F

## 2021-11-05 DIAGNOSIS — J069 Acute upper respiratory infection, unspecified: Secondary | ICD-10-CM

## 2021-11-05 NOTE — Progress Notes (Signed)
Subjective:    Patient ID: Sara Cook, female    DOB: 1966-09-06, 55 y.o.   MRN: [id number]  55y/o Caucasian established female pt c/o persistent cough after Covid, positive test 12/14. Has been using promethazine syrup at night and Dayquil during the day. Switched back to Nyquil last night as it was improving some and she doesn't wake up so tired as promethazine.  Patient reported worked all day without difficulty except noted coughing more when at work. Has albuterol inhaler using prn.  Sometimes notes some wheezing that is intermittent.   Feels much better than last week at home.  Throat feels normal again.  Still cannot tolerate much meat or high fiber diet again yet.  Some nausea if she eats too many vegetables or meat. Denied fever/chills/headache/dizziness/vomiting/diarrhea.     Review of Systems  Constitutional:  Positive for appetite change and fatigue. Negative for activity change, chills, diaphoresis and fever.  HENT:  Negative for congestion, nosebleeds, postnasal drip, rhinorrhea, sinus pressure, sinus pain, sneezing, sore throat, tinnitus, trouble swallowing and voice change.   Eyes:  Negative for photophobia and visual disturbance.  Respiratory:  Positive for cough, chest tightness and wheezing. Negative for choking, shortness of breath and stridor.   Cardiovascular:  Negative for chest pain and palpitations.  Gastrointestinal:  Positive for nausea. Negative for abdominal distention, abdominal pain, diarrhea and vomiting.  Endocrine: Negative for cold intolerance and heat intolerance.  Genitourinary:  Negative for difficulty urinating.  Musculoskeletal:  Negative for back pain, gait problem, myalgias, neck pain and neck stiffness.  Skin:  Negative for color change, rash and wound.  Allergic/Immunologic: Negative for food allergies.  Neurological:  Negative for dizziness, tremors, seizures, syncope, facial asymmetry, speech difficulty, weakness, light-headedness, numbness  and headaches.  Hematological:  Negative for adenopathy. Does not bruise/bleed easily.  Psychiatric/Behavioral:  Negative for agitation, confusion and sleep disturbance.       Objective:   Physical Exam Vitals and nursing note reviewed.  Constitutional:      General: She is awake. She is not in acute distress.    Appearance: Normal appearance. She is well-developed and well-groomed. She is obese. She is not ill-appearing, toxic-appearing or diaphoretic.  HENT:     Head: Normocephalic and atraumatic.     Jaw: There is normal jaw occlusion. No trismus or pain on movement.     Salivary Glands: Right salivary gland is not diffusely enlarged or tender. Left salivary gland is not diffusely enlarged or tender.     Right Ear: Hearing, ear canal and external ear normal. A middle ear effusion is present.     Left Ear: Hearing, ear canal and external ear normal. A middle ear effusion is present.     Nose: Mucosal edema, congestion and rhinorrhea present. No nasal deformity, septal deviation or laceration. Rhinorrhea is clear.     Right Turbinates: Enlarged and swollen. Not pale.     Left Turbinates: Enlarged and swollen. Not pale.     Right Sinus: No maxillary sinus tenderness or frontal sinus tenderness.     Left Sinus: No maxillary sinus tenderness or frontal sinus tenderness.     Mouth/Throat:     Lips: Pink. No lesions.     Mouth: Mucous membranes are moist. Mucous membranes are not pale, not dry and not cyanotic.     Comments: Bilateral TMs air fluid level clear; bilateral allergic shiners; lower eyelid edema nonpitting 1+/4 Eyes:     General: Lids are normal. Vision grossly intact. Gaze aligned appropriately.  Allergic shiner present. No scleral icterus.       Right eye: No foreign body, discharge or hordeolum.        Left eye: No foreign body, discharge or hordeolum.     Extraocular Movements: Extraocular movements intact.     Right eye: Normal extraocular motion and no nystagmus.     Left  eye: Normal extraocular motion and no nystagmus.     Conjunctiva/sclera: Conjunctivae normal.     Right eye: Right conjunctiva is not injected. No chemosis, exudate or hemorrhage.    Left eye: Left conjunctiva is not injected. No chemosis, exudate or hemorrhage.    Pupils: Pupils are equal, round, and reactive to light. Pupils are equal.     Right eye: Pupil is round and reactive.     Left eye: Pupil is round and reactive.  Neck:     Thyroid: No thyroid mass or thyromegaly.     Trachea: Trachea and phonation normal. No tracheal tenderness or tracheal deviation.  Cardiovascular:     Rate and Rhythm: Normal rate and regular rhythm.     Pulses: Normal pulses.          Radial pulses are 2+ on the right side and 2+ on the left side.     Heart sounds: Normal heart sounds, S1 normal and S2 normal. Heart sounds not distant. No murmur heard.   No friction rub. No gallop.  Pulmonary:     Effort: Pulmonary effort is normal. No accessory muscle usage or respiratory distress.     Breath sounds: Normal breath sounds and air entry. No stridor, decreased air movement or transmitted upper airway sounds. No decreased breath sounds, wheezing, rhonchi or rales.     Comments: Wearing surgical mask; no cough observed in clinic; spoke full sentences without difficulty Chest:     Chest wall: No tenderness.  Abdominal:     General: There is no distension.     Palpations: Abdomen is soft.  Musculoskeletal:        General: No swelling, tenderness, deformity or signs of injury. Normal range of motion.     Right elbow: No swelling, deformity, effusion or lacerations. Normal range of motion.     Left elbow: No swelling, deformity, effusion or lacerations. Normal range of motion.     Right hand: Normal. No swelling, deformity or lacerations. Normal strength. Normal capillary refill.     Left hand: Normal. No swelling, deformity or lacerations. Normal strength. Normal capillary refill.     Cervical back: Normal range  of motion and neck supple. No swelling, edema, deformity, erythema, signs of trauma, lacerations, rigidity, torticollis or crepitus. No pain with movement or muscular tenderness. Normal range of motion.     Thoracic back: No swelling, edema, deformity, signs of trauma or lacerations.     Right hip: Normal.     Left hip: Normal.     Right knee: Normal.     Left knee: Normal.     Right lower leg: No edema.     Left lower leg: No edema.  Lymphadenopathy:     Head:     Right side of head: No submental, submandibular, tonsillar, preauricular, posterior auricular or occipital adenopathy.     Left side of head: No submental, submandibular, tonsillar, preauricular, posterior auricular or occipital adenopathy.     Cervical: No cervical adenopathy.     Right cervical: No superficial, deep or posterior cervical adenopathy.    Left cervical: No superficial, deep or posterior cervical adenopathy.  Skin:    General: Skin is warm and dry.     Capillary Refill: Capillary refill takes less than 2 seconds.     Coloration: Skin is not ashen, cyanotic, jaundiced, mottled, pale or sallow.     Findings: No abrasion, abscess, acne, bruising, burn, ecchymosis, erythema, signs of injury, laceration, lesion, petechiae, rash or wound.     Nails: There is no clubbing.  Neurological:     General: No focal deficit present.     Mental Status: She is alert and oriented to person, place, and time. Mental status is at baseline. She is not disoriented.     GCS: GCS eye subscore is 4. GCS verbal subscore is 5. GCS motor subscore is 6.     Cranial Nerves: Cranial nerves 2-12 are intact. No cranial nerve deficit, dysarthria or facial asymmetry.     Sensory: Sensation is intact. No sensory deficit.     Motor: Motor function is intact. No weakness, tremor, atrophy, abnormal muscle tone or seizure activity.     Coordination: Coordination is intact. Coordination normal.     Gait: Gait is intact. Gait normal.     Comments:  In/out of chair without difficulty; gait sure and steady in clinic; bilateral hand grasp equal 5/5  Psychiatric:        Attention and Perception: Attention and perception normal.        Mood and Affect: Mood and affect normal.        Speech: Speech normal.        Behavior: Behavior normal. Behavior is cooperative.        Thought Content: Thought content normal.        Cognition and Memory: Cognition and memory normal.        Judgment: Judgment normal.          Assessment & Plan:   A-viral URI with cough  P-Day 8 post covid positive test still strict mask wear at work and no eating in employee lunch room through 12/24.  Symptoms improving.  Discussed with patient due to cold front, HVAC running and viral URI bronchioles irritated and may need to still use albuterol on regular schedule until cold front passes and warmer weather again next week.  Has promethazine liquid, dayquil, nyquil and cough drops for prn cough use also.  Discussed honey 1 tablespoon every 4 hours can also be helpful to prevent cough.  Continue hydrating and rest this weekend as off work Saturday and Sunday (holiday work closed).  Patient to notify me if coughing and throwing up, wheezing persistent or mucous cloudy and tan/pink/bloody or symptoms worsening again.  Discussed with patient not unusual to have mild cough with covid for longer than 2 weeks especially if still having any congestion/post nasal drip.  Discussed with patient sp02 good today, BBS CTA no wheezing or fluid heard in lungs.  Exitcare handout printed and given to patient viral URI and cough.  Patient verbalized understanding information/instructions, agreed with plan of care and had no further questions at this time.

## 2021-11-05 NOTE — Telephone Encounter (Signed)
Patient seen in clinic today Day 8  BBS CTA spo2 99-100% RA while speaking no congestion/throat clearing.  Will follow up this weekend to ensure continued improvement with plan of care via telephone.

## 2021-11-05 NOTE — Patient Instructions (Signed)

## 2021-11-10 NOTE — Telephone Encounter (Signed)
Patient seen in department A&Ox3 spoke full sentences without difficulty respirations even and unlabored wearing surgical mask; no cough/congestion or throat clearing audible during 5 minute conversation.  Patient reported post nasal drip has increased again and some sore throat.  Using otc cough and cold medication.  Denied fever/chills/shortness of breath/wheezing/vomiting after cough.  Phlegm clear.  Encouraged continued use nasal saline/flonase/dayquil/nyquil.  Follow up for re-evaluation new or worsening symptoms.  Patient verbalized understanding information/instructions, agreed with plan of care and had no further questions at this time.

## 2021-11-25 NOTE — Telephone Encounter (Signed)
Spoke with patient in workcenter feeling well denied concerns.  A&Ox3 spoke full sentences without difficulty; no cough/congestion/throat clearing audible during 5 minute conversation.  Respirations even and unlabored skin warm dry and pink gait sure and steady.  Encounter closed.

## 2021-11-26 ENCOUNTER — Telehealth: Payer: Self-pay | Admitting: Registered Nurse

## 2021-11-26 DIAGNOSIS — R7989 Other specified abnormal findings of blood chemistry: Secondary | ICD-10-CM

## 2021-11-27 ENCOUNTER — Encounter: Payer: Self-pay | Admitting: Registered Nurse

## 2021-11-27 NOTE — Telephone Encounter (Signed)
Related to 25(OH) Vitamin D Total Component 11/26/21 09/18/21 06/09/21  Total Vitamin D 25-Hydroxy 23 Low   24 Low   20 Low    Patient reported she had labs with endocrinology and Vitamin D level still low.  Discussed with patient less sun during winter months and earth tilted sun further away and we are wearing more clothes covering skin.  Important to take her vitamin D especially in the winter and repeat labs in 3 months or per endocrinology.  Take vitamin D with her fattiest meal to increase absorption.  Patient verbalized understanding information/instructions, agreed with plan of care and had no further questions at this time.    Reviewed Epic 11/27/2021 endocrinology note patient to continue 50,000 units weekly po cholecalciferol and add 10,000 IU cholecalciferol daily and repeat labs in 2 months along with office visit with endocrinology.  Will discuss with patient on 11/30/21 when onsite with patient at work.

## 2021-11-30 NOTE — Telephone Encounter (Signed)
Patient seen in workcenter today discussed Vitamin D dosing.  Patient stated she picked up new Rx from the pharmacy and has started it.  Denied questions or concerns at this time.

## 2021-12-02 ENCOUNTER — Other Ambulatory Visit: Payer: Self-pay | Admitting: Internal Medicine

## 2021-12-02 DIAGNOSIS — Z1231 Encounter for screening mammogram for malignant neoplasm of breast: Secondary | ICD-10-CM

## 2021-12-08 ENCOUNTER — Other Ambulatory Visit: Payer: Self-pay

## 2021-12-08 ENCOUNTER — Ambulatory Visit
Admission: RE | Admit: 2021-12-08 | Discharge: 2021-12-08 | Disposition: A | Discharge status: Home / Self Care | Payer: No Typology Code available for payment source | Source: Ambulatory Visit | Attending: Internal Medicine | Admitting: Internal Medicine

## 2021-12-08 DIAGNOSIS — Z1231 Encounter for screening mammogram for malignant neoplasm of breast: Secondary | ICD-10-CM

## 2022-03-03 ENCOUNTER — Emergency Department (HOSPITAL_COMMUNITY): Payer: No Typology Code available for payment source

## 2022-03-03 ENCOUNTER — Other Ambulatory Visit: Payer: Self-pay

## 2022-03-03 ENCOUNTER — Inpatient Hospital Stay (HOSPITAL_COMMUNITY)
Admission: EM | Admit: 2022-03-03 | Discharge: 2022-03-05 | DRG: 389 | Disposition: A | Discharge status: Home / Self Care | Payer: No Typology Code available for payment source | Attending: Internal Medicine | Admitting: Internal Medicine

## 2022-03-03 ENCOUNTER — Encounter (HOSPITAL_COMMUNITY): Payer: Self-pay

## 2022-03-03 DIAGNOSIS — Z823 Family history of stroke: Secondary | ICD-10-CM

## 2022-03-03 DIAGNOSIS — E210 Primary hyperparathyroidism: Secondary | ICD-10-CM | POA: Diagnosis present

## 2022-03-03 DIAGNOSIS — K565 Intestinal adhesions [bands], unspecified as to partial versus complete obstruction: Principal | ICD-10-CM | POA: Diagnosis present

## 2022-03-03 DIAGNOSIS — E669 Obesity, unspecified: Secondary | ICD-10-CM | POA: Diagnosis present

## 2022-03-03 DIAGNOSIS — K7689 Other specified diseases of liver: Secondary | ICD-10-CM | POA: Diagnosis present

## 2022-03-03 DIAGNOSIS — Z79899 Other long term (current) drug therapy: Secondary | ICD-10-CM

## 2022-03-03 DIAGNOSIS — Z8542 Personal history of malignant neoplasm of other parts of uterus: Secondary | ICD-10-CM

## 2022-03-03 DIAGNOSIS — Z8249 Family history of ischemic heart disease and other diseases of the circulatory system: Secondary | ICD-10-CM

## 2022-03-03 DIAGNOSIS — N201 Calculus of ureter: Secondary | ICD-10-CM | POA: Diagnosis present

## 2022-03-03 DIAGNOSIS — N132 Hydronephrosis with renal and ureteral calculous obstruction: Secondary | ICD-10-CM | POA: Diagnosis present

## 2022-03-03 DIAGNOSIS — N200 Calculus of kidney: Secondary | ICD-10-CM | POA: Diagnosis not present

## 2022-03-03 DIAGNOSIS — R1030 Lower abdominal pain, unspecified: Secondary | ICD-10-CM

## 2022-03-03 DIAGNOSIS — D1803 Hemangioma of intra-abdominal structures: Secondary | ICD-10-CM | POA: Diagnosis present

## 2022-03-03 DIAGNOSIS — K56609 Unspecified intestinal obstruction, unspecified as to partial versus complete obstruction: Secondary | ICD-10-CM | POA: Diagnosis present

## 2022-03-03 DIAGNOSIS — Z90722 Acquired absence of ovaries, bilateral: Secondary | ICD-10-CM

## 2022-03-03 DIAGNOSIS — Z90710 Acquired absence of both cervix and uterus: Secondary | ICD-10-CM

## 2022-03-03 DIAGNOSIS — Z87891 Personal history of nicotine dependence: Secondary | ICD-10-CM

## 2022-03-03 DIAGNOSIS — Z6837 Body mass index (BMI) 37.0-37.9, adult: Secondary | ICD-10-CM

## 2022-03-03 LAB — URINALYSIS, ROUTINE W REFLEX MICROSCOPIC
Bilirubin Urine: NEGATIVE
Glucose, UA: NEGATIVE mg/dL
Ketones, ur: 20 mg/dL — AB
Leukocytes,Ua: NEGATIVE
Nitrite: NEGATIVE
Protein, ur: NEGATIVE mg/dL
Specific Gravity, Urine: 1.023 (ref 1.005–1.030)
pH: 5 (ref 5.0–8.0)

## 2022-03-03 LAB — LIPASE, BLOOD: Lipase: 35 U/L (ref 11–51)

## 2022-03-03 LAB — COMPREHENSIVE METABOLIC PANEL
ALT: 16 U/L (ref 0–44)
AST: 27 U/L (ref 15–41)
Albumin: 4.5 g/dL (ref 3.5–5.0)
Alkaline Phosphatase: 65 U/L (ref 38–126)
Anion gap: 7 (ref 5–15)
BUN: 23 mg/dL — ABNORMAL HIGH (ref 6–20)
CO2: 22 mmol/L (ref 22–32)
Calcium: 10.5 mg/dL — ABNORMAL HIGH (ref 8.9–10.3)
Chloride: 110 mmol/L (ref 98–111)
Creatinine, Ser: 0.96 mg/dL (ref 0.44–1.00)
GFR, Estimated: >60 mL/min (ref >60)
Glucose, Bld: 121 mg/dL — ABNORMAL HIGH (ref 70–99)
Potassium: 3.6 mmol/L (ref 3.5–5.1)
Sodium: 139 mmol/L (ref 135–145)
Total Bilirubin: 0.8 mg/dL (ref 0.3–1.2)
Total Protein: 7.5 g/dL (ref 6.5–8.1)

## 2022-03-03 LAB — CBC
HCT: 40.7 % (ref 36.0–46.0)
Hemoglobin: 13.9 g/dL (ref 12.0–15.0)
MCH: 31.6 pg (ref 26.0–34.0)
MCHC: 34.2 g/dL (ref 30.0–36.0)
MCV: 92.5 fL (ref 80.0–100.0)
Platelets: 188 10*3/uL (ref 150–400)
RBC: 4.4 MIL/uL (ref 3.87–5.11)
RDW: 12.8 % (ref 11.5–15.5)
WBC: 10.4 10*3/uL (ref 4.0–10.5)
nRBC: 0 % (ref 0.0–0.2)

## 2022-03-03 MED ORDER — IOHEXOL 300 MG/ML  SOLN
100.0000 mL | Freq: Once | INTRAMUSCULAR | Status: AC | PRN
  Administered 2022-03-03: 100 mL via INTRAVENOUS

## 2022-03-03 MED ORDER — MORPHINE SULFATE (PF) 4 MG/ML IV SOLN
4.0000 mg | Freq: Once | INTRAVENOUS | Status: AC
  Administered 2022-03-03: 4 mg via INTRAVENOUS
  Filled 2022-03-03: qty 1

## 2022-03-03 MED ORDER — ONDANSETRON HCL 4 MG/2ML IJ SOLN
4.0000 mg | Freq: Once | INTRAMUSCULAR | Status: AC
  Administered 2022-03-03: 4 mg via INTRAVENOUS
  Filled 2022-03-03: qty 2

## 2022-03-03 NOTE — ED Provider Notes (Signed)
?[place] DEPT ?Provider Note ? ? ?CSN: [id number] ?Arrival date & time: 03/03/22  1952 ? ?  ? ?History ? ?Chief Complaint  ?Patient presents with  ? Abdominal Pain  ? ? ?Sara Cook is a 56 y.o. female with history of endometrial cancer s/p total abdominal hysterectomy with bilateral salpingo-oophorectomy and iron deficiency anemia who presents the emergency department complaining of pelvic pain started around 5 PM.  She reports several episodes of emesis. She has an appointment with her oncologist on the eighth.  She denies fever, chills, diarrhea, urinary symptoms.  Her last bowel movement was earlier this evening, and states it was normal for her. ? ? ?Abdominal Pain ?Associated symptoms: nausea and vomiting   ?Associated symptoms: no chest pain, no constipation, no diarrhea, no dysuria, no fever, no hematuria and no shortness of breath   ? ?  ? ?Home Medications ?Prior to Admission medications   ?Medication Sig Start Date End Date Taking? Authorizing Provider  ?acetaminophen (TYLENOL) 500 MG tablet Take 500-1,000 mg by mouth every 6 (six) hours as needed for moderate pain. 08/25/20   [provider]  ?albuterol (VENTOLIN HFA) 108 (90 Base) MCG/ACT inhaler Inhale 2 puffs into the lungs every 6 (six) hours as needed for wheezing or shortness of breath. 11/02/21   Betancourt, Aura Fey, NP  ?ibuprofen (ADVIL) 800 MG tablet Take 1 tablet (800 mg total) by mouth every 8 (eight) hours as needed for moderate pain. 10/27/21   Betancourt, Aura Fey, NP  ?Multiple Vitamin (MULTIVITAMIN) capsule Take 1 capsule by mouth daily.    [provider]  ?promethazine (PHENERGAN) 6.25 MG/5ML syrup Take 5 mLs (6.25 mg total) by mouth every 6 (six) hours as needed for up to 10 days for nausea or vomiting. 10/31/21 11/10/21  Betancourt, Aura Fey, NP  ?senna-docusate (SENOKOT-S) 8.6-50 MG tablet Take 2 tablets by mouth at bedtime. For AFTER surgery, do not take if having diarrhea ?Patient  not taking: Reported on 09/15/2021 03/12/21   Joylene John D, NP  ?triamcinolone cream (KENALOG) 0.5 % Apply 1 application topically daily as needed (irritation). 02/27/18   [provider]  ?Vitamin D, Ergocalciferol, (DRISDOL) 1.25 MG (50000 UNIT) CAPS capsule Take 50,000 Units by mouth once a week. 08/29/21   [provider]  ?   ? ?Allergies    ?Patient has no known allergies.   ? ?Review of Systems   ?Review of Systems  ?Constitutional:  Negative for fever.  ?Respiratory:  Negative for shortness of breath.   ?Cardiovascular:  Negative for chest pain.  ?Gastrointestinal:  Positive for abdominal pain, nausea and vomiting. Negative for constipation and diarrhea.  ?Genitourinary:  Negative for dysuria, frequency, hematuria and urgency.  ?All other systems reviewed and are negative. ? ?Physical Exam ?Updated Vital Signs ?BP 138/89   Pulse 91   Temp 97.6 ?F (36.4 ?C) (Oral)   Resp 16   Ht '5\' 7"'$  (1.702 m)   Wt 108.9 kg   SpO2 95%   BMI 37.59 kg/m?  ?Physical Exam ?Vitals and nursing note reviewed.  ?Constitutional:   ?   Appearance: Normal appearance.  ?   Comments: Patient appears uncomfortable  ?HENT:  ?   Head: Normocephalic and atraumatic.  ?Eyes:  ?   Conjunctiva/sclera: Conjunctivae normal.  ?Cardiovascular:  ?   Rate and Rhythm: Normal rate and regular rhythm.  ?Pulmonary:  ?   Effort: Pulmonary effort is normal. No respiratory distress.  ?   Breath sounds: Normal breath sounds.  ?  Abdominal:  ?   General: There is no distension.  ?   Palpations: Abdomen is soft.  ?   Tenderness: There is abdominal tenderness in the right lower quadrant, suprapubic area and left lower quadrant. There is no right CVA tenderness, left CVA tenderness, guarding or rebound.  ?Skin: ?   General: Skin is warm and dry.  ?Neurological:  ?   General: No focal deficit present.  ?   Mental Status: She is alert.  ? ? ?ED Results / Procedures / Treatments   ?Labs ?(all labs ordered are listed, but only abnormal results  are displayed) ?Labs Reviewed  ?COMPREHENSIVE METABOLIC PANEL - Abnormal; Notable for the following components:  ?    Result Value  ? Glucose, Bld 121 (*)   ? BUN 23 (*)   ? Calcium 10.5 (*)   ? All other components within normal limits  ?URINALYSIS, ROUTINE W REFLEX MICROSCOPIC - Abnormal; Notable for the following components:  ? Hgb urine dipstick SMALL (*)   ? Ketones, ur 20 (*)   ? Bacteria, UA RARE (*)   ? All other components within normal limits  ?LIPASE, BLOOD  ?CBC  ? ? ?EKG ?None ? ?Radiology ?CT ABDOMEN PELVIS W CONTRAST ? ?Result Date: 03/03/2022 ?CLINICAL DATA:  Abdominal pain. EXAM: CT ABDOMEN AND PELVIS WITH CONTRAST TECHNIQUE: Multidetector CT imaging of the abdomen and pelvis was performed using the standard protocol following bolus administration of intravenous contrast. RADIATION DOSE REDUCTION: This exam was performed according to the departmental dose-optimization program which includes automated exposure control, adjustment of the mA and/or kV according to patient size and/or use of iterative reconstruction technique. CONTRAST:  152m OMNIPAQUE IOHEXOL 300 MG/ML  SOLN COMPARISON:  None. FINDINGS: Lower chest: Mild cardiomegaly. There is mild posterior atelectasis without infiltrates in the lung bases. Hepatobiliary: 21 cm in length mildly steatotic liver. There is a 2 cm heterogeneously peripherally enhancing lesion in segment 5 which is probably a hemangioma but this needs to be confirmed with hepatic dedicated imaging. Rest of the liver is unremarkable. The gallbladder and bile ducts are unremarkable. Pancreas: No focal abnormality. Spleen: No focal abnormality. Slightly prominent measuring 13.6 cm AP. Adrenals/Urinary Tract: There is no adrenal or renal cortical mass enhancement. There is a small hypodensity in the posterior right kidney which is too small to characterize. There is no renal cortical abnormality elsewhere. On the left, there is delayed cortical contrast enhancement and mild  hydroureteronephrosis with 3 x 2 x 4 mm proximal left ureteral stone superimposing about the tip of the left L4 transverse process. There is a single punctate nonobstructing caliceal stone in the inferior pole right kidney but no intrarenal stones or further right ureteral stones. There are no other ureteral stones. There is no bladder thickening. Stomach/Bowel: The stomach is contracted. The duodenum is unremarkable. There are dilated upper to mid abdominal small bowel segments up to 4 cm caliber which are believed to be jejunal segments. There is a transitional segment which has an apparent kink in the small bowel in the right mid abdomen on series 2 axial image 41 and a short distance from this a separate focal transition on 2:40, suggesting adhesive disease as etiology for small bowel obstruction. The rest of the small bowel is collapsed. The appendix is normal caliber. There are colonic diverticula without evidence of diverticulitis. Vascular/Lymphatic: Mild aortoiliac atherosclerosis. No adenopathy is seen. Reproductive: Status post hysterectomy. No adnexal masses. Other: No abdominal wall incarcerated hernia. There is trace fluid anterior to  the left renal vein just above the level of the left renal pelvis with peripelvic and periureteral stranding changes. There is no free air, hemorrhage or abscess. Musculoskeletal: Mild osteopenia and degenerative change lumbar spine. No worrisome regional bone lesion. IMPRESSION: 1. 3 x 2 x 4 mm proximal left ureteral stone with mild obstructive uropathy. Correlate clinically for infectious complication. 2. Trace fluid seen in the retroperitoneum just above the dilated left renal pelvis. This could be reactive fluid related to obstructive uropathy or could be due to a ureteral leak. This is the only free fluid in the abdomen and pelvis. 3. 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 4. Small right renal hypodensity which is too small to characterize. 5. Intermediate  grade small bowel obstruction with 4 cm dilated proximal small bowel loops and a short-segment with tandem transitions in the right mid abdomen suggesting adhesive disease as etiology, with collapse of the do

## 2022-03-03 NOTE — ED Triage Notes (Addendum)
Pt presents to ED from home with c/o of pelvic pain that began around 5 pm. Pt endorses one episode of emesis around  8pm. Hx of endometrial cancer  ?

## 2022-03-04 ENCOUNTER — Observation Stay (HOSPITAL_COMMUNITY): Payer: No Typology Code available for payment source

## 2022-03-04 DIAGNOSIS — E210 Primary hyperparathyroidism: Secondary | ICD-10-CM | POA: Diagnosis present

## 2022-03-04 DIAGNOSIS — Z6837 Body mass index (BMI) 37.0-37.9, adult: Secondary | ICD-10-CM | POA: Diagnosis not present

## 2022-03-04 DIAGNOSIS — Z90710 Acquired absence of both cervix and uterus: Secondary | ICD-10-CM | POA: Diagnosis not present

## 2022-03-04 DIAGNOSIS — D1803 Hemangioma of intra-abdominal structures: Secondary | ICD-10-CM | POA: Diagnosis present

## 2022-03-04 DIAGNOSIS — K56609 Unspecified intestinal obstruction, unspecified as to partial versus complete obstruction: Secondary | ICD-10-CM | POA: Diagnosis not present

## 2022-03-04 DIAGNOSIS — N132 Hydronephrosis with renal and ureteral calculous obstruction: Secondary | ICD-10-CM | POA: Diagnosis present

## 2022-03-04 DIAGNOSIS — N201 Calculus of ureter: Secondary | ICD-10-CM | POA: Diagnosis present

## 2022-03-04 DIAGNOSIS — Z8542 Personal history of malignant neoplasm of other parts of uterus: Secondary | ICD-10-CM | POA: Diagnosis not present

## 2022-03-04 DIAGNOSIS — Z87891 Personal history of nicotine dependence: Secondary | ICD-10-CM | POA: Diagnosis not present

## 2022-03-04 DIAGNOSIS — K565 Intestinal adhesions [bands], unspecified as to partial versus complete obstruction: Secondary | ICD-10-CM | POA: Diagnosis present

## 2022-03-04 DIAGNOSIS — Z8249 Family history of ischemic heart disease and other diseases of the circulatory system: Secondary | ICD-10-CM | POA: Diagnosis not present

## 2022-03-04 DIAGNOSIS — E669 Obesity, unspecified: Secondary | ICD-10-CM | POA: Diagnosis present

## 2022-03-04 DIAGNOSIS — Z823 Family history of stroke: Secondary | ICD-10-CM | POA: Diagnosis not present

## 2022-03-04 DIAGNOSIS — Z90722 Acquired absence of ovaries, bilateral: Secondary | ICD-10-CM | POA: Diagnosis not present

## 2022-03-04 DIAGNOSIS — Z79899 Other long term (current) drug therapy: Secondary | ICD-10-CM | POA: Diagnosis not present

## 2022-03-04 DIAGNOSIS — K7689 Other specified diseases of liver: Secondary | ICD-10-CM | POA: Diagnosis present

## 2022-03-04 DIAGNOSIS — N200 Calculus of kidney: Secondary | ICD-10-CM | POA: Diagnosis present

## 2022-03-04 LAB — BASIC METABOLIC PANEL
Anion gap: 6 (ref 5–15)
BUN: 19 mg/dL (ref 6–20)
CO2: 23 mmol/L (ref 22–32)
Calcium: 9.4 mg/dL (ref 8.9–10.3)
Chloride: 109 mmol/L (ref 98–111)
Creatinine, Ser: 0.66 mg/dL (ref 0.44–1.00)
GFR, Estimated: >60 mL/min (ref >60)
Glucose, Bld: 109 mg/dL — ABNORMAL HIGH (ref 70–99)
Potassium: 3.7 mmol/L (ref 3.5–5.1)
Sodium: 138 mmol/L (ref 135–145)

## 2022-03-04 MED ORDER — ONDANSETRON HCL 4 MG/2ML IJ SOLN
4.0000 mg | Freq: Four times a day (QID) | INTRAMUSCULAR | Status: DC | PRN
Start: 2022-03-04 — End: 2022-03-05

## 2022-03-04 MED ORDER — MORPHINE SULFATE (PF) 2 MG/ML IV SOLN: 2.0000 mg | INTRAVENOUS | Status: DC | PRN

## 2022-03-04 MED ORDER — DIATRIZOATE MEGLUMINE & SODIUM 66-10 % PO SOLN
90.0000 mL | Freq: Once | ORAL | Status: AC
  Administered 2022-03-04: 90 mL via NASOGASTRIC
  Filled 2022-03-04: qty 90

## 2022-03-04 MED ORDER — PHENOL 1.4 % MT LIQD
2.0000 | OROMUCOSAL | Status: DC | PRN
Start: 2022-03-04 — End: 2022-03-05
  Administered 2022-03-04: 2 via OROMUCOSAL
  Filled 2022-03-04: qty 177

## 2022-03-04 MED ORDER — SODIUM CHLORIDE 0.9 % IV SOLN: INTRAVENOUS | Status: DC

## 2022-03-04 NOTE — ED Provider Notes (Signed)
?  Physical Exam  ?BP 138/89   Pulse 91   Temp 97.6 ?F (36.4 ?C) (Oral)   Resp 16   Ht '5\' 7"'$  (1.702 m)   Wt 108.9 kg   SpO2 95%   BMI 37.59 kg/m?  ? ?Physical Exam ? ?Procedures  ?Procedures ? ?ED Course / MDM  ? ?Clinical Course as of 03/04/22 0053  ?Thu Mar 04, 2022  ?0019 Consult to Surgery Dr. Georgette Dover, who agrees with plan for NG and medical admission. I appreciate his collaboration in the care of this patient.  [RS]  ?0040 Consult to Dr. Flossie Buffy, who is agreeable to seeing the patient and admitting her to her service I appreciate her collaboration in the care of this patient.  [RS]  ?  ?Clinical Course User Index ?[RS] Cordale Manera, Gypsy Balsam, PA-C  ? ?Medical Decision Making ?Amount and/or Complexity of Data Reviewed ?Labs: ordered. ?Radiology: ordered. ? ?Risk ?Prescription drug management. ?Decision regarding hospitalization. ? ? ? ?Care of patient assumed from preceding ED provider Lorin Roemhildt, PA-C at time of shift change.  Please see her associated note for further insight into the patient's ED course.  In brief patient has a history with endometrial cancer who presents today with concern for lower abdominal/pelvic pain that started around 5 PM.  Several episodes of NBNB emesis.  Denies fevers, chills, diarrhea, or urinary symptoms.  Last bowel movement earlier this evening, normal per patient. ? ? ?CBC unremarkable, CMP  unremarkable, urine with small hemoglobin otherwise noninfectious.  CT of the abdomen pelvis revealed 3 x 2 x 4 mm proximal left ureteral stone with mild obstructive uropathy with questionable ureteral leak versus reactive fluid related to obstructive uropathy.  Additionally patient has intermediate grade small bowel obstruction with 4 cm dilated proximal small bowel loops with tandem transitions in the mid abdomen suggesting adhesions. ? ?At time of shift change awaiting hospital medicine consult, surgical consult. Plan for NG and admission.  ? ?Consult to hospitalist as above.   Patient reevaluated and remains pain and nausea free at this time.  Resting comfortably in her ED room. ? ?Deepti voiced understanding of her medical evaluation and treatment plan. Each of their questions answered to their expressed satisfaction. She is ammenable to plan for admission at this time.  ? ?This chart was dictated using voice recognition software, Dragon. Despite the best efforts of this provider to proofread and correct errors, errors may still occur which can change documentation meaning. ? ?  ?Emeline Darling, PA-C ?03/04/22 8938 ? ?  ?Merrily Pew, MD ?03/04/22 [phone] ? ?

## 2022-03-04 NOTE — Assessment & Plan Note (Addendum)
Body mass index is 37.59 kg/m?[name]  ?Placing the pt at higher risk of poor outcomes. ?

## 2022-03-04 NOTE — Consult Note (Addendum)
? ? ? ?Sara Cook ?February 22, 1966  ?[id number].   ? ?Requesting MD: Posey Pronto, MD ?Chief Complaint/Reason for Consult: SBO ? ?HPI:  ?Sara Cook is a 56 year old female with a past medical history significant for endometrial carcinoma status post robotic hysterectomy 03/2021 by Dr. Denman George who presented to the emergency department with a chief complaint of acute onset abdominal pain starting yesterday.  Pain described as severe, tight, nonradiating, lower abdominal pain.  Patient denies similar pain in the past.  Patient tried taking Advil without relief in symptoms.  Developed vomiting and worsening pain yesterday evening so she presented to the emergency department for evaluation.  States she last had flatus yesterday evening.  Her last bowel movement was 2 days ago.  At baseline she reports daily bowel movements in the morning without use of laxative medications. She denies fever, chills, sick contacts, back pain, or urinary symptoms.  States her only other medical problem is overactive thyroid for which she tries naturopathic medication, is not on a prescription medication.  Denies a history of any other abdominal surgeries.  States her last colonoscopy was a couple of years ago (per chart review 2019) and was negative for any indication malignancy.  Patient is a former smoker states she quit 15 years ago.  Denies alcohol use.  Denies other drug use.  Her sister and daughter are at the bedside.  Patient is currently employed at [employer].  ? ?CT of the abdomen and pelvis performed in the emergency department yesterday shows a proximal left ureteral stone with mild obstructive symptoms as well as nonobstructive small nephrolithiasis on the right.  It also showed a small bowel obstruction with mildly dilated proximal small bowel loops with a transition in the right midabdomen.  Diverticulosis without diverticulitis.  2 cm lesion in segment 5 of the liver that looks consistent with hemangioma.   General surgery has been asked to consult for small bowel obstruction ? ? ?ROS: As above ?Review of Systems  ?All other systems reviewed and are negative. ? ?Family History  ?Problem Relation Age of Onset  ? Heart attack Mother   ? Stroke Father   ? Breast cancer Neg Hx   ? Colon cancer Neg Hx   ? Ovarian cancer Neg Hx   ? Endometrial cancer Neg Hx   ? Pancreatic cancer Neg Hx   ? Prostate cancer Neg Hx   ? ? ?Past Medical History:  ?Diagnosis Date  ? Endometrial cancer ([place])   ? History of iron deficiency anemia   ? PMB (postmenopausal bleeding) 02/19/2021  ? Pneumonia   ? history of a child  ? ? ?Past Surgical History:  ?Procedure Laterality Date  ? COLONOSCOPY    ? MOUTH SURGERY    ? ROBOTIC ASSISTED TOTAL HYSTERECTOMY WITH BILATERAL SALPINGO OOPHERECTOMY Bilateral 03/17/2021  ? Procedure: XI ROBOTIC ASSISTED TOTAL HYSTERECTOMY WITH BILATERAL SALPINGO OOPHORECTOMY;  Surgeon: Everitt Amber, MD;  Location: [facility];  Service: Gynecology;  Laterality: Bilateral;  ? SENTINEL NODE BIOPSY N/A 03/17/2021  ? Procedure: SENTINEL NODE BIOPSY;  Surgeon: Everitt Amber, MD;  Location: [facility];  Service: Gynecology;  Laterality: N/A;  ? ? ?Social History:  reports that she quit smoking about 23 years ago. Her smoking use included cigarettes. She has a 30.00 pack-year smoking history. She has never used smokeless tobacco. She reports current alcohol use. She reports that she does not use drugs. ? ?Allergies: No Known Allergies ? ?(Not in a hospital admission) ? ? ? ?Physical Exam: ?Blood pressure [name])  142/88, pulse 76, temperature 98.3 ?F (36.8 ?C), temperature source Oral, resp. rate 15, height '5\' 7"'$  (1.702 m), weight 108.9 kg, SpO2 96 %. ?General: White female laying in hospital bed in no acute distress ?HEENT: head -normocephalic, atraumatic; Eyes: pupils are equal and round, anicteric sclerae; NG tube in place with clear output in NG canister. ?Neck- Trachea is midline, no thyromegaly or JVD appreciated.  ?CV- RRR, pedal pulses 2+  bilaterally without pitting edema ?Pulm- breathing is non-labored on room air ?Abd- soft, mild distention, overall nontender, no peritonitis, no palpable organomegaly, previous laparoscopic incision sites appear well-healing.  No masses or hernias appreciated.  No appreciable costovertebral angle tenderness bilaterally ?GU- deferred  ?MSK- UE/LE symmetrical, no cyanosis, clubbing, or edema. ?Neuro- CN II-XII grossly in tact, no paresthesias. ?Psych- Alert and Oriented x3 with appropriate affect ?Skin: warm and dry, no rashes or lesions ? ?Results for orders placed or performed during the hospital encounter of 03/03/22 (from the past 48 hour(s))  ?Lipase, blood     Status: None  ? Collection Time: 03/03/22  9:20 PM  ?Result Value Ref Range  ? Lipase 35 11 - 51 U/L  ?  Comment: Performed at [facility] [place] [address], [place], [place] [address]  ?Comprehensive metabolic panel     Status: Abnormal  ? Collection Time: 03/03/22  9:20 PM  ?Result Value Ref Range  ? Sodium 139 135 - 145 mmol/L  ? Potassium 3.6 3.5 - 5.1 mmol/L  ? Chloride 110 98 - 111 mmol/L  ? CO2 22 22 - 32 mmol/L  ? Glucose, Bld 121 (H) 70 - 99 mg/dL  ?  Comment: Glucose reference range applies only to samples taken after fasting for at least 8 hours.  ? BUN 23 (H) 6 - 20 mg/dL  ? Creatinine, Ser 0.96 0.44 - 1.00 mg/dL  ? Calcium 10.5 (H) 8.9 - 10.3 mg/dL  ? Total Protein 7.5 6.5 - 8.1 g/dL  ? Albumin 4.5 3.5 - 5.0 g/dL  ? AST 27 15 - 41 U/L  ? ALT 16 0 - 44 U/L  ? Alkaline Phosphatase 65 38 - 126 U/L  ? Total Bilirubin 0.8 0.3 - 1.2 mg/dL  ? GFR, Estimated >60 >60 mL/min  ?  Comment: (NOTE) ?Calculated using the CKD-EPI Creatinine Equation (2021) ?  ? Anion gap 7 5 - 15  ?  Comment: Performed at [facility], [place] [address], [place], [place] [address]  ?CBC     Status: None  ? Collection Time: 03/03/22  9:20 PM  ?Result Value Ref Range  ? WBC 10.4 4.0 - 10.5 K/uL  ? RBC 4.40 3.87 - 5.11 MIL/uL  ? Hemoglobin 13.9  12.0 - 15.0 g/dL  ? HCT 40.7 36.0 - 46.0 %  ? MCV 92.5 80.0 - 100.0 fL  ? MCH 31.6 26.0 - 34.0 pg  ? MCHC 34.2 30.0 - 36.0 g/dL  ? RDW 12.8 11.5 - 15.5 %  ? Platelets 188 150 - 400 K/uL  ? nRBC 0.0 0.0 - 0.2 %  ?  Comment: Performed at [facility], [place] [address] [place], [place] [address]  ?Urinalysis, Routine w reflex microscopic     Status: Abnormal  ? Collection Time: 03/03/22  9:40 PM  ?Result Value Ref Range  ? Color, Urine YELLOW YELLOW  ? APPearance CLEAR CLEAR  ? Specific Gravity, Urine 1.023 1.005 - 1.030  ? pH 5.0 5.0 - 8.0  ? Glucose, UA NEGATIVE NEGATIVE mg/dL  ?  Hgb urine dipstick SMALL (A) NEGATIVE  ? Bilirubin Urine NEGATIVE NEGATIVE  ? Ketones, ur 20 (A) NEGATIVE mg/dL  ? Protein, ur NEGATIVE NEGATIVE mg/dL  ? Nitrite NEGATIVE NEGATIVE  ? Leukocytes,Ua NEGATIVE NEGATIVE  ? RBC / HPF 21-50 0 - 5 RBC/hpf  ? WBC, UA 0-5 0 - 5 WBC/hpf  ? Bacteria, UA RARE (A) NONE SEEN  ? Squamous Epithelial / LPF 0-5 0 - 5  ? Mucus PRESENT   ? Hyaline Casts, UA PRESENT   ?  Comment: Performed at [facility], [place] [address], [place], [place] [address]  ?Basic metabolic panel     Status: Abnormal  ? Collection Time: 03/04/22  5:57 AM  ?Result Value Ref Range  ? Sodium 138 135 - 145 mmol/L  ? Potassium 3.7 3.5 - 5.1 mmol/L  ? Chloride 109 98 - 111 mmol/L  ? CO2 23 22 - 32 mmol/L  ? Glucose, Bld 109 (H) 70 - 99 mg/dL  ?  Comment: Glucose reference range applies only to samples taken after fasting for at least 8 hours.  ? BUN 19 6 - 20 mg/dL  ? Creatinine, Ser 0.66 0.44 - 1.00 mg/dL  ? Calcium 9.4 8.9 - 10.3 mg/dL  ? GFR, Estimated >60 >60 mL/min  ?  Comment: (NOTE) ?Calculated using the CKD-EPI Creatinine Equation (2021) ?  ? Anion gap 6 5 - 15  ?  Comment: Performed at [facility], [place] [address] [place], [place] [address]  ? ?DG Abdomen 1 View ? ?Result Date: 03/04/2022 ?CLINICAL DATA:  NG tube placement. EXAM: ABDOMEN - 1 VIEW COMPARISON:  CT abdomen and  pelvis 03/03/2022. FINDINGS: Nasogastric tube tip is at the level of the proximal residual contrast is seen in the renal collecting systems. There is mild left hydronephrosis. There is gaseous distention of bowe

## 2022-03-04 NOTE — H&P (Signed)
?History and Physical  ? ? ?Patient: Sara Cook MLY:[id number] DOB: January 28, 1966 ?DOA: 03/03/2022 ?DOS: the patient was seen and examined on 03/04/2022 ?PCP: Loraine Leriche., MD  ?Patient coming from: Home ? ?Chief Complaint:  ?Chief Complaint  ?Patient presents with  ? Abdominal Pain  ? ?HPI: Sara Cook is a 56 y.o. female with medical history significant of endometrial cancer s/p total hysterectomy with bilateral salpingo-oophorectomy (03/2021), primary hyperparathyroidism who presents with concerns of abdominal pain. ? ?She noticed new onset lower abdominal pain around late afternoon that worsened after she attempted to eat dinner.  She then had nausea and 2 episodes of vomiting at home and decided to present to the ED.  Denies any diarrhea or constipation.  Denies any flank pain.  No dysuria. ? ?In the ED, she was afebrile initially hypertensive up to SBP of 164 on room air.  CBC negative for leukocytosis or anemia.  CMP with mildly elevated calcium 10.5 but otherwise unremarkable. ? ?CT of the abdomen and pelvis shows mild obstructive uropathy with left ureteral stone.  Trace fluid in the retroperitoneum above the dilated left renal pelvis possible leave reactive fluid versus ureteral leak.  Intermittent grade small bowel obstruction in the right mid abdomen suggestive of adhesive disease. ? ?UA was negative leukocyte nitrite with rare bacteria and hyaline casts. ? ?ED PA consulted general surgery who recommended placement of NG tube and will see in consultation tomorrow. ?Hospitalist then called for further management of SBO. ? ?Review of Systems: As mentioned in the history of present illness. All other systems reviewed and are negative. ?Past Medical History:  ?Diagnosis Date  ? Endometrial cancer ([place])   ? History of iron deficiency anemia   ? PMB (postmenopausal bleeding) 02/19/2021  ? Pneumonia   ? history of a child  ? ?Past Surgical History:  ?Procedure Laterality Date  ? COLONOSCOPY    ?  MOUTH SURGERY    ? ROBOTIC ASSISTED TOTAL HYSTERECTOMY WITH BILATERAL SALPINGO OOPHERECTOMY Bilateral 03/17/2021  ? Procedure: XI ROBOTIC ASSISTED TOTAL HYSTERECTOMY WITH BILATERAL SALPINGO OOPHORECTOMY;  Surgeon: Everitt Amber, MD;  Location: [facility];  Service: Gynecology;  Laterality: Bilateral;  ? SENTINEL NODE BIOPSY N/A 03/17/2021  ? Procedure: SENTINEL NODE BIOPSY;  Surgeon: Everitt Amber, MD;  Location: [facility];  Service: Gynecology;  Laterality: N/A;  ? ?Social History:  reports that she quit smoking about 23 years ago. Her smoking use included cigarettes. She has a 30.00 pack-year smoking history. She has never used smokeless tobacco. She reports current alcohol use. She reports that she does not use drugs. ? ?No Known Allergies ? ?Family History  ?Problem Relation Age of Onset  ? Heart attack Mother   ? Stroke Father   ? Breast cancer Neg Hx   ? Colon cancer Neg Hx   ? Ovarian cancer Neg Hx   ? Endometrial cancer Neg Hx   ? Pancreatic cancer Neg Hx   ? Prostate cancer Neg Hx   ? ? ?Prior to Admission medications   ?Medication Sig Start Date End Date Taking? Authorizing Provider  ?acetaminophen (TYLENOL) 500 MG tablet Take 500-1,000 mg by mouth every 6 (six) hours as needed for moderate pain. 08/25/20   [provider]  ?albuterol (VENTOLIN HFA) 108 (90 Base) MCG/ACT inhaler Inhale 2 puffs into the lungs every 6 (six) hours as needed for wheezing or shortness of breath. 11/02/21   Betancourt, Aura Fey, NP  ?ibuprofen (ADVIL) 800 MG tablet Take 1 tablet (800 mg total) by mouth every  8 (eight) hours as needed for moderate pain. 10/27/21   Betancourt, Aura Fey, NP  ?Multiple Vitamin (MULTIVITAMIN) capsule Take 1 capsule by mouth daily.    [provider]  ?promethazine (PHENERGAN) 6.25 MG/5ML syrup Take 5 mLs (6.25 mg total) by mouth every 6 (six) hours as needed for up to 10 days for nausea or vomiting. 10/31/21 11/10/21  Betancourt, Aura Fey, NP  ?senna-docusate (SENOKOT-S) 8.6-50 MG tablet Take 2 tablets  by mouth at bedtime. For AFTER surgery, do not take if having diarrhea ?Patient not taking: Reported on 09/15/2021 03/12/21   Joylene John D, NP  ?triamcinolone cream (KENALOG) 0.5 % Apply 1 application topically daily as needed (irritation). 02/27/18   [provider]  ?Vitamin D, Ergocalciferol, (DRISDOL) 1.25 MG (50000 UNIT) CAPS capsule Take 50,000 Units by mouth once a week. 08/29/21   [provider]  ? ? ?Physical Exam: ?Vitals:  ? 03/03/22 2200 03/03/22 2215 03/03/22 2300 03/04/22 0005  ?BP: (!) 149/83 (!) 147/83 132/85 138/89  ?Pulse: 83 80 87 91  ?Resp:  '20 16 16  '$ ?Temp:      ?TempSrc:      ?SpO2: 94% 93% 97% 95%  ?Weight:      ?Height:      ? ?Constitutional: NAD, calm, obese female sitting upright in bed ?Eyes: PERRL, lids and conjunctivae normal, tearing of the right eye likely secondary to NG tube placement ?ENMT: Mucous membranes are moist. NG tube placed to right nostril.  ?Neck: normal, supple,  ?Respiratory: clear to auscultation bilaterally, no wheezing, no crackles. Normal respiratory effort. No accessory muscle use.  ?Cardiovascular: Regular rate and rhythm, no murmurs / rubs / gallops. No extremity edema.  ?Abdomen: Soft, non-distended but somewhat tense abdomen with no tenderness, no rebound tenderness, guarding or rigidity.  No definitive bowel sounds heard. ?Musculoskeletal: no clubbing / cyanosis. No joint deformity upper and lower extremities. Good ROM, no contractures. Normal muscle tone.  ?Skin: no rashes, lesions, ulcers.  ?Neurologic: CN 2-12 grossly intact.  ?Psychiatric: Normal judgment and insight. Alert and oriented x 3. Normal mood. ?Data Reviewed: ? ?See HPI ? ?Assessment and Plan: ?* SBO (small bowel obstruction) ([place]) ?Due to adhesions from history of total hysterectomy for endometrial cancer ?-NG tube placed to low intermittent suction and general surgery consulted to see in the morning ?-gentle IV fluid while NPO  ? ?Ureteral stone ?Mild obstructive left  proximal urethral stone with negative UA and without any flank pain.  CT abdomen/pelvis mentions possible reactive fluid versus ureteral leak.  Suspect more reactive given lack of symptoms. ?-strain all urine ? ? ?Hypercalcemia ?Mildly elevated.  Follow repeat tomorrow after continuous IV fluid.  She has a history of hyperparathyroidism and is pending a surgical consult outpatient. ? ?Obesity (BMI 30-39.9) ?BMI of 37 ? ?Liver nodule ?24m liver nodule likely hemangioma but recommends outpatient liver MRI for follow-up ? ? ? ? ? Advance Care Planning:   Code Status: Full Code  ? ?Consults: General surgery ? ?Family Communication: no family at bedside ? ?Severity of Illness: ?The appropriate patient status for this patient is OBSERVATION. Observation status is judged to be reasonable and necessary in order to provide the required intensity of service to ensure the patient's safety. The patient's presenting symptoms, physical exam findings, and initial radiographic and laboratory data in the context of their medical condition is felt to place them at decreased risk for further clinical deterioration. Furthermore, it is anticipated that the patient will be medically stable for discharge  from the hospital within 2 midnights of admission.  ? ?Author: Orene Desanctis, DO ?03/04/2022 2:30 AM ? ?For on call review [email or web address].  ?

## 2022-03-04 NOTE — Progress Notes (Signed)
[facility] ?PLAN OF CARE NOTE ?Patient: Sara Cook QKS:[id number]   ?PCP: Loraine Leriche., MD DOB: 07-01-1966   ?DOA: 03/03/2022   DOS: 03/04/2022   ? ?Patient was admitted by my colleague earlier on 03/04/2022. I have reviewed the H&P as well as assessment and plan and agree with the same. ?Important changes in the plan are listed below. ? ?Plan of care: ?Principal Problem: ?  SBO (small bowel obstruction) ([place]) ?Active Problems: ?  Ureteral stone ?  Hypercalcemia ?  Liver nodule ?  Obesity (BMI 30-39.9) ?Appreciate surgical consultation.  Patient started on small bowel protocol.  Will monitor response. ? ?Author: ?Berle Mull, MD ?[facility] ?03/04/2022 7:29 PM   ?If 7PM-7AM, please contact night-coverage at [email or web address] ?   ?

## 2022-03-04 NOTE — Assessment & Plan Note (Addendum)
Mildly elevated.  Improved with fluid. ?She has a history of hyperparathyroidism and is pending a surgical consult outpatient. ?

## 2022-03-04 NOTE — Assessment & Plan Note (Signed)
Mild obstructive left proximal urethral stone with negative UA and without any flank pain.  CT abdomen/pelvis mentions possible reactive fluid versus ureteral leak.  Suspect more reactive given lack of symptoms. ?-strain all urine ? ?

## 2022-03-04 NOTE — Assessment & Plan Note (Signed)
24m liver nodule likely hemangioma but recommends outpatient liver MRI for follow-up ?

## 2022-03-04 NOTE — Assessment & Plan Note (Addendum)
Due to adhesions from history of total hysterectomy for endometrial cancer ?Treated conservatively with NG tube decompression. ?Small bowel protocol was initiated. ?Patient was able to tolerate full liquid diet. ?Patient had multiple bowel movements in the hospital. ?Discharged home with follow-up with PCP. ?

## 2022-03-05 DIAGNOSIS — K56609 Unspecified intestinal obstruction, unspecified as to partial versus complete obstruction: Secondary | ICD-10-CM | POA: Diagnosis not present

## 2022-03-05 MED ORDER — DOCUSATE SODIUM 100 MG PO CAPS: 100.0000 mg | ORAL_CAPSULE | Freq: Every day | ORAL | 2 refills | Status: DC | PRN

## 2022-03-05 NOTE — Progress Notes (Signed)
Transition of Care (TOC) Screening Note ? ?Patient Details  ?Name: Sara Cook ?Date of Birth: November 15, 1966 ? ?Transition of Care (TOC) CM/SW Contact:    ?Sherie Don, LCSW ?Phone Number: ?03/05/2022, 10:30 AM ? ?Transition of Care Department [facility]) has reviewed patient and no TOC needs have been identified at this time. We will continue to monitor patient advancement through interdisciplinary progression rounds. If new patient transition needs arise, please place a TOC consult. ?

## 2022-03-05 NOTE — Progress Notes (Signed)
[facility] ?Progress Note ? ?   ?Subjective: ?CC:  ?Pain improved, mild cramping after BMs. Having flatus and BMs. Denies nausea or vomiting. Reports minimal NG output. ? ?Objective: ?Vital signs in last 24 hours: ?Temp:  [98.3 ?F (36.8 ?C)-99.7 ?F (37.6 ?C)] 99.7 ?F (37.6 ?C) (04/21 1749) ?Pulse Rate:  [80-94] 82 (04/21 0511) ?Resp:  [14-18] 18 (04/21 0511) ?BP: (153-167)/(72-88) 153/81 (04/21 0511) ?SpO2:  [91 %-96 %] 94 % (04/21 0511) ?Last BM Date : 03/05/22 ? ?Intake/Output from previous day: ?04/20 0701 - 04/21 0700 ?In: 1877.5 [I.V.:1877.5] ?Out: 600 [Urine:400; Emesis/NG output:200] ?Intake/Output this shift: ?Total I/O ?In: 170 [P.O.:170] ?Out: -  ? ?PE: ?Gen:  Alert, NAD, pleasant ?Card:  Regular rate and rhythm ?Pulm:  Normal effort ORA ?Abd: Soft, non-tender, non-distended, +BS, no hernias. ?Skin: warm and dry, no rashes  ?Psych: A&Ox3  ? ?Lab Results:  ?Recent Labs  ?  03/03/22 ?2120  ?WBC 10.4  ?HGB 13.9  ?HCT 40.7  ?PLT 188  ? ?BMET ?Recent Labs  ?  03/03/22 ?2120 03/04/22 ?4496  ?NA 139 138  ?K 3.6 3.7  ?CL 110 109  ?CO2 22 23  ?GLUCOSE 121* 109*  ?BUN 23* 19  ?CREATININE 0.96 0.66  ?CALCIUM 10.5* 9.4  ? ?PT/INR ?No results for input(s): LABPROT, INR in the last 72 hours. ?CMP  ?   ?Component Value Date/Time  ? NA 138 03/04/2022 0557  ? NA 140 09/17/2021 0949  ? K 3.7 03/04/2022 0557  ? CL 109 03/04/2022 0557  ? CO2 23 03/04/2022 0557  ? GLUCOSE 109 (H) 03/04/2022 0557  ? BUN 19 03/04/2022 0557  ? BUN 17 09/17/2021 0949  ? CREATININE 0.66 03/04/2022 0557  ? CALCIUM 9.4 03/04/2022 0557  ? PROT 7.5 03/03/2022 2120  ? PROT 6.8 09/17/2021 0949  ? ALBUMIN 4.5 03/03/2022 2120  ? ALBUMIN 4.3 09/17/2021 0949  ? AST 27 03/03/2022 2120  ? ALT 16 03/03/2022 2120  ? ALKPHOS 65 03/03/2022 2120  ? BILITOT 0.8 03/03/2022 2120  ? BILITOT 0.6 09/17/2021 0949  ? GFRNONAA >60 03/04/2022 0557  ? GFRAA 104 04/28/2016 0815  ? ?Lipase  ?   ?Component Value Date/Time  ? LIPASE 35 03/03/2022 2120   ? ? ? ? ? ?Studies/Results: ?DG Abdomen 1 View ? ?Result Date: 03/04/2022 ?CLINICAL DATA:  NG tube placement. EXAM: ABDOMEN - 1 VIEW COMPARISON:  CT abdomen and pelvis 03/03/2022. FINDINGS: Nasogastric tube tip is at the level of the proximal residual contrast is seen in the renal collecting systems. There is mild left hydronephrosis. There is gaseous distention of bowel in the upper abdomen as seen on the prior CT. Body of the stomach. IMPRESSION: 1. Nasogastric tube tip is in the proximal body of the stomach. Electronically Signed   By: Ronney Asters M.D.   On: 03/04/2022 01:04  ? ?CT ABDOMEN PELVIS W CONTRAST ? ?Result Date: 03/03/2022 ?CLINICAL DATA:  Abdominal pain. EXAM: CT ABDOMEN AND PELVIS WITH CONTRAST TECHNIQUE: Multidetector CT imaging of the abdomen and pelvis was performed using the standard protocol following bolus administration of intravenous contrast. RADIATION DOSE REDUCTION: This exam was performed according to the departmental dose-optimization program which includes automated exposure control, adjustment of the mA and/or kV according to patient size and/or use of iterative reconstruction technique. CONTRAST:  166m OMNIPAQUE IOHEXOL 300 MG/ML  SOLN COMPARISON:  None. FINDINGS: Lower chest: Mild cardiomegaly. There is mild posterior atelectasis without infiltrates in the lung bases. Hepatobiliary: 21 cm in length mildly steatotic  liver. There is a 2 cm heterogeneously peripherally enhancing lesion in segment 5 which is probably a hemangioma but this needs to be confirmed with hepatic dedicated imaging. Rest of the liver is unremarkable. The gallbladder and bile ducts are unremarkable. Pancreas: No focal abnormality. Spleen: No focal abnormality. Slightly prominent measuring 13.6 cm AP. Adrenals/Urinary Tract: There is no adrenal or renal cortical mass enhancement. There is a small hypodensity in the posterior right kidney which is too small to characterize. There is no renal cortical abnormality  elsewhere. On the left, there is delayed cortical contrast enhancement and mild hydroureteronephrosis with 3 x 2 x 4 mm proximal left ureteral stone superimposing about the tip of the left L4 transverse process. There is a single punctate nonobstructing caliceal stone in the inferior pole right kidney but no intrarenal stones or further right ureteral stones. There are no other ureteral stones. There is no bladder thickening. Stomach/Bowel: The stomach is contracted. The duodenum is unremarkable. There are dilated upper to mid abdominal small bowel segments up to 4 cm caliber which are believed to be jejunal segments. There is a transitional segment which has an apparent kink in the small bowel in the right mid abdomen on series 2 axial image 41 and a short distance from this a separate focal transition on 2:40, suggesting adhesive disease as etiology for small bowel obstruction. The rest of the small bowel is collapsed. The appendix is normal caliber. There are colonic diverticula without evidence of diverticulitis. Vascular/Lymphatic: Mild aortoiliac atherosclerosis. No adenopathy is seen. Reproductive: Status post hysterectomy. No adnexal masses. Other: No abdominal wall incarcerated hernia. There is trace fluid anterior to the left renal vein just above the level of the left renal pelvis with peripelvic and periureteral stranding changes. There is no free air, hemorrhage or abscess. Musculoskeletal: Mild osteopenia and degenerative change lumbar spine. No worrisome regional bone lesion. IMPRESSION: 1. 3 x 2 x 4 mm proximal left ureteral stone with mild obstructive uropathy. Correlate clinically for infectious complication. 2. Trace fluid seen in the retroperitoneum just above the dilated left renal pelvis. This could be reactive fluid related to obstructive uropathy or could be due to a ureteral leak. This is the only free fluid in the abdomen and pelvis. 3. 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 4.  Small right renal hypodensity which is too small to characterize. 5. Intermediate grade small bowel obstruction with 4 cm dilated proximal small bowel loops and a short-segment with tandem transitions in the right mid abdomen suggesting adhesive disease as etiology, with collapse of the downstream small bowel. 6. Diverticulosis without evidence of diverticulitis. 7. Mildly steatotic liver, mildly prominent liver and spleen, and a 2 cm lesion in segment 5 which is probably a hemangioma but should be further evaluated with liver dedicated MRI to confirm a benign process. 8. Osteopenia and degenerative change. 9. Mild cardiomegaly. 10. Aortic atherosclerosis. Electronically Signed   By: Telford Nab M.D.   On: 03/03/2022 23:45  ? ?DG Abd Portable 1V-Small Bowel Obstruction Protocol-initial, 8 hr delay ? ?Result Date: 03/04/2022 ?CLINICAL DATA:  8 hour delay for small-bowel obstruction EXAM: PORTABLE ABDOMEN - 1 VIEW COMPARISON:  03/04/2022 at 12:30 a.m. FINDINGS: Supine frontal view of the abdomen and pelvis demonstrates progression of oral contrast throughout the colon to the level of the rectum. No evidence of bowel obstruction or ileus. No masses or abnormal calcifications. IMPRESSION: 1. Progression of oral contrast into the colon, with no evidence of small-bowel obstruction on this exam. Electronically Signed  By: Randa Ngo M.D.   On: 03/04/2022 17:24   ? ?Anti-infectives: ?Anti-infectives (From admission, onward)  ? ? None  ? ?  ? ? ? ?Assessment/Plan ? Small bowel obstruction ?Resolved s/p SBO protocol. KUB shows contrast in her colon. She is having flatus and BMs. FLD ordered. Advance to soft as tolerated. Stable for discharge from [facility] perspective once tolerating PO. Discussed SBO pathophysiology, dietary instructions, return precautions with the patient.  ? ? LOS: 1 day  ? ?I reviewed nursing notes, hospitalist notes, last 24 h vitals and pain scores, last 48 h intake and output, last 24 h labs and  trends, and last 24 h imaging results. ? ? ?Obie Dredge, PA-C ?[facility] ?Please see Amion for pager number during day hours 7:00am-4:30pm ? ? ? ? ? ? ?

## 2022-03-05 NOTE — Progress Notes (Signed)
Patient was given discharge instructions, and all questions were answered. Patient was stable for discharge and was walked to the main exit. 

## 2022-03-07 NOTE — Discharge Summary (Signed)
?Physician Discharge Summary ?  ?Patient: Sara Cook MRN: [id number] DOB: 16-Jan-1966  ?Admit date:     03/03/2022  ?Discharge date: 03/05/2022  ?Discharge Physician: Berle Mull  ?PCP: Loraine Leriche., MD ? ?Recommendations at discharge: ?Follow-up with PCP as recommended ?Outpatient MRI for liver lesion. ? ?Discharge Diagnoses: ?Principal Problem: ?  SBO (small bowel obstruction) ([place]) ?Active Problems: ?  Ureteral stone ?  Hypercalcemia ?  Liver nodule ?  Obesity (BMI 30-39.9) ? ? ?Hospital Course: ? Sara Cook is a 56 y.o. female with medical history significant of endometrial cancer s/p total hysterectomy with bilateral salpingo-oophorectomy (03/2021), primary hyperparathyroidism who presents with concerns of abdominal pain. ?Found to have small bowel obstruction treated conservatively. ? ?Assessment and Plan: ?* SBO (small bowel obstruction) ([place]) ?Due to adhesions from history of total hysterectomy for endometrial cancer ?Treated conservatively with NG tube decompression. ?Small bowel protocol was initiated. ?Patient was able to tolerate full liquid diet. ?Patient had multiple bowel movements in the hospital. ?Discharged home with follow-up with PCP. ? ?Ureteral stone ?Mild obstructive left proximal urethral stone with negative UA and without any flank pain.  CT abdomen/pelvis mentions possible reactive fluid versus ureteral leak.  Suspect more reactive given lack of symptoms. ?-strain all urine ? ? ?Hypercalcemia ?Mildly elevated.  Improved with fluid. ?She has a history of hyperparathyroidism and is pending a surgical consult outpatient. ? ?Obesity (BMI 30-39.9) ?Body mass index is 37.59 kg/m?[name]  ?Placing the pt at higher risk of poor outcomes. ? ?Liver nodule ?26m liver nodule likely hemangioma but recommends outpatient liver MRI for follow-up ? ?Consultants: General surgery  ?Procedures performed:  ?none ?DISCHARGE MEDICATION: ?Allergies as of 03/05/2022   ?No Known Allergies ?  ? ?   ?Medication List  ?  ? ?TAKE these medications   ? ?docusate sodium 100 MG capsule ?Commonly known as: Colace ?Take 1 capsule (100 mg total) by mouth daily as needed. ?  ?ibuprofen 200 MG tablet ?Commonly known as: ADVIL ?Take 400 mg by mouth every 6 (six) hours as needed (pain). ?  ?OVER THE COUNTER MEDICATION ?Take 1-2 capsules by mouth See admin instructions. Thyroid support supplement (gai herbs - from [employer]) - take 2 capsules by mouth every morning and 1 capsule at night ?  ?promethazine 6.25 MG/5ML syrup ?Commonly known as: PHENERGAN ?Take 5 mLs (6.25 mg total) by mouth every 6 (six) hours as needed for up to 10 days for nausea or vomiting. ?  ?VITAMIN D3 PO ?Take 1 capsule by mouth 2 (two) times daily. ?  ? ?  ? ? ?Disposition: Home ?Diet recommendation: Full liquid diet ? ?Discharge Exam: ?Vitals:  ? 03/04/22 2054 03/05/22 0214 03/05/22 0511 03/05/22 1327  ?BP: (!) 162/83 (!) 153/86 (!) 153/81 (!) 156/82  ?Pulse: 89 91 82 93  ?Resp: '18 18 18 16  '$ ?Temp: 98.3 ?F (36.8 ?C) 99 ?F (37.2 ?C) 99.7 ?F (37.6 ?C) 98.8 ?F (37.1 ?C)  ?TempSrc: Oral Oral Oral Oral  ?SpO2: 96% 95% 94% 95%  ?Weight:      ?Height:      ? ?General: Appear in no distress; no visible Abnormal Neck Mass Or lumps, Conjunctiva normal ?Cardiovascular: S1 and S2 Present, no Murmur, ?Respiratory: good respiratory effort, Bilateral Air entry present and CTA, no Crackles, no wheezes ?Abdomen: Bowel Sound present, Non tender ?Extremities: no Pedal edema ?Neurology: alert and oriented to time, place, and person ?Gait not checked due to patient safety concerns ?Filed Weights  ? 03/03/22 2023  ?Weight: 108.9 kg  ? ?  Condition at discharge: stable ? ?The results of significant diagnostics from this hospitalization (including imaging, microbiology, ancillary and laboratory) are listed below for reference.  ? ?Imaging Studies: ?DG Abdomen 1 View ? ?Result Date: 03/04/2022 ?CLINICAL DATA:  NG tube placement. EXAM: ABDOMEN - 1 VIEW COMPARISON:  CT abdomen  and pelvis 03/03/2022. FINDINGS: Nasogastric tube tip is at the level of the proximal residual contrast is seen in the renal collecting systems. There is mild left hydronephrosis. There is gaseous distention of bowel in the upper abdomen as seen on the prior CT. Body of the stomach. IMPRESSION: 1. Nasogastric tube tip is in the proximal body of the stomach. Electronically Signed   By: Ronney Asters M.D.   On: 03/04/2022 01:04  ? ?CT ABDOMEN PELVIS W CONTRAST ? ?Result Date: 03/03/2022 ?CLINICAL DATA:  Abdominal pain. EXAM: CT ABDOMEN AND PELVIS WITH CONTRAST TECHNIQUE: Multidetector CT imaging of the abdomen and pelvis was performed using the standard protocol following bolus administration of intravenous contrast. RADIATION DOSE REDUCTION: This exam was performed according to the departmental dose-optimization program which includes automated exposure control, adjustment of the mA and/or kV according to patient size and/or use of iterative reconstruction technique. CONTRAST:  176m OMNIPAQUE IOHEXOL 300 MG/ML  SOLN COMPARISON:  None. FINDINGS: Lower chest: Mild cardiomegaly. There is mild posterior atelectasis without infiltrates in the lung bases. Hepatobiliary: 21 cm in length mildly steatotic liver. There is a 2 cm heterogeneously peripherally enhancing lesion in segment 5 which is probably a hemangioma but this needs to be confirmed with hepatic dedicated imaging. Rest of the liver is unremarkable. The gallbladder and bile ducts are unremarkable. Pancreas: No focal abnormality. Spleen: No focal abnormality. Slightly prominent measuring 13.6 cm AP. Adrenals/Urinary Tract: There is no adrenal or renal cortical mass enhancement. There is a small hypodensity in the posterior right kidney which is too small to characterize. There is no renal cortical abnormality elsewhere. On the left, there is delayed cortical contrast enhancement and mild hydroureteronephrosis with 3 x 2 x 4 mm proximal left ureteral stone  superimposing about the tip of the left L4 transverse process. There is a single punctate nonobstructing caliceal stone in the inferior pole right kidney but no intrarenal stones or further right ureteral stones. There are no other ureteral stones. There is no bladder thickening. Stomach/Bowel: The stomach is contracted. The duodenum is unremarkable. There are dilated upper to mid abdominal small bowel segments up to 4 cm caliber which are believed to be jejunal segments. There is a transitional segment which has an apparent kink in the small bowel in the right mid abdomen on series 2 axial image 41 and a short distance from this a separate focal transition on 2:40, suggesting adhesive disease as etiology for small bowel obstruction. The rest of the small bowel is collapsed. The appendix is normal caliber. There are colonic diverticula without evidence of diverticulitis. Vascular/Lymphatic: Mild aortoiliac atherosclerosis. No adenopathy is seen. Reproductive: Status post hysterectomy. No adnexal masses. Other: No abdominal wall incarcerated hernia. There is trace fluid anterior to the left renal vein just above the level of the left renal pelvis with peripelvic and periureteral stranding changes. There is no free air, hemorrhage or abscess. Musculoskeletal: Mild osteopenia and degenerative change lumbar spine. No worrisome regional bone lesion. IMPRESSION: 1. 3 x 2 x 4 mm proximal left ureteral stone with mild obstructive uropathy. Correlate clinically for infectious complication. 2. Trace fluid seen in the retroperitoneum just above the dilated left renal pelvis. This could be  reactive fluid related to obstructive uropathy or could be due to a ureteral leak. This is the only free fluid in the abdomen and pelvis. 3. 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 4. Small right renal hypodensity which is too small to characterize. 5. Intermediate grade small bowel obstruction with 4 cm dilated proximal small bowel  loops and a short-segment with tandem transitions in the right mid abdomen suggesting adhesive disease as etiology, with collapse of the downstream small bowel. 6. Diverticulosis without evidence of diverticulitis. 7.

## 2022-03-30 ENCOUNTER — Ambulatory Visit: Payer: No Typology Code available for payment source | Admitting: *Deleted

## 2022-03-30 ENCOUNTER — Other Ambulatory Visit: Payer: Self-pay | Admitting: Registered Nurse

## 2022-03-30 VITALS — BP 136/86 | Ht 66.5 in | Wt 260.0 lb

## 2022-03-31 LAB — CMP12+LP+TP+TSH+6AC+CBC/D/PLT
ALT: 10 IU/L (ref 0–32)
AST: 20 IU/L (ref 0–40)
Albumin/Globulin Ratio: 1.8 (ref 1.2–2.2)
Albumin: 4.6 g/dL (ref 3.8–4.9)
Alkaline Phosphatase: 82 IU/L (ref 44–121)
BUN/Creatinine Ratio: 19 (ref 9–23)
BUN: 12 mg/dL (ref 6–24)
Basophils Absolute: 0.1 10*3/uL (ref 0.0–0.2)
Basos: 1 %
Bilirubin Total: 0.5 mg/dL (ref 0.0–1.2)
Calcium: 10.4 mg/dL — ABNORMAL HIGH (ref 8.7–10.2)
Chloride: 108 mmol/L — ABNORMAL HIGH (ref 96–106)
Chol/HDL Ratio: 3.9 ratio (ref 0.0–4.4)
Cholesterol, Total: 224 mg/dL — ABNORMAL HIGH (ref 100–199)
Creatinine, Ser: 0.64 mg/dL (ref 0.57–1.00)
EOS (ABSOLUTE): 0.1 10*3/uL (ref 0.0–0.4)
Eos: 2 %
Estimated CHD Risk: 0.8 times avg. (ref 0.0–1.0)
Free Thyroxine Index: 2.3 (ref 1.2–4.9)
GGT: 21 IU/L (ref 0–60)
Globulin, Total: 2.5 g/dL (ref 1.5–4.5)
Glucose: 94 mg/dL (ref 70–99)
HDL: 57 mg/dL (ref >39)
Hematocrit: 43.3 % (ref 34.0–46.6)
Hemoglobin: 14.4 g/dL (ref 11.1–15.9)
Immature Grans (Abs): 0 10*3/uL (ref 0.0–0.1)
Immature Granulocytes: 0 %
Iron: 71 ug/dL (ref 27–159)
LDH: 203 IU/L (ref 119–226)
LDL Chol Calc (NIH): 144 mg/dL — ABNORMAL HIGH (ref 0–99)
Lymphocytes Absolute: 2.1 10*3/uL (ref 0.7–3.1)
Lymphs: 37 %
MCH: 30.6 pg (ref 26.6–33.0)
MCHC: 33.3 g/dL (ref 31.5–35.7)
MCV: 92 fL (ref 79–97)
Monocytes Absolute: 0.6 10*3/uL (ref 0.1–0.9)
Monocytes: 10 %
Neutrophils Absolute: 2.8 10*3/uL (ref 1.4–7.0)
Neutrophils: 50 %
Phosphorus: 3 mg/dL (ref 3.0–4.3)
Platelets: 202 10*3/uL (ref 150–450)
Potassium: 4.5 mmol/L (ref 3.5–5.2)
RBC: 4.7 x10E6/uL (ref 3.77–5.28)
RDW: 12.9 % (ref 11.7–15.4)
Sodium: 141 mmol/L (ref 134–144)
T3 Uptake Ratio: 26 % (ref 24–39)
T4, Total: 9 ug/dL (ref 4.5–12.0)
TSH: 0.831 u[IU]/mL (ref 0.450–4.500)
Total Protein: 7.1 g/dL (ref 6.0–8.5)
Triglycerides: 131 mg/dL (ref 0–149)
Uric Acid: 5 mg/dL (ref 3.0–7.2)
VLDL Cholesterol Cal: 23 mg/dL (ref 5–40)
WBC: 5.6 10*3/uL (ref 3.4–10.8)
eGFR: 102 mL/min/{1.73_m2} (ref >59)

## 2022-03-31 LAB — VITAMIN D 25 HYDROXY (VIT D DEFICIENCY, FRACTURES): Vit D, 25-Hydroxy: 50.8 ng/mL (ref 30.0–100.0)

## 2022-03-31 LAB — HGB A1C W/O EAG: Hgb A1c MFr Bld: 5.5 % (ref 4.8–5.6)

## 2022-04-02 ENCOUNTER — Encounter: Payer: Self-pay | Admitting: Registered Nurse

## 2022-04-02 NOTE — Addendum Note (Signed)
Addended by: Gerarda Fraction A on: 04/02/2022 10:16 PM   Modules accepted: Orders, Level of Service

## 2022-04-06 NOTE — Progress Notes (Signed)
[employer] insurance premium discount evaluation: Labs Drawn by [facility] onsite. Replacements ROI form signed. Tobacco Free Attestation form signed.  Forms placed in paper chart.

## 2022-08-16 ENCOUNTER — Telehealth: Payer: Self-pay | Admitting: Registered Nurse

## 2022-08-16 DIAGNOSIS — R7989 Other specified abnormal findings of blood chemistry: Secondary | ICD-10-CM

## 2022-09-07 ENCOUNTER — Ambulatory Visit: Payer: No Typology Code available for payment source | Admitting: Registered Nurse

## 2022-09-07 DIAGNOSIS — S93602A Unspecified sprain of left foot, initial encounter: Secondary | ICD-10-CM

## 2022-09-08 MED ORDER — ACETAMINOPHEN 500 MG PO TABS
1000.0000 mg | ORAL_TABLET | Freq: Four times a day (QID) | ORAL | 0 refills | Status: AC | PRN
Start: 2022-09-08 — End: 2022-09-11

## 2022-09-08 NOTE — Patient Instructions (Addendum)
Ankle Sprain, Phase I Rehab An ankle sprain is an injury to the ligaments of your ankle. Ankle sprains cause stiffness, loss of motion, and loss of strength. Ask your health care provider which exercises are safe for you. Do exercises exactly as told by your health care provider and adjust them as directed. It is normal to feel mild stretching, pulling, tightness, or discomfort as you do these exercises. Stop right away if you feel sudden pain or your pain gets worse. Do not begin these exercises until told by your health care provider. Stretching and range-of-motion exercises These exercises warm up your muscles and joints and improve the movement and flexibility of your lower leg and ankle. These exercises also help to relieve pain and stiffness. Gastroc and soleus stretch This exercise is also called a calf stretch. It stretches the muscles in the back of the lower leg. These muscles are the gastrocnemius, or gastroc, and the soleus. Sit on the floor with your left / right leg extended. Loop a belt or towel around the ball of your left / right foot. The ball of your foot is on the walking surface, right under your toes. Keep your left / right ankle and foot relaxed and keep your knee straight while you use the belt or towel to pull your foot toward you. You should feel a gentle stretch behind your calf or knee in your gastroc muscle. Hold this position for __5-15________ seconds, then release to the starting position. Repeat the exercise with your knee bent. You can put a pillow or a rolled bath towel under your knee to support it. You should feel a stretch deep in your calf in the soleus muscle or at your Achilles tendon. Repeat ___3_______ times. Complete this exercise ___2_______ times a day. Ankle alphabet  Sit with your left / right leg supported at the lower leg. Do not rest your foot on anything. Make sure your foot has room to move freely. Think of your left / right foot as a  paintbrush. Move your foot to trace each letter of the alphabet in the air. Keep your hip and knee still while you trace. Make the letters as large as you can without feeling discomfort. Trace every letter from A to Z. Repeat _____5_____ times. Complete this exercise ___2_______ times a day. Strengthening exercises These exercises build strength and endurance in your ankle and lower leg. Endurance is the ability to use your muscles for a long time, even after they get tired. Ankle dorsiflexion  Secure a rubber exercise band or tube to an object, such as a table leg, that will stay still when the band is pulled. Secure the other end around your left / right foot. Sit on the floor facing the object, with your left / right leg extended. The band or tube should be slightly tense when your foot is relaxed. Slowly bring your foot toward you, bringing the top of your foot toward your shin (dorsiflexion), and pulling the band tighter. Hold this position for ___5-15_______ seconds. Slowly return your foot to the starting position. Repeat ____3______ times. Complete this exercise ___2_______ times a day. Ankle plantar flexion  Sit on the floor with your left / right leg extended. Loop a rubber exercise tube or band around the ball of your left / right foot. The ball of your foot is on the walking surface, right under your toes. Hold the ends of the band or tube in your hands. The band or tube should be slightly  tense when your foot is relaxed. Slowly point your foot and toes downward to tilt the top of your foot away from your shin (plantar flexion). Hold this position for _____5-15_____ seconds. Slowly return your foot to the starting position. Repeat ______3____ times. Complete this exercise ___2_______ times a day. Ankle eversion Sit on the floor with your legs straight out in front of you. Loop a rubber exercise band or tube around the ball of your left / right foot. The ball of your foot is on  the walking surface, right under your toes. Hold the ends of the band in your hands, or secure the band to a stable object. The band or tube should be slightly tense when your foot is relaxed. Slowly push your foot outward, away from your other leg (eversion). Hold this position for _5-15_________ seconds. Slowly return your foot to the starting position. Repeat _____3_____ times. Complete this exercise _____2_____ times a day. This information is not intended to replace advice given to you by your health care provider. Make sure you discuss any questions you have with your health care provider. Document Revised: 12/23/2020 Document Reviewed: 12/25/2020 Elsevier Patient Education  [place]. Ankle Sprain  An ankle sprain is a stretch or tear in a ligament in the ankle. Ligaments are tissues that connect bones to each other. The two most common types of ankle sprains are: Inversion sprain. This happens when the foot turns inward and the ankle rolls outward. It affects the ligament on the outside of the foot (lateral ligament). Eversion sprain. This happens when the foot turns outward and the ankle rolls inward. It affects the ligament on the inner side of the foot (medial ligament). What are the causes? This condition is often caused by accidentally rolling or twisting the ankle. What increases the risk? You are more likely to develop this condition if you play sports. What are the signs or symptoms? Symptoms of this condition include: Pain in your ankle. Swelling. Bruising. This may develop right after you sprain your ankle or 1-2 days later. Trouble standing or walking, especially when you turn or change directions. How is this diagnosed? This condition is diagnosed with: A physical exam. During the exam, your health care provider will press on certain parts of your foot and ankle and try to move them in certain ways. X-ray imaging. These may be taken to see how severe the  sprain is and to check for broken bones. How is this treated? This condition may be treated with: A brace or splint. This is used to keep the ankle from moving until it heals. An elastic bandage. This is used to support the ankle. Crutches. Pain medicine. Surgery. This may be needed if the sprain is severe. Physical therapy. This may help to improve the range of motion in the ankle. Follow these instructions at home: If you have a brace or a splint: Wear the brace or splint as told by your health care provider. Remove it only as told by your health care provider. Loosen the brace or splint if your toes tingle, become numb, or turn cold and blue. Keep the brace or splint clean. If the brace or splint is not waterproof: Do not let it get wet. Cover it with a watertight covering when you take a bath or a shower. If you have an elastic bandage (dressing): Remove it to shower or bathe. Try not to move your ankle much, but wiggle your toes from time to time. This helps to  prevent swelling. Adjust the dressing to make it more comfortable if it feels too tight. Loosen the dressing if you have numbness or tingling in your foot, or if your foot becomes cold and blue. Managing pain, stiffness, and swelling  Take over-the-counter and prescription medicines only as told by your health care provider. For 2-3 days, keep your ankle raised (elevated) above the level of your heart as much as possible. If directed, put ice on the injured area: If you have a removable brace or splint, remove it as told by your health care provider. Put ice in a plastic bag. Place a towel between your skin and the bag. Leave the ice on for 20 minutes, 2-3 times a day. General instructions Rest your ankle. Do not use the injured limb to support your body weight until your health care provider says that you can. Use crutches as told by your health care provider. Do not use any products that contain nicotine or tobacco,  such as cigarettes, e-cigarettes, and chewing tobacco. If you need help quitting, ask your health care provider. Keep all follow-up visits as told by your health care provider. This is important. Contact a health care provider if: You have rapidly increasing bruising or swelling. Your pain is not relieved with medicine. Get help right away if: Your foot or toes become numb or blue. You have severe pain that gets worse. Summary An ankle sprain is a stretch or tear in a ligament in the ankle. Ligaments are tissues that connect bones to each other. This condition is often caused by accidentally rolling or twisting the ankle. Symptoms include pain, swelling, bruising, and trouble walking. To relieve pain and swelling, put ice on the affected ankle, raise your ankle above the level of your heart, and use an elastic bandage. Keep all follow-up visits as told by your health care provider. This is important. This information is not intended to replace advice given to you by your health care provider. Make sure you discuss any questions you have with your health care provider. Document Revised: 12/23/2020 Document Reviewed: 12/25/2020 Elsevier Patient Education  [place] Sprain  A foot sprain is an injury to one of the ligaments in the feet. Ligaments are strong tissues that connect bones to each other. The ligament can be stretched too much. In some cases, it may tear. A tear can be either partial or complete. The severity of the sprain depends on how much of the ligament was damaged or torn. What are the causes? This condition is usually caused by suddenly twisting or pivoting your foot. What increases the risk? You are more likely to develop this condition if: You play a sport, such as basketball or football. You exercise or play a sport without first warming up your muscles. You start a new workout or sport. You suddenly increase how long or hard you exercise or play a  sport. You have injured your foot or ankle before. What are the signs or symptoms? Symptoms of this condition start soon after an injury and include: Pain, especially in the arch of your foot. Bruising. Swelling. Being unable to walk or use your foot to support body weight. How is this diagnosed? This condition is diagnosed with a medical history and physical exam. You may also have imaging tests, such as: X-rays to check for broken bones (fractures). An MRI to see if the ligament is torn. How is this treated? Treatment for this condition depends on the severity of the sprain.  Mild sprains and major sprains can be treated with: Rest, ice, pressure (compression), and elevation (RICE). Elevation means raising your injured foot. Keeping your foot in a fixed position (immobilization) for a period of time. This is done if your ligament is overstretched or partially torn. Your health care provider will apply a bandage, splint, or walking boot to keep your foot from moving until it heals. Using crutches or a scooter for a few weeks to avoid bearing weight on your foot while it is healing. Physical therapy exercises to improve movement and strength in your foot. Major sprains may also be treated with: Surgery. This is done if your ligament is fully torn and a procedure is needed to reconnect it to the bone. A cast or splint. This will be needed after surgery. A cast or splint will need to stay on your foot while it heals. Follow these instructions at home: If you have a bandage, splint, or boot: Wear it as told by your health care provider. Remove it only as told by your health care provider. Loosen it if your toes tingle, become numb, or turn cold and blue. Keep it clean and dry. If you have a cast: Do not put pressure on any part of the cast until it is fully hardened. This may take several hours. Do not stick anything inside the cast to scratch your skin. Doing that increases your risk for  infection. Check the skin around the cast every day. Tell your health care provider about any concerns. You may put lotion on dry skin around the edges of the cast. Do not put lotion on the skin underneath the cast. Keep it clean and dry. Bathing Do not take baths, swim, or use a hot tub until your health care provider approves. Ask your health care provider if you may take showers. You may only be allowed to take sponge baths. If the bandage, splint, boot, or cast is not waterproof: Do not let it get wet. Cover it with a watertight covering when you take a bath or shower. Managing pain, stiffness, and swelling  If directed, put ice on the injured area. To do this: If you have a removable bandage, splint, or boot, remove it as told by your health care provider. Put ice in a plastic bag. Place a towel between your skin and the bag, or between your cast and the bag. Leave the ice on for 20 minutes, 2-3 times per day. Remove the ice if your skin turns bright red. This is very important. If you cannot feel pain, heat, or cold, you have a greater risk of damage to the area. Move your toes often to reduce stiffness and swelling. Elevate the injured area above the level of your heart while you are sitting or lying down. Activity Do not use the injured foot to support your body weight until your health care provider says that you can. Use crutches or a scooter as told by your health care provider. Ask your health care provider what activities are safe for you. Do exercises as told by your health care provider. Gradually increase how much and how far you walk until your health care provider says it is safe to return to full activity. Driving Ask your health care provider if the medicine prescribed to you requires you to avoid driving or using machinery. Ask your health care provider when it is safe to drive if you have a bandage, splint, boot, or cast on your foot. General instructions Take  over-the-counter and prescription medicines only as told by your health care provider. When you can walk without pain, wear supportive shoes that have stiff soles. Do not wear flip-flops. Do not walk barefoot. Keep all follow-up visits. This is important. Contact a health care provider if: Medicine does not help your pain. Your bruising or swelling gets worse or does not get better with treatment. Your splint, boot, or cast is damaged. Get help right away if: You develop severe numbness or tingling in your foot. Your foot turns blue, white, or gray, and it feels cold. Summary A foot sprain is an injury to one of the ligaments in the feet. Ligaments are strong tissues that connect bones to each other. You may need a bandage, splint, boot, or cast to support your foot while it heals. Sometimes, surgery may be needed. You may need physical therapy exercises to improve movement and strength in your foot. This information is not intended to replace advice given to you by your health care provider. Make sure you discuss any questions you have with your health care provider. Document Revised: 02/22/2020 Document Reviewed: 02/22/2020 Elsevier Patient Education  [place].

## 2022-09-08 NOTE — Progress Notes (Signed)
Subjective:    Patient ID: Sara Cook, female    DOB: [birth year], 56 y.o.   MRN: [id number]  56y/o caucasian female established patient here for evaluation left foot pain.  Slipped on ladder and hit foot.  Initially not much pain yesterday 09/06/22 but today at times 5/10, mild swelling left shoe feels tight.  Pain worse with climbing ladder today.  Denied bruising/wound.  Pain with pointing toes.  Patient has tried ibuprofen/ice/epsom salt soak and rest overnight.      Review of Systems  Constitutional:  Negative for activity change, appetite change, chills, diaphoresis, fatigue and fever.  HENT:  Negative for trouble swallowing and voice change.   Eyes:  Negative for photophobia and visual disturbance.  Respiratory:  Negative for cough and shortness of breath.   Cardiovascular:  Negative for chest pain and leg swelling.  Gastrointestinal:  Negative for diarrhea, nausea and vomiting.  Genitourinary:  Negative for difficulty urinating.  Musculoskeletal:  Positive for gait problem and myalgias. Negative for neck pain and neck stiffness.  Skin:  Negative for color change, pallor, rash and wound.  Allergic/Immunologic: Negative for food allergies.  Neurological:  Negative for tremors, seizures, syncope, speech difficulty, weakness and numbness.  Hematological:  Negative for adenopathy. Does not bruise/bleed easily.  Psychiatric/Behavioral:  Negative for agitation, confusion and sleep disturbance.        Objective:   Physical Exam Vitals and nursing note reviewed.  Constitutional:      General: She is awake. She is not in acute distress.    Appearance: Normal appearance. She is well-developed and well-groomed. She is obese. She is not ill-appearing, toxic-appearing or diaphoretic.  HENT:     Head: Normocephalic and atraumatic.     Jaw: There is normal jaw occlusion.     Salivary Glands: Right salivary gland is not diffusely enlarged. Left salivary gland is not diffusely  enlarged.     Right Ear: Hearing and external ear normal.     Left Ear: Hearing and external ear normal.     Nose: Nose normal. No congestion or rhinorrhea.     Mouth/Throat:     Lips: Pink. No lesions.     Mouth: Mucous membranes are moist.     Pharynx: Oropharynx is clear.  Eyes:     General: Lids are normal. Vision grossly intact. Gaze aligned appropriately. No scleral icterus.       Right eye: No discharge.        Left eye: No discharge.     Extraocular Movements: Extraocular movements intact.     Conjunctiva/sclera: Conjunctivae normal.     Pupils: Pupils are equal, round, and reactive to light.  Neck:     Trachea: Trachea normal.  Cardiovascular:     Rate and Rhythm: Normal rate and regular rhythm.     Pulses: Normal pulses.          Radial pulses are 2+ on the right side and 2+ on the left side.       Dorsalis pedis pulses are 2+ on the right side and 2+ on the left side.       Posterior tibial pulses are 2+ on the right side and 2+ on the left side.     Heart sounds: Normal heart sounds, S1 normal and S2 normal.  Pulmonary:     Effort: Pulmonary effort is normal.     Breath sounds: Normal breath sounds and air entry. No stridor or transmitted upper airway sounds. No wheezing.  Comments: Spoke full sentences without difficulty; no cough observed in exam room Abdominal:     Palpations: Abdomen is soft.  Musculoskeletal:        General: Swelling, tenderness, deformity and signs of injury present. Normal range of motion.     Cervical back: Normal range of motion and neck supple. No rigidity.     Right lower leg: No swelling, deformity, lacerations, tenderness or bony tenderness. No edema.     Left lower leg: No swelling, deformity, lacerations, tenderness or bony tenderness. No edema.     Right ankle: No swelling, deformity, ecchymosis or lacerations. No tenderness. Normal range of motion. Anterior drawer test negative.     Left ankle: No swelling, deformity, ecchymosis or  lacerations. No tenderness. Normal range of motion. Anterior drawer test negative.     Right foot: Normal range of motion and normal capillary refill. No deformity, bunion, Charcot foot, foot drop, laceration, tenderness, bony tenderness or crepitus.     Left foot: Normal range of motion and normal capillary refill. Swelling and tenderness present. No deformity, bunion, Charcot foot, foot drop, laceration, bony tenderness or crepitus.       Feet:  Feet:     Right foot:     Skin integrity: Callus present. No ulcer, blister, skin breakdown, erythema, warmth, dry skin or fissure.     Toenail Condition: Right toenails are normal.     Left foot:     Skin integrity: Callus present. No ulcer, blister, skin breakdown, erythema, warmth, dry skin or fissure.     Toenail Condition: Left toenails are normal.     Comments: Wearing sketchers sneakers treads not worn off appear in good condition/newer Lymphadenopathy:     Head:     Right side of head: No submandibular or preauricular adenopathy.     Left side of head: No submandibular or preauricular adenopathy.     Cervical: No cervical adenopathy.     Right cervical: No superficial cervical adenopathy.    Left cervical: No superficial cervical adenopathy.  Skin:    General: Skin is warm and dry.     Capillary Refill: Capillary refill takes less than 2 seconds.     Coloration: Skin is not ashen, cyanotic, jaundiced, mottled, pale or sallow.     Findings: No abrasion, abscess, acne, bruising, burn, ecchymosis, erythema, signs of injury, laceration, lesion, petechiae, rash or wound.     Nails: There is no clubbing.  Neurological:     General: No focal deficit present.     Mental Status: She is alert and oriented to person, place, and time. Mental status is at baseline.     Cranial Nerves: No cranial nerve deficit.     Motor: Motor function is intact. No weakness, tremor, abnormal muscle tone or seizure activity.     Coordination: Coordination is  intact. Coordination normal.     Gait: Gait is intact. Gait normal.     Comments: In/out of chair without difficulty; gait sure and steady in clinic; bilateral hand grasp equal 5/5  Psychiatric:        Attention and Perception: Attention and perception normal.        Mood and Affect: Mood and affect normal.        Speech: Speech normal.        Behavior: Behavior normal. Behavior is cooperative.        Thought Content: Thought content normal.        Cognition and Memory: Cognition and memory normal.  Judgment: Judgment normal.     Safety Officer Rachel Bo with questions reviewed patient job responsibilities with [employer], patient and safety office.  Agreed with safety office ladders this week place patient at higher risk reoccurrence avoid this week.  Supervisor able to accommodate.  If new or worsening symptoms patient to notify clinic staff.  If needs further work restrictions will need to see [place] provider.  Patient/supervisor/safety office all in agreement.      Assessment & Plan:   A-Foot sprain left initial encounter  P-Patient was instructed to rest, ice and elevate the ankle/foot as much as possible when at home.  Bone contusion/sprain can take weeks to heal. Has reusable ice pack at home.   Epsom salt soaks daily may be helpful for swelling and pain. Medications as directed. Call or return to clinic same day/UC if these symptoms worsen or fail to improve as anticipated especially if extremity worsening swelling/cold/blue.  Discussed if worsening pain I recommend xray foot.  At this time pain from soft tissue on exam.  Discussed if work restrictions needed will need to discuss with supervisor as I cannot write for work restrictions per contract and would need to schedule patient appt at [employer] office.  Activity as tolerated and work on ROM exercises. Patient is to take NSAIDS as needed patient prefers motrin and does not need refill today. Given  tylenol '1000mg'$  po QID prn pain UD x4 from clinic stock today also. Discussed at risk to reinjure ankle over the next year and to wear supportive footwear/ankle sleeve/ace bandage. Fitted and distributed ankle/foot elastic sleeve from clinic stock size XL patient shoe size 11.  Follow up if symptoms worsen or persist greater than 6-8 weeks for re-evaluation should gradually improve. Elevate left foot when sitting, wear compression sock/ankle sleeve until swelling/pain resolved.  Hand wash air dry do not put in washing machine/dryer.    Patient reported ankle support feels good and it has kept swelling down.  Cryotherapy 15 minutes TID prn pain/swelling.  Avoid high impact exercises (running/jumping).  May apply biofreeze gel QID prn pain   OTC Ibuprofen '800mg'$  po TID prn pain take with food and can cut tablet in half if '800mg'$  causing stomach discomfort.  Consider crutches/knee scooter.  Patient refused at this time.[facility] handouts on foot sprain, ankle sprain and rehab exercises printed and given to patient.  Patient verbalized understanding information/instructions, agreed with plan of care and had no further questions at this time.

## 2022-09-13 NOTE — Telephone Encounter (Signed)
Patient reminded schedule follow up with endocrinology.  Foot/ankle pain follow up patient reported compression sleeve helping.  Removing at night to sleep. Reiterated plan of care re: ice/elevated/tylenol or ibuprofen and compression sleeve while at work for the next 2 weeks. Patient verbalized understanding information/instructions, agreed with plan of care and had no further questions at this time.

## 2022-09-15 NOTE — Telephone Encounter (Signed)
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  Stated over the weekend rested and ankle/foot felt better didn't swell or need compression.  At work feels better to wear compression.  Sometimes certain movements will cause short sharp pain but it then resolves quickly.  Denied any rash/bruising/fever/chills/limping.  Feels she is able to complete all work duties and had no further questions at this time.  Encouraged her to continue compression ankle/foot sleeve.  Elevate and ice after work.  Take tylenol '1000mg'$  po QID prn pain or ibuprofen 200-'800mg'$  po q8h prn pain.  Patient verbalized understanding information/instructions, agreed with plan of care and had no further questions at this time.

## 2022-09-22 NOTE — Telephone Encounter (Signed)
Patient seen in workcenter gait sure and steady; skin warm dry and pink no limp observed wearing sneakers sketchers with ankle foot compression sleeve.  Patient reported pain has decreased and improving but still plans to wear compression sleeve this week.  Encouraged patient to do so along with cryotherapy prn if swelling after work elevate/ice.  Patient denied need for appt at this time or further questions/concerns.  Encounter closed.

## 2022-10-12 ENCOUNTER — Ambulatory Visit: Payer: No Typology Code available for payment source | Admitting: Occupational Medicine

## 2022-10-12 DIAGNOSIS — H5789 Other specified disorders of eye and adnexa: Secondary | ICD-10-CM

## 2022-10-12 NOTE — Progress Notes (Signed)
Patient complains of right eye redness and itching. [facility] NP said to use refresh tears two drops tid if worse seen me tomorrow. If not better by Thursday schedule with NP.

## 2022-10-20 ENCOUNTER — Telehealth: Payer: Self-pay | Admitting: *Deleted

## 2022-10-20 ENCOUNTER — Ambulatory Visit: Payer: No Typology Code available for payment source | Admitting: Obstetrics & Gynecology

## 2022-10-20 NOTE — Telephone Encounter (Signed)
Called to reschedule the patient's appt for today to an earlier time, appt moved to 1/24

## 2022-11-29 ENCOUNTER — Ambulatory Visit: Payer: No Typology Code available for payment source | Admitting: Occupational Medicine

## 2022-11-29 VITALS — BP 138/88 | HR 90 | Temp 97.5°F

## 2022-11-29 DIAGNOSIS — J069 Acute upper respiratory infection, unspecified: Secondary | ICD-10-CM

## 2022-11-29 NOTE — Progress Notes (Signed)
Patient reports this am stuffy right ear and runny nose. Slight sore throat. Husband recently sick. Patient denies any fever. Right Ear appears to have fluid around tympanic membrane. Patient given Advil cold flu cough drops saline spray. Probably viral. If worsen or changes to come back to clinic. Covid test neg. Patient verbalized and understands care plan.

## 2022-12-07 ENCOUNTER — Encounter: Payer: Self-pay | Admitting: Obstetrics & Gynecology

## 2022-12-08 ENCOUNTER — Inpatient Hospital Stay
Payer: No Typology Code available for payment source | Attending: Obstetrics & Gynecology | Admitting: Obstetrics & Gynecology

## 2022-12-08 ENCOUNTER — Other Ambulatory Visit: Payer: Self-pay

## 2022-12-08 ENCOUNTER — Encounter: Payer: Self-pay | Admitting: Obstetrics & Gynecology

## 2022-12-08 VITALS — BP 139/87 | HR 112 | Temp 98.3°F | Resp 20 | Wt 265.0 lb

## 2022-12-08 DIAGNOSIS — Z8542 Personal history of malignant neoplasm of other parts of uterus: Secondary | ICD-10-CM

## 2022-12-08 DIAGNOSIS — C541 Malignant neoplasm of endometrium: Secondary | ICD-10-CM

## 2022-12-08 NOTE — Progress Notes (Signed)
Follow Up Note: Gyn-Onc  Sara Cook 57 y.o. female  CC: F/U visit   HPI: The oncology history was reviewed.  Interval History:  She denies any vaginal bleeding, abdominal/pelvic pain, cough, lethargy or increasing abdominal girth.     Review of Systems  Review of Systems  Constitutional:  Negative for malaise/fatigue and weight loss.  Respiratory:  Negative for shortness of breath and wheezing.   Cardiovascular:  Negative for chest pain and leg swelling.  Gastrointestinal:  Negative for abdominal pain, blood in stool, constipation, nausea and vomiting.  Genitourinary:  Negative for dysuria, frequency, hematuria and urgency.  Musculoskeletal:  Negative for joint pain and myalgias.  Neurological:  Negative for weakness.  Psychiatric/Behavioral:  Negative for depression. The patient does not have insomnia.    Current medications, allergy, social history, past surgical history, past medical history, family history were all reviewed.   Vitals:  BP 139/87   Pulse (!) 112   Temp 98.3 F (36.8 C)   Resp 20   Wt 265 lb (120.2 kg)   SpO2 99%   BMI 42.13 kg/m    Physical Exam:  Physical Exam Exam conducted with a chaperone present.  Constitutional:      General: She is not in acute distress. Cardiovascular:     Rate and Rhythm: Normal rate and regular rhythm.  Pulmonary:     Effort: Pulmonary effort is normal.     Breath sounds: Normal breath sounds. No wheezing or rhonchi.  Abdominal:     Palpations: Abdomen is soft.     Tenderness: There is no abdominal tenderness. There is no right CVA tenderness or left CVA tenderness.     Hernia: No hernia is present.  Genitourinary:    General: Normal vulva.     Urethra: No urethral lesion.     Vagina: No lesions. No vaginal bleeding.  Musculoskeletal:     Cervical back: Neck supple.     Right lower leg: No edema.     Left lower leg: No edema.  Lymphadenopathy:     Upper Body:     Right upper body: No supraclavicular  adenopathy.     Left upper body: No supraclavicular adenopathy.     Lower Body: No right inguinal adenopathy. No left inguinal adenopathy.  Skin:    Findings: No rash.  Neurological:     Mental Status: She is oriented to person, place, and time.   Assessment/Plan:  Endometrial cancer [facility]) [name]  is a 57 y.o.  year old P2 with stage IB grade 1 endometrial cancer, MMR normal/MSI stable. S/p staging on 03/17/21. Negative symptom review, normal exam.  No evidence of recurrence    >continue follow-up every 6 months for 5 years in accordance with NCCN guideline    I personally spent 25 minutes face-to-face and non-face-to-face in the care of this patient, which includes all pre, intra, and post visit time on the date of service.   Lahoma Crocker, MD

## 2022-12-08 NOTE — Assessment & Plan Note (Addendum)
Sara Cook  is a 57 y.o.  year old P2 with stage IB grade 1 endometrial cancer, MMR normal/MSI stable. S/p staging on 03/17/21. Negative symptom review, normal exam.  No evidence of recurrence    >continue follow-up every 6 months for 5 years in accordance with NCCN guideline

## 2022-12-08 NOTE — Patient Instructions (Addendum)
Please call at the end of the summer (July/August) to return in 11/24.

## 2023-02-02 ENCOUNTER — Encounter: Payer: Self-pay | Admitting: Registered Nurse

## 2023-02-02 ENCOUNTER — Telehealth: Payer: Self-pay | Admitting: Registered Nurse

## 2023-02-02 DIAGNOSIS — Z0000 Encounter for general adult medical examination without abnormal findings: Secondary | ICD-10-CM

## 2023-02-02 NOTE — Telephone Encounter (Signed)
Be Well due May 2024 for 2025 insurance discount.  Overdue for follow up labs and endocrinology follow up please schedule and remind patient to schedule with her specialist.

## 2023-03-10 ENCOUNTER — Other Ambulatory Visit: Payer: No Typology Code available for payment source | Admitting: Occupational Medicine

## 2023-03-10 DIAGNOSIS — Z0000 Encounter for general adult medical examination without abnormal findings: Secondary | ICD-10-CM

## 2023-03-10 NOTE — Progress Notes (Signed)
Lab drawn tolerated well no issues noted.   

## 2023-03-11 LAB — CMP12+LP+TP+TSH+6AC+CBC/D/PLT
ALT: 10 IU/L (ref 0–32)
AST: 26 IU/L (ref 0–40)
Albumin/Globulin Ratio: 2 (ref 1.2–2.2)
Albumin: 4.7 g/dL (ref 3.8–4.9)
Alkaline Phosphatase: 79 IU/L (ref 44–121)
BUN/Creatinine Ratio: 12 (ref 9–23)
BUN: 9 mg/dL (ref 6–24)
Basophils Absolute: 0 10*3/uL (ref 0.0–0.2)
Basos: 1 %
Bilirubin Total: 0.5 mg/dL (ref 0.0–1.2)
Calcium: 10.5 mg/dL — ABNORMAL HIGH (ref 8.7–10.2)
Chloride: 104 mmol/L (ref 96–106)
Chol/HDL Ratio: 3.7 ratio (ref 0.0–4.4)
Cholesterol, Total: 231 mg/dL — ABNORMAL HIGH (ref 100–199)
Creatinine, Ser: 0.75 mg/dL (ref 0.57–1.00)
EOS (ABSOLUTE): 0.1 10*3/uL (ref 0.0–0.4)
Eos: 2 %
Estimated CHD Risk: 0.7 times avg. (ref 0.0–1.0)
Free Thyroxine Index: 2.3 (ref 1.2–4.9)
GGT: 20 IU/L (ref 0–60)
Globulin, Total: 2.3 g/dL (ref 1.5–4.5)
Glucose: 101 mg/dL — ABNORMAL HIGH (ref 70–99)
HDL: 62 mg/dL (ref >39)
Hematocrit: 44.1 % (ref 34.0–46.6)
Hemoglobin: 14.7 g/dL (ref 11.1–15.9)
Immature Grans (Abs): 0 10*3/uL (ref 0.0–0.1)
Immature Granulocytes: 0 %
Iron: 90 ug/dL (ref 27–159)
LDH: 191 IU/L (ref 119–226)
LDL Chol Calc (NIH): 143 mg/dL — ABNORMAL HIGH (ref 0–99)
Lymphocytes Absolute: 2 10*3/uL (ref 0.7–3.1)
Lymphs: 42 %
MCH: 31.1 pg (ref 26.6–33.0)
MCHC: 33.3 g/dL (ref 31.5–35.7)
MCV: 93 fL (ref 79–97)
Monocytes Absolute: 0.5 10*3/uL (ref 0.1–0.9)
Monocytes: 10 %
Neutrophils Absolute: 2.2 10*3/uL (ref 1.4–7.0)
Neutrophils: 45 %
Phosphorus: 2.8 mg/dL — ABNORMAL LOW (ref 3.0–4.3)
Platelets: 204 10*3/uL (ref 150–450)
Potassium: 4.2 mmol/L (ref 3.5–5.2)
RBC: 4.73 x10E6/uL (ref 3.77–5.28)
RDW: 12.6 % (ref 11.7–15.4)
Sodium: 139 mmol/L (ref 134–144)
T3 Uptake Ratio: 25 % (ref 24–39)
T4, Total: 9.3 ug/dL (ref 4.5–12.0)
TSH: 0.974 u[IU]/mL (ref 0.450–4.500)
Total Protein: 7 g/dL (ref 6.0–8.5)
Triglycerides: 148 mg/dL (ref 0–149)
Uric Acid: 5.5 mg/dL (ref 3.0–7.2)
VLDL Cholesterol Cal: 26 mg/dL (ref 5–40)
WBC: 4.8 10*3/uL (ref 3.4–10.8)
eGFR: 92 mL/min/{1.73_m2} (ref >59)

## 2023-03-11 LAB — HEMOGLOBIN A1C
Est. average glucose Bld gHb Est-mCnc: 108 mg/dL
Hgb A1c MFr Bld: 5.4 % (ref 4.8–5.6)

## 2023-03-11 LAB — VITAMIN D 25 HYDROXY (VIT D DEFICIENCY, FRACTURES): Vit D, 25-Hydroxy: 51.8 ng/mL (ref 30.0–100.0)

## 2023-03-12 ENCOUNTER — Other Ambulatory Visit: Payer: Self-pay | Admitting: Registered Nurse

## 2023-03-12 ENCOUNTER — Encounter: Payer: Self-pay | Admitting: Registered Nurse

## 2023-03-29 ENCOUNTER — Ambulatory Visit: Payer: No Typology Code available for payment source | Admitting: Occupational Medicine

## 2023-03-29 VITALS — BP 132/84 | Ht 68.0 in | Wt 259.2 lb

## 2023-03-29 DIAGNOSIS — Z0000 Encounter for general adult medical examination without abnormal findings: Secondary | ICD-10-CM

## 2023-03-29 NOTE — Progress Notes (Signed)
Be well insurance premium discount evaluation: Met  Epic reviewed by RN Bess Kinds and transcribed and reviewed with patient. Scheduled follow up lab. Tobacco attestation signed. [facility] ROI formed signed. Forms placed in the chart.   Patient given handouts for [facility] pharmacies and discount drugs list,MyChart, Tele doc setup, Tele doc [address], [place] counseling and [employer] counseling.  What to do for infectious illness protocol. Given handout for list of medications that can be filled at [facility]. Given Clinic hours and Clinic Email.

## 2023-04-09 NOTE — Telephone Encounter (Signed)
Patient had labs drawn 03/10/23 see results note.  Be Well discount paperwork signed 03/29/23 and patient seen in clinic reminded overdue for endocrinology follow up.  Patient does not want to schedule follow up as she did not feel they did anything more than primary care provider except she has large medical bill.  Encouraged patient to follow up with PCM/endocrinologist for parathyroidism.

## 2023-05-04 ENCOUNTER — Other Ambulatory Visit: Payer: No Typology Code available for payment source | Admitting: Occupational Medicine

## 2023-05-04 VITALS — BP 151/96 | HR 90

## 2023-05-04 DIAGNOSIS — Z0130 Encounter for examination of blood pressure without abnormal findings: Secondary | ICD-10-CM

## 2023-05-04 NOTE — Progress Notes (Signed)
Patient had Ca check June 7 at endocrinology. Normal 10.2. No need to draw today. Check BP per patient request since elevated at last MD visit.

## 2023-05-09 ENCOUNTER — Encounter: Payer: Self-pay | Admitting: Registered Nurse

## 2023-05-09 ENCOUNTER — Telehealth: Payer: Self-pay | Admitting: Registered Nurse

## 2023-05-09 ENCOUNTER — Ambulatory Visit: Payer: No Typology Code available for payment source | Admitting: Occupational Medicine

## 2023-05-09 VITALS — BP 149/89 | HR 98

## 2023-05-09 DIAGNOSIS — Z0130 Encounter for examination of blood pressure without abnormal findings: Secondary | ICD-10-CM

## 2023-05-09 NOTE — Telephone Encounter (Signed)
Patient with elevated calcium on Be Well 2025 labs and normal vitamin D instructed to schedule endocrinology follow up as overdue at that time.  Noted had appt 04/22/23 and follow up labs.  Next labs due Dec 2024 per endocrinology.

## 2023-05-16 ENCOUNTER — Ambulatory Visit: Payer: No Typology Code available for payment source | Admitting: Occupational Medicine

## 2023-05-16 VITALS — BP 141/93 | HR 88

## 2023-05-16 DIAGNOSIS — Z0130 Encounter for examination of blood pressure without abnormal findings: Secondary | ICD-10-CM

## 2023-05-16 NOTE — Progress Notes (Signed)
Working on cutting down salt intake. PCP Wednesday.

## 2023-05-18 DIAGNOSIS — R030 Elevated blood-pressure reading, without diagnosis of hypertension: Secondary | ICD-10-CM | POA: Insufficient documentation

## 2023-05-18 DIAGNOSIS — E210 Primary hyperparathyroidism: Secondary | ICD-10-CM | POA: Insufficient documentation

## 2023-05-18 DIAGNOSIS — Z8542 Personal history of malignant neoplasm of other parts of uterus: Secondary | ICD-10-CM | POA: Insufficient documentation

## 2023-05-25 ENCOUNTER — Ambulatory Visit: Payer: No Typology Code available for payment source | Admitting: Occupational Medicine

## 2023-05-25 VITALS — BP 138/87 | HR 85

## 2023-05-25 DIAGNOSIS — Z0130 Encounter for examination of blood pressure without abnormal findings: Secondary | ICD-10-CM

## 2023-05-25 NOTE — Progress Notes (Signed)
Patient doing well BP decrease some from last week. Monitoring diet and trying to manage stress.

## 2023-05-30 ENCOUNTER — Ambulatory Visit: Payer: No Typology Code available for payment source | Admitting: Occupational Medicine

## 2023-05-30 DIAGNOSIS — M79642 Pain in left hand: Secondary | ICD-10-CM

## 2023-05-30 DIAGNOSIS — M7989 Other specified soft tissue disorders: Secondary | ICD-10-CM

## 2023-05-30 NOTE — Progress Notes (Signed)
Patient reports having swelling to hand and pain for couple months. Has ortho appt tomorrow at 215 pm. Patient has been using Biofreeze only helps some. Noted some swelling to hands. Patient reports pain is 6-7 when using them to polish or pull items. Mostly like the repetitive motions causing pain. Schedule appt with Sara Fermo Np in the am. Patient given Biofreeze and tylenol today.

## 2023-05-31 ENCOUNTER — Encounter: Payer: Self-pay | Admitting: Registered Nurse

## 2023-05-31 ENCOUNTER — Ambulatory Visit: Payer: No Typology Code available for payment source | Admitting: Registered Nurse

## 2023-05-31 VITALS — BP 125/88 | HR 89 | Resp 18

## 2023-05-31 DIAGNOSIS — M7989 Other specified soft tissue disorders: Secondary | ICD-10-CM

## 2023-05-31 DIAGNOSIS — M6530 Trigger finger, unspecified finger: Secondary | ICD-10-CM

## 2023-05-31 DIAGNOSIS — M65342 Trigger finger, left ring finger: Secondary | ICD-10-CM

## 2023-05-31 DIAGNOSIS — M79641 Pain in right hand: Secondary | ICD-10-CM

## 2023-05-31 NOTE — Patient Instructions (Signed)
Hand Pain Hand pain can make it hard to do daily activities. Many things can cause hand pain. Some common causes are: Injuries. These may include: Broken bones (fractures)and cuts. Overuse injuries from doing the same movements many times (repetitive activity). Arthritis. Lumps in the tendons or joints of the hand and wrist (ganglion cysts). Nerve compression syndromes (carpal tunnel syndrome). Inflammation of the tendons (tendinitis). Infection. Follow these instructions at home: Managing pain, stiffness, and swelling     Take over-the-counter and prescription medicines only as told by your health care provider. If told, put ice on the affected area. Put ice in a plastic bag. Place a towel between your skin and the bag. Leave the ice on for 20 minutes, 2-3 times a day. If told, apply heat to the affected area before you exercise or as often as told by your provider. Use the heat source that your provider recommends, such as a moist heat pack or a heating pad. Place a towel between your skin and the heat source. Leave the heat on for 20-30 minutes. If your skin turns bright red, remove the ice or heat right away to prevent skin damage. The risk of damage is higher if you cannot feel pain, heat, or cold. Activity Take breaks from repetitive activity often. Minimize stress on your hands and wrists as much as possible. Do stretches or exercises as told by your provider. Do not do activities that make your pain worse. Wear a hand splint or support as told by your provider. Contact a health care provider if: Your pain does not get better after a few days. Your pain gets worse. Your pain affects your ability to do your daily activities. Your hand becomes warm, red, or swollen. Your hand is numb or tingling. Get help right away if: Your hand is extremely swollen or is an unusual shape. Your hand or fingers turn white or blue. You cannot move your hand, wrist, or fingers. This  information is not intended to replace advice given to you by your health care provider. Make sure you discuss any questions you have with your health care provider. Document Revised: 06/09/2022 Document Reviewed: 06/09/2022 Elsevier Patient Education  2024 [employer] Trigger Finger  Trigger finger, also called stenosing tenosynovitis, is a condition that causes a finger or thumb to get stuck in a bent position. Each finger has tough, cord-like tissue (tendon) that connects muscle to bone, and each tendon passes through a tunnel of tissue (tendon sheath). The tendon sheaths are held close to the bone by a pulley. There is a pulley called the A1 pulley that is involved in the triggering of a finger or thumb. To move your finger, your tendon needs to glide freely through the sheath. Trigger finger happens when the tendon or the sheath thickens, making it difficult to bend or straighten your finger as the thickened tendon gets stuck in the A1 pulley. Trigger finger can affect any of the fingers or the thumbs. Mild cases may clear up with rest and medicine. Severe cases require more treatment. What are the causes? Trigger finger or thumb is caused by a thickened finger tendon or tendon sheath. The cause of this thickening is not known. What increases the risk? The following factors may make you more likely to develop this condition: Doing the same movements many times (repetitive activity) that require a strong grip. Having certain health conditions. These include rheumatoid arthritis, gout, carpal tunnel syndrome, or diabetes. Being 66-58 years old. Being female. What are  the signs or symptoms? Symptoms of this condition include: Pain when bending or straightening your finger. Tenderness, swelling, or a lump in the palm of your hand just below the finger joint. Hearing a noise like a pop or a snap when you try to straighten your finger. Feeling a catch or locked feeling when you try to  straighten your finger. Being unable to straighten your finger without help from your other hand. How is this diagnosed? This condition is diagnosed based on your symptoms and a physical exam. How is this treated? This condition may be treated by: Resting your finger and avoiding activities that make symptoms worse. Wearing a finger splint to keep your finger extended. Taking NSAIDs, such as ibuprofen, to relieve pain and swelling around the tendon. Doing gentle exercises to stretch the finger as told by your health care provider. Having medicine that reduces swelling and inflammation (steroids) injected into the tendon sheath. Injections may need to be repeated. Trigger finger release. This surgery is done to open the pulley. This may be done if other treatments do not work and you cannot straighten or bend your finger. You may need hand therapy after surgery. Follow these instructions at home: If you have a removable splint: Wear the splint as told by your provider. Remove it only as told by your provider. Check the skin around the splint every day. Tell your provider about any concerns. Loosen the splint if your fingers tingle, become numb, or turn cold and blue. Keep the splint clean and dry. If the splint is not waterproof: Do not let it get wet. Cover it with a watertight covering when you take a bath or shower. Managing pain, stiffness, and swelling     If told, put ice on the painful area. If you have a removable splint, remove it as told by your health care provider. Put ice in a plastic bag. Place a towel between your skin and the bag or between your splint and the bag. Leave the ice on for 20 minutes, 2-3 times a day. If told, apply heat to the affected area as often as told by your provider. Use the heat source that your provider recommends, such as a moist heat pack or a heating pad. Place a towel between your skin and the heat source. Leave the heat on for 20-30  minutes. If your skin turns bright red, remove the ice or heat right away to prevent skin damage. The risk of damage is higher if you cannot feel pain, heat, or cold. Activity Rest your finger as told by your provider. Avoid activities that make the pain worse. Return to your normal activities as told by your provider. Ask your provider what activities are safe for you. Do exercises as told by your provider. Ask your provider when it is safe to drive if you have a splint on your hand. General instructions Take over-the-counter and prescription medicines only as told by your provider. Keep all follow-up visits. These are needed to see how you are progressing. Contact a health care provider if: Your symptoms are not improving with home care. This information is not intended to replace advice given to you by your health care provider. Make sure you discuss any questions you have with your health care provider. Document Revised: 06/18/2022 Document Reviewed: 06/18/2022 Elsevier Patient Education  2024 [employer].

## 2023-05-31 NOTE — Progress Notes (Signed)
Subjective:    Patient ID: Sara Cook, female    DOB: June 08, 1966, 57 y.o.   MRN: [id number]  57y/o established caucasian female here for evaluation bilateral hand pain swelling and intermittent pain 6/10 today intermittent occurs at home and work.  History of trigger finger and reported different fingers having same symptoms that needed steroid injection from orthopedics last time.  Treated with splinting and NSAIDS previously.  Hasn't worn splint this time.  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  Denied known injury/trauma/bruising/lacerations/abrasions/rashes.  Saw endocrinology for hyperparathyroidism recently.  Denied n/v/d/fever/chills/tingling/numbness/weakness in hands/fingers.  Patient has scheduled appt with orthopedics this week for evaluation/treatment.      Review of Systems  Constitutional:  Negative for chills, diaphoresis and fever.  HENT:  Negative for trouble swallowing and voice change.   Eyes:  Negative for photophobia and visual disturbance.  Respiratory:  Negative for cough, shortness of breath, wheezing and stridor.   Gastrointestinal:  Negative for diarrhea, nausea and vomiting.  Genitourinary:  Negative for difficulty urinating.  Musculoskeletal:  Positive for arthralgias and joint swelling. Negative for gait problem, neck pain and neck stiffness.  Skin:  Negative for color change, rash and wound.  Neurological:  Negative for dizziness, tremors, seizures, syncope, facial asymmetry, speech difficulty, weakness, light-headedness, numbness and headaches.  Hematological:  Does not bruise/bleed easily.  Psychiatric/Behavioral:  Negative for agitation, confusion and sleep disturbance.        Objective:   Physical Exam Vitals and nursing note reviewed.  Constitutional:      General: She is awake. She is not in acute distress.    Appearance: Normal appearance. She is well-developed and well-groomed. She is obese. She is not ill-appearing,  toxic-appearing or diaphoretic.  HENT:     Head: Normocephalic and atraumatic.     Jaw: There is normal jaw occlusion.     Salivary Glands: Right salivary gland is not diffusely enlarged. Left salivary gland is not diffusely enlarged.     Right Ear: Hearing and external ear normal.     Left Ear: Hearing and external ear normal.     Nose: Nose normal. No congestion or rhinorrhea.     Mouth/Throat:     Lips: Pink. No lesions.     Mouth: Mucous membranes are moist.     Pharynx: Oropharynx is clear.  Eyes:     General: Lids are normal. Vision grossly intact. Gaze aligned appropriately. Allergic shiner present. No scleral icterus.       Right eye: No discharge.        Left eye: No discharge.     Extraocular Movements: Extraocular movements intact.     Conjunctiva/sclera: Conjunctivae normal.     Pupils: Pupils are equal, round, and reactive to light.  Neck:     Trachea: Trachea and phonation normal.  Cardiovascular:     Rate and Rhythm: Normal rate and regular rhythm.     Pulses: Normal pulses.          Radial pulses are 2+ on the right side and 2+ on the left side.  Pulmonary:     Effort: Pulmonary effort is normal.     Breath sounds: Normal breath sounds and air entry. No stridor or transmitted upper airway sounds. No wheezing.     Comments: Spoke full sentences without difficulty; no cough observed in exam room Abdominal:     Palpations: Abdomen is soft.  Musculoskeletal:        General: Swelling present. No deformity or signs  of injury. Normal range of motion.     Right elbow: No swelling, deformity, effusion or lacerations. Normal range of motion.     Left elbow: No swelling, deformity, effusion or lacerations. Normal range of motion.     Right forearm: No swelling, edema, deformity, lacerations, tenderness or bony tenderness.     Left forearm: No swelling, edema, deformity, lacerations, tenderness or bony tenderness.     Right wrist: Swelling present. No deformity, effusion,  lacerations, tenderness, bony tenderness, snuff box tenderness or crepitus. Normal range of motion.     Left wrist: Swelling present. No deformity, effusion, lacerations, tenderness, bony tenderness, snuff box tenderness or crepitus. Normal range of motion.     Right hand: Swelling and tenderness present. No lacerations or bony tenderness. Normal strength. Normal sensation. Normal capillary refill.     Left hand: Swelling and tenderness present. No lacerations or bony tenderness. Normal strength. Normal sensation. Normal capillary refill.     Cervical back: Normal range of motion and neck supple. No swelling, edema, deformity, erythema, signs of trauma, lacerations, rigidity, spasms, torticollis, tenderness or crepitus. No pain with movement. Normal range of motion.     Thoracic back: No swelling, edema, deformity, signs of trauma, lacerations or spasms. Normal range of motion.     Right lower leg: No edema.     Left lower leg: No edema.     Comments: Bilateral middle MCP joint locking extension and not smooth motion popping slightly TTP full strength 5/5 equal grasp bilaterally; full ab/adduction/flexion/extension equal bilaterally hands 1-2+/4 nonpitting edema; negative tinnels/phalen's/finkelsteins bilaterally  Lymphadenopathy:     Head:     Right side of head: No submandibular or preauricular adenopathy.     Left side of head: No submandibular or preauricular adenopathy.     Cervical: No cervical adenopathy.     Right cervical: No superficial cervical adenopathy.    Left cervical: No superficial cervical adenopathy.  Skin:    General: Skin is warm and dry.     Capillary Refill: Capillary refill takes less than 2 seconds.     Coloration: Skin is not ashen, cyanotic, jaundiced, mottled, pale or sallow.     Findings: No abrasion, abscess, acne, bruising, burn, ecchymosis, erythema, signs of injury, laceration, lesion, petechiae, rash or wound.     Nails: There is no clubbing.  Neurological:      General: No focal deficit present.     Mental Status: She is alert and oriented to person, place, and time. Mental status is at baseline.     GCS: GCS eye subscore is 4. GCS verbal subscore is 5. GCS motor subscore is 6.     Cranial Nerves: No cranial nerve deficit, dysarthria or facial asymmetry.     Sensory: Sensation is intact.     Motor: Motor function is intact. No weakness, tremor, atrophy, abnormal muscle tone or seizure activity.     Coordination: Coordination is intact. Coordination normal.     Gait: Gait is intact. Gait normal.     Comments: In/out of chair without difficulty; gait sure and steady in clinic; bilateral hand grasp equal 5/5  Psychiatric:        Attention and Perception: Attention and perception normal.        Mood and Affect: Mood and affect normal.        Speech: Speech normal.        Behavior: Behavior normal. Behavior is cooperative.        Thought Content: Thought content normal.  Cognition and Memory: Cognition and memory normal.        Judgment: Judgment normal.    ORTHO XR HAND 3 OR MORE VW UNILATERAL, 03/24/18  Order Questions Answers Reason for exam: pain Laterality: Left View(s): AP,Oblique,Lateral Is the patient pregnant? No      FINDINGS: Multiple x-rays left hand and wrist show no bony injury fracture dislocation arthritis or malalignment Procedure Note  Coralee Pesa, MD - 12/13/2022 Formatting of this note might be different from the original. ORTHO XR HAND 3 OR MORE VW UNILATERAL, 03/24/18  Order Questions Answers Reason for exam: pain Laterality: Left View(s): AP,Oblique,Lateral Is the patient pregnant? No      FINDINGS: Multiple x-rays left hand and wrist show no bony injury fracture dislocation arthritis or malalignment    IMPRESSION:  Normal bony anatomy left hand and wrist for age Specimen Collected: --   Performed by: Lerry Liner FOREST CONVERSION Last Resulted: 03/24/18 15:50  Received From: [facility]  Result Received: 01/11/23 13:58   Impression   Normal bony anatomy left hand and wrist for age Narrative  This result has an attachment that is not available. ORTHO XR HAND 3 OR MORE VW UNILATERAL, 03/24/18  Order Questions Answers Reason for exam: pain Laterality: Left View(s): AP,Oblique,Lateral Is the patient pregnant? No  Epic care everywhere reviewed patient saw orthopedics 2019 ring trigger finger lidocaine and dexamathasone injection [facility]; historical xrays hands normal 2019 and 2016    FINDINGS: Multiple x-rays left hand and wrist show no bony injury fracture dislocation arthritis or malalignment Exam End: 03/24/18 15:42 Last Resulted: 03/24/18 15:53  Received From: [facility] visits prior to 01/15/2023.  Result Received: 11/29/22 08:30   Anatomical Region Laterality Modality  Hand -- Computed Radiography  Wrist -- --   Narrative  INDICATION: rt hand pain and swelling 4th metacarpal  TECHNIQUE: XR Right Hand.  Comparison none  FINDINGS: No fracture, dislocation, or radiopaque foreign body.  IMPRESSION: No acute bony abnormality. Procedure Note  Argie Ramming, MD - 01/27/2015 Formatting of this note might be different from the original. INDICATION: rt hand pain and swelling 4th metacarpal  TECHNIQUE: XR Right Hand.  Comparison none  FINDINGS: No fracture, dislocation, or radiopaque foreign body.  IMPRESSION: No acute bony abnormality. Exam End: --   Specimen Collected: 01/27/15 14:30 Last Resulted: 01/27/15 15:36  Received From: [facility]  Result Received: 12/03/22 13:06     Negative tinnels and phalens; bilateral 3rd MCP locking and popping applied aluminum splint right from clinic stock secured with cobain and patient given cobain to use prn soiling/replacement.  Discussed with patient cobain can be adjusted if loosens or too tight unwrap and rewrap over splint do not want splint sliding/moving.  May remove splint to  shower/bath or cover with plastic bag.  Patient verbalized understanding information/instructions, agreed with plan of care and had no further questions at this time.      Assessment & Plan:  A-bilateral hand pain acute, hand swelling, trigger finger unspecified bilaterally  P-Keep scheduled orthopedics appt Exitcare handout hand pain and trigger finger to my chart per patient preference discussed steroid injection, nsaid, ice, splint typical treatments for trigger finger Negative exam for carpal tunnel today I cannot write work restrictions in this clinic if needed will need to ask orthopedics/PCM/see worker's comp clinic provider see RN Kimrey to schedule Referred to clinic by supervisor today incident report reviewed no known injury at work but swelling occurring during work hours Cryotherapy 15 minutes QID  prn; elevate hands on breaks/at home when swollen Patient did not want any prescription medication today has used NSAIDS in the past. Discussed wear splint while awake may remove to sleep but best if kept in splint 4-6 weeks per up to date. Follow up prn new or worsening symptoms.  Keep follow up as indicated with orthopedics. Patient agreed with plan of care and had no further questions at this time.

## 2023-06-01 ENCOUNTER — Ambulatory Visit: Payer: No Typology Code available for payment source | Admitting: Occupational Medicine

## 2023-06-10 IMAGING — CT CT ABD-PELV W/ CM
2 of 5 series · 15 of 46 positions shown, 17 images · IV contrast (agent unspecified)
Comparison: None.

CLINICAL DATA: Abdominal pain.

EXAM:
CT ABDOMEN AND PELVIS WITH CONTRAST
TECHNIQUE: Multidetector CT imaging of the abdomen and pelvis was performed
using the standard protocol following bolus administration of
intravenous contrast.

[Series 2: axial st · axial · 0.82mm/px · z∈[-518,-88]mm · 12 of 102 slices shown, 14 images]
[im 8/102  soft-tissue]
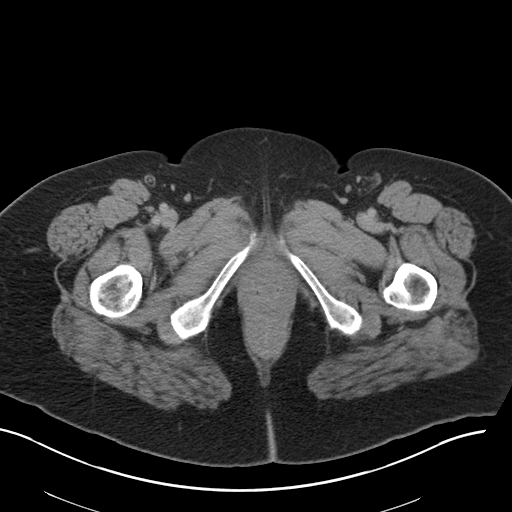
[im 8/102  bone]
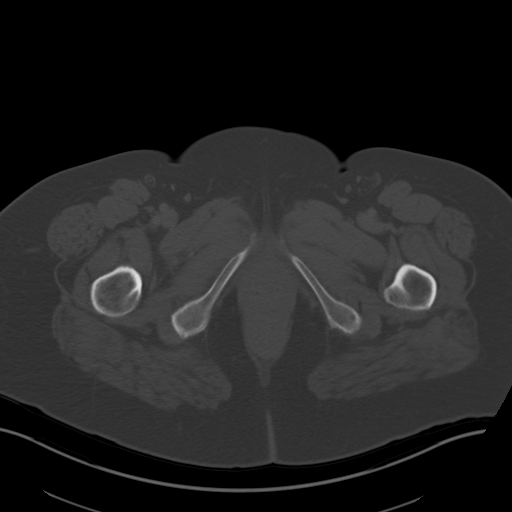
[im 16/102  soft-tissue]
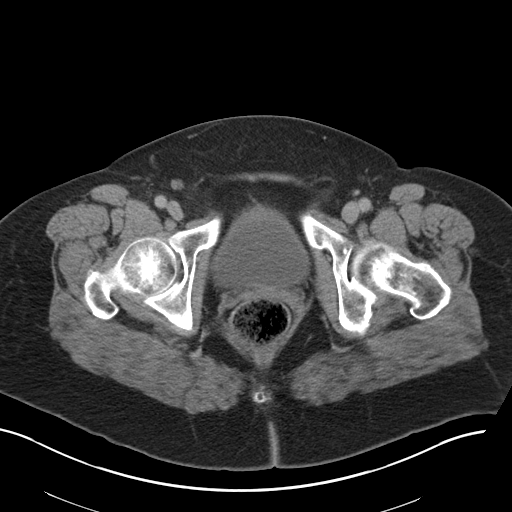
[im 24/102  soft-tissue]
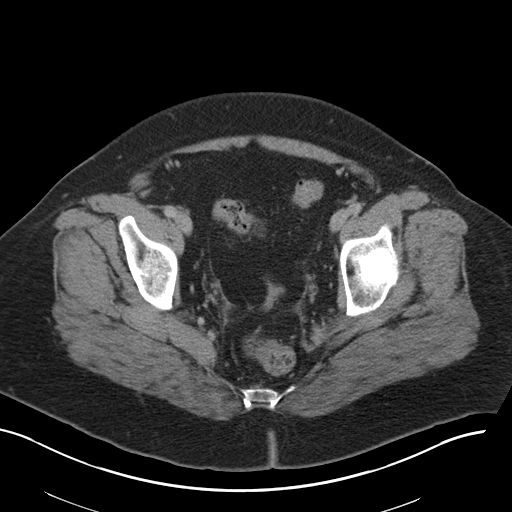
[im 32/102  soft-tissue]
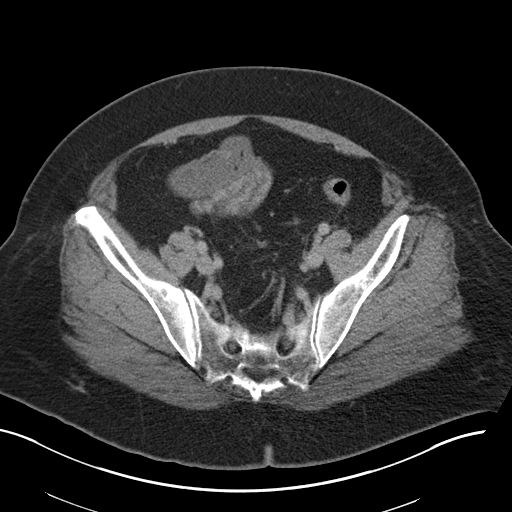
[im 39/102  soft-tissue]
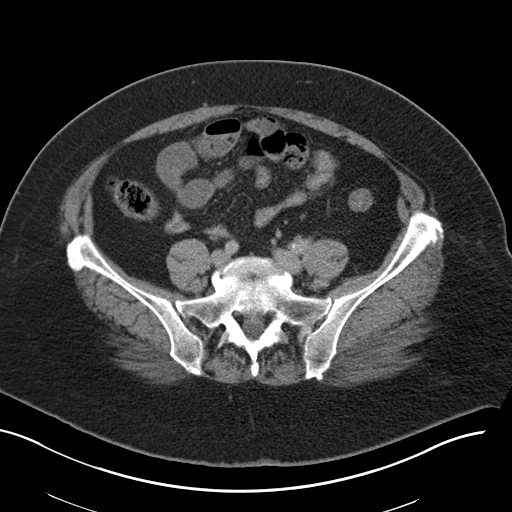
[im 47/102  soft-tissue]
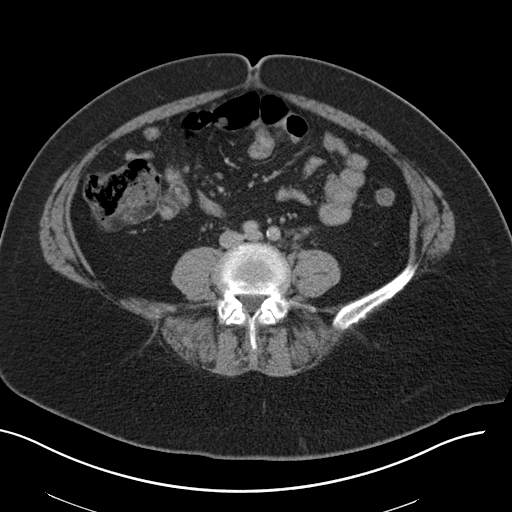
[im 55/102  soft-tissue]
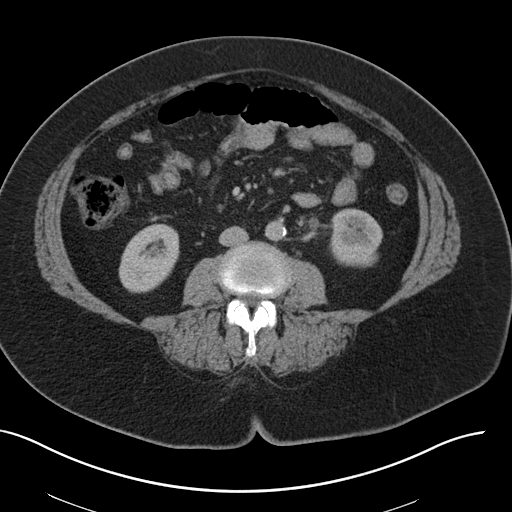
[im 63/102  soft-tissue]
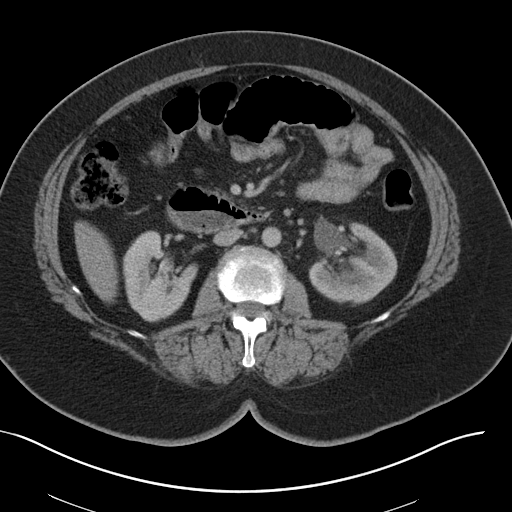
[im 70/102  soft-tissue]
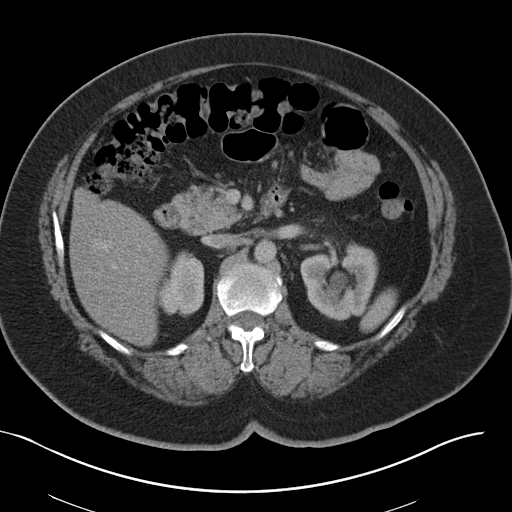
[im 70/102  bone]
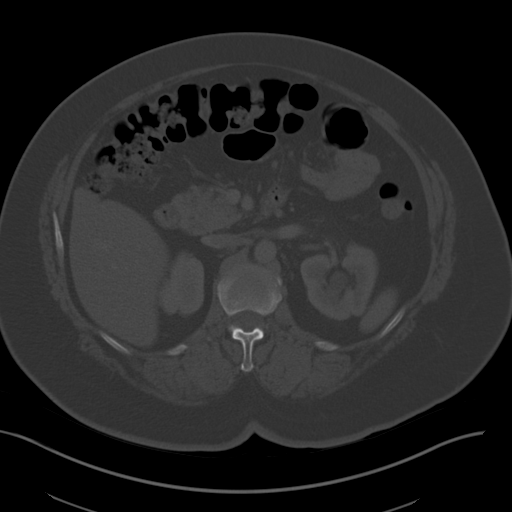
[im 78/102  soft-tissue]
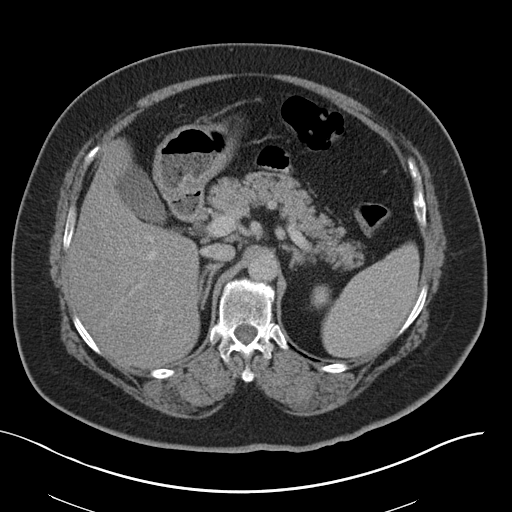
[im 86/102  soft-tissue]
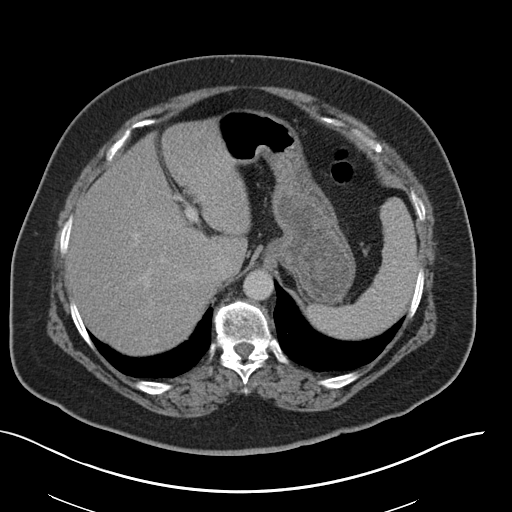
[im 94/102  soft-tissue]
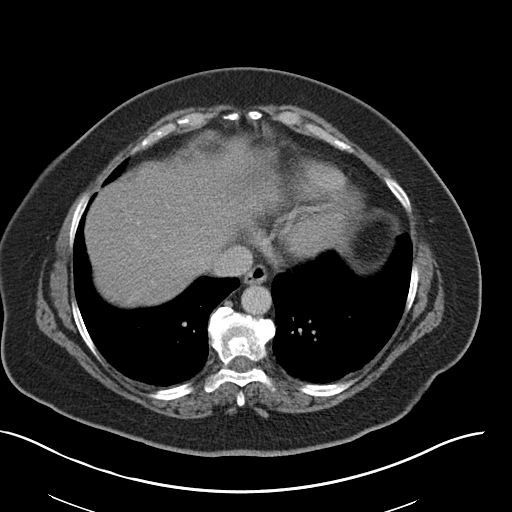

[Series 4: coronal st · coronal · 0.97mm/px · 3 of 161 slices shown]
[im 54/161  soft-tissue]
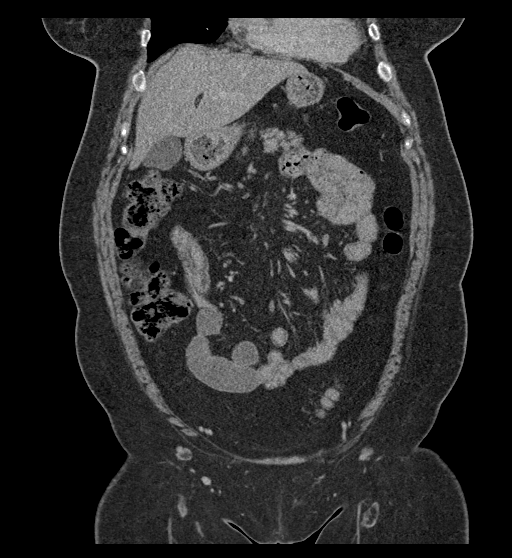
[im 72/161  soft-tissue]
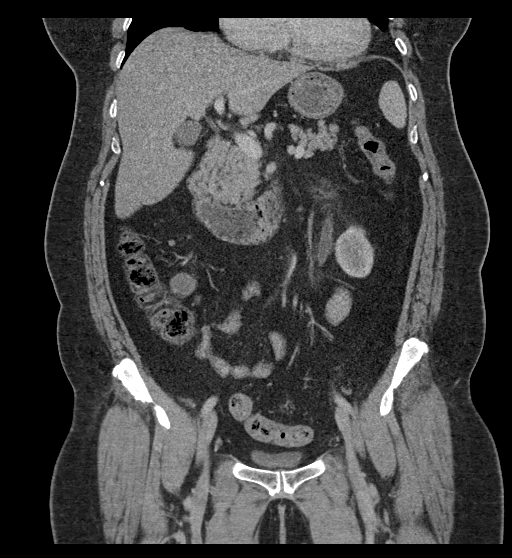
[im 89/161  soft-tissue]
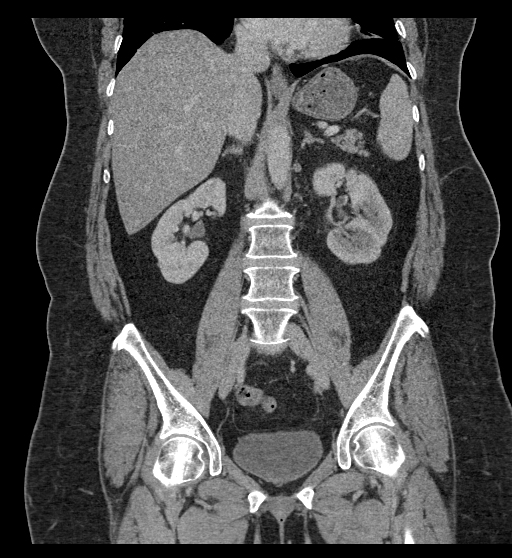

[15 of 46 positions shown; findings below may reference images not displayed]

RADIATION DOSE REDUCTION: This exam was performed according to the
departmental dose-optimization program which includes automated
exposure control, adjustment of the mA and/or kV according to
patient size and/or use of iterative reconstruction technique.

CONTRAST:  100mL OMNIPAQUE IOHEXOL 300 MG/ML  SOLN
FINDINGS: Lower chest: Mild cardiomegaly. There is mild posterior atelectasis
without infiltrates in the lung bases.

Hepatobiliary: 21 cm in length mildly steatotic liver. There is a 2
cm heterogeneously peripherally enhancing lesion in segment 5 which
is probably a hemangioma but this needs to be confirmed with hepatic
dedicated imaging.

Rest of the liver is unremarkable. The gallbladder and bile ducts
are unremarkable.

Pancreas: No focal abnormality.

Spleen: No focal abnormality. Slightly prominent measuring 13.6 cm
AP.

Adrenals/Urinary Tract: There is no adrenal or renal cortical mass
enhancement. There is a small hypodensity in the posterior right
kidney which is too small to characterize. There is no renal
cortical abnormality elsewhere.

On the left, there is delayed cortical contrast enhancement and mild
hydroureteronephrosis with 3 x 2 x 4 mm proximal left ureteral stone
superimposing about the tip of the left L4 transverse process.

There is a single punctate nonobstructing caliceal stone in the
inferior pole right kidney but no intrarenal stones or further right
ureteral stones. There are no other ureteral stones. There is no
bladder thickening.

Stomach/Bowel: The stomach is contracted. The duodenum is
unremarkable. There are dilated upper to mid abdominal small bowel
segments up to 4 cm caliber which are believed to be jejunal
segments.

There is a transitional segment which has an apparent kink in the
small bowel in the right mid abdomen on series 2 axial image 41 and
a short distance from this a separate focal transition on [DATE],
suggesting adhesive disease as etiology for small bowel obstruction.

The rest of the small bowel is collapsed. The appendix is normal
caliber. There are colonic diverticula without evidence of
diverticulitis.

Vascular/Lymphatic: Mild aortoiliac atherosclerosis. No adenopathy
is seen.

Reproductive: Status post hysterectomy. No adnexal masses.

Other: No abdominal wall incarcerated hernia. There is trace fluid
anterior to the left renal vein just above the level of the left
renal pelvis with peripelvic and periureteral stranding changes.
There is no free air, hemorrhage or abscess.

Musculoskeletal: Mild osteopenia and degenerative change lumbar
spine. No worrisome regional bone lesion.
IMPRESSION: 1. 3 x 2 x 4 mm proximal left ureteral stone with mild obstructive
uropathy. Correlate clinically for infectious complication.
2. Trace fluid seen in the retroperitoneum just above the dilated
left renal pelvis. This could be reactive fluid related to
obstructive uropathy or could be due to a ureteral leak. This is the
only free fluid in the abdomen and pelvis.
3. Nonobstructive micronephrolithiasis on the right.
4. Small right renal hypodensity which is too small to characterize.
5. Intermediate grade small bowel obstruction with 4 cm dilated
proximal small bowel loops and a short-segment with tandem
transitions in the right mid abdomen suggesting adhesive disease as
etiology, with collapse of the downstream small bowel.
6. Diverticulosis without evidence of diverticulitis.
7. Mildly steatotic liver, mildly prominent liver and spleen, and a
2 cm lesion in segment 5 which is probably a hemangioma but should
be further evaluated with liver dedicated MRI to confirm a benign
process.
8. Osteopenia and degenerative change.
9. Mild cardiomegaly.
10. Aortic atherosclerosis.

## 2023-06-11 IMAGING — DX DG ABD PORTABLE 1V
1 series · 1 of 1 positions shown · non-contrast
Comparison: 03/04/2022 at [DATE] a.m.

CLINICAL DATA: 8 hour delay for small-bowel obstruction

EXAM:
PORTABLE ABDOMEN - 1 VIEW

[abdomen kub]
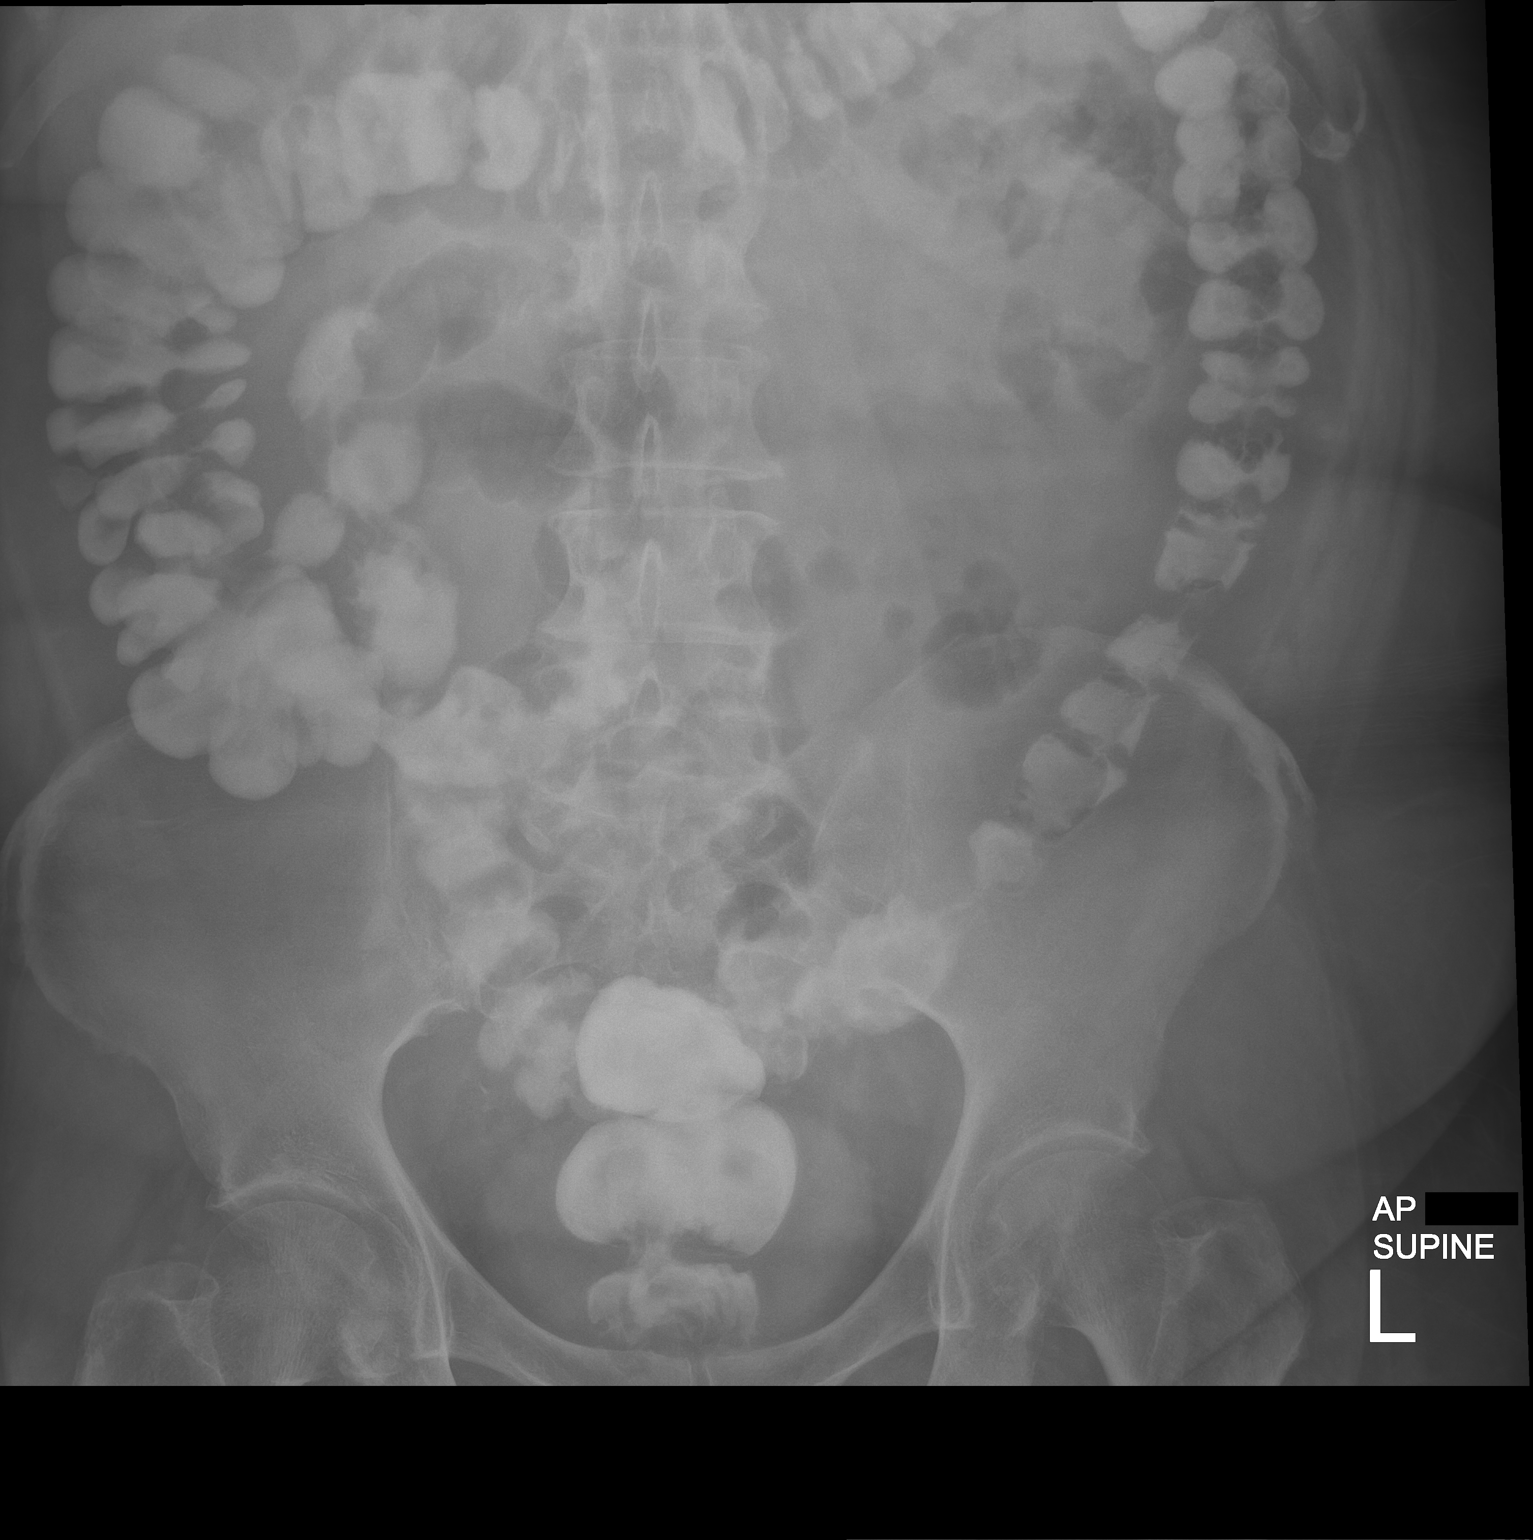

[1 of 1 positions shown; findings below may reference images not displayed]

FINDINGS: Supine frontal view of the abdomen and pelvis demonstrates
progression of oral contrast throughout the colon to the level of
the rectum. No evidence of bowel obstruction or ileus. No masses or
abnormal calcifications.
IMPRESSION: 1. Progression of oral contrast into the colon, with no evidence of
small-bowel obstruction on this exam.

## 2023-06-11 IMAGING — DX DG ABDOMEN 1V
1 series · 1 of 1 positions shown · non-contrast
Comparison: CT abdomen and pelvis 03/03/2022.

CLINICAL DATA: NG tube placement.

EXAM:
ABDOMEN - 1 VIEW

[abdomen kub]
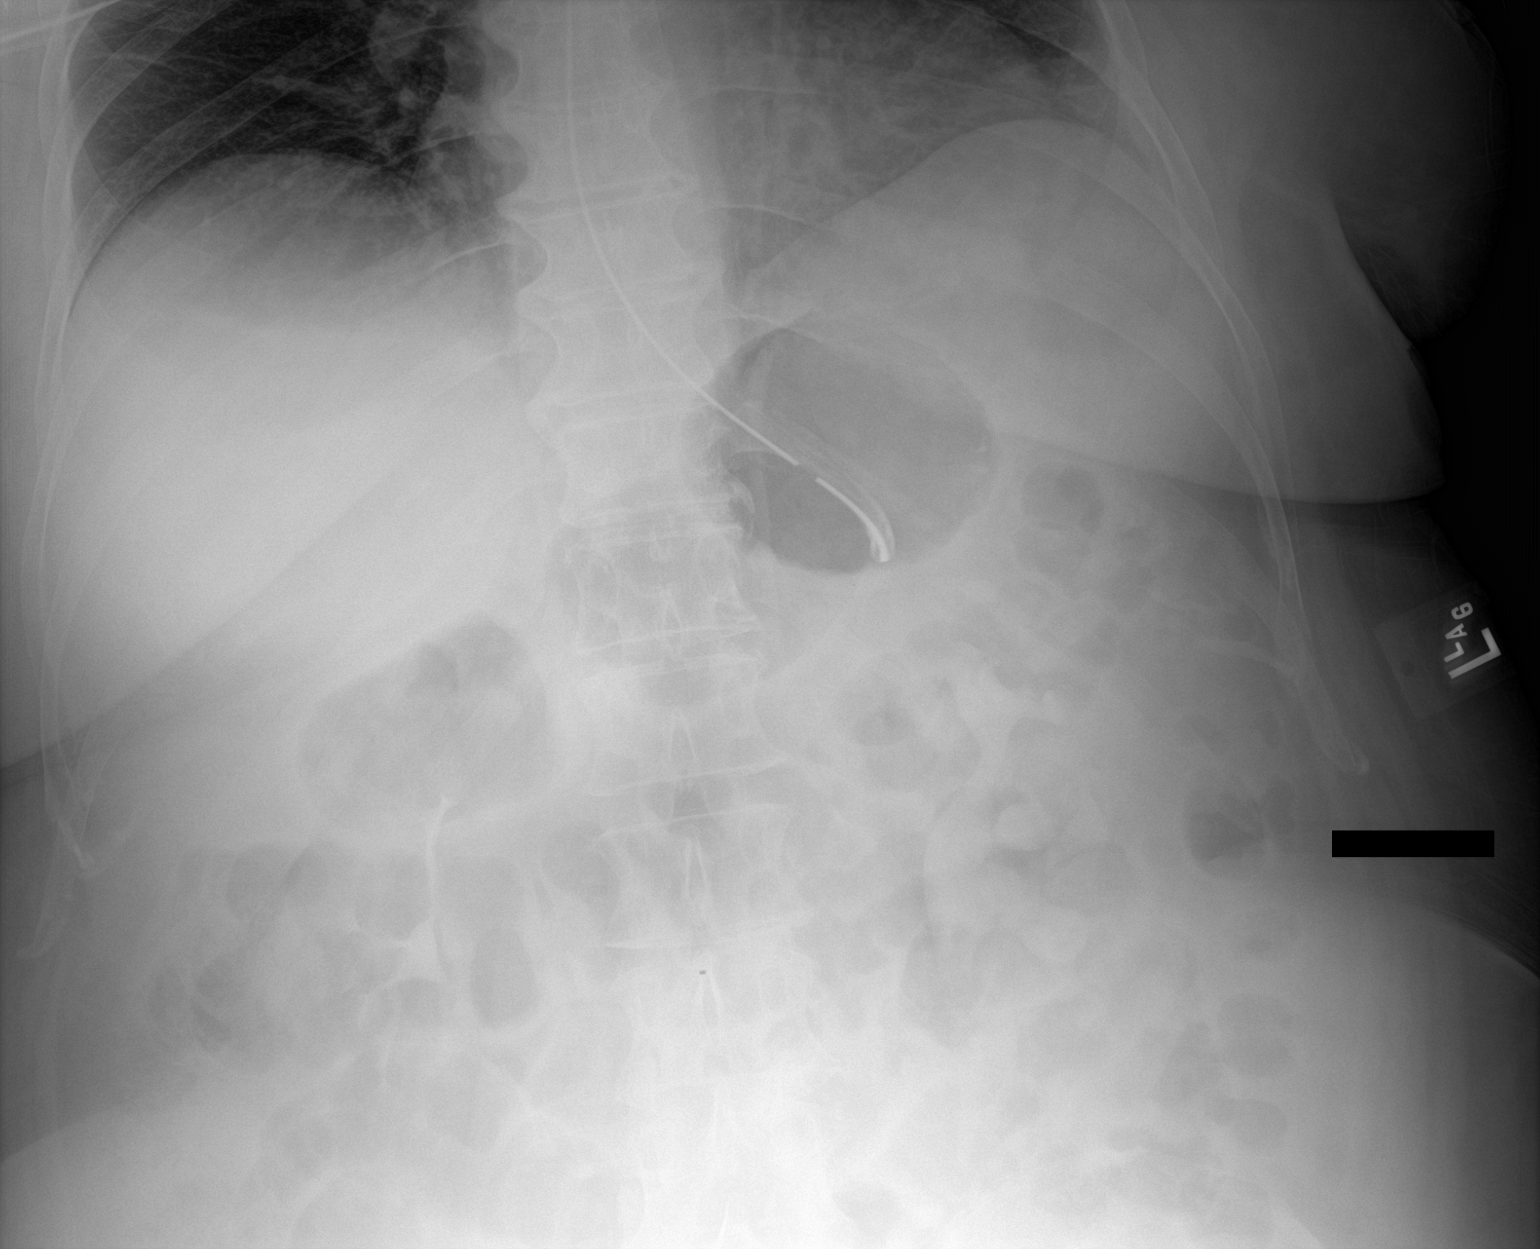

[1 of 1 positions shown; findings below may reference images not displayed]

FINDINGS: Nasogastric tube tip is at the level of the proximal residual
contrast is seen in the renal collecting systems. There is mild left
hydronephrosis. There is gaseous distention of bowel in the upper
abdomen as seen on the prior CT. Body of the stomach.
IMPRESSION: 1. Nasogastric tube tip is in the proximal body of the stomach.

## 2023-06-11 NOTE — Telephone Encounter (Signed)
Per epic patient read results note 03/12/23; saw RN Kimrey 03/29/23 BP 132/84 weight 259lbs stable Hgba1c 5.4 and LDL 143 met requirements for Be Well 2025 insurance discount  NP signed provider paperwork 03/29/23 RN Kimrey notified HR patient met requirements; patient had appt with [facility] 05/18/23 next appt scheduled 09/15/23; [facility] 04/22/23 and scheduled next appt 10/25/23  Calcium and vitamin D normal on recheck 10.2  & 55.6 D 04/22/23

## 2023-06-11 NOTE — Progress Notes (Signed)
Per epic patient read results note 03/12/23; saw RN Kimrey 03/29/23 BP 132/84 weight 259lbs stable Hgba1c 5.4 and LDL 143 met requirements for Be Well 2025 insurance discount  NP signed provider paperwork 03/29/23 RN Kimrey notified HR patient met requirements; patient had appt with [facility] 05/18/23 next appt scheduled 09/15/23; [facility] 04/22/23 and scheduled next appt 10/25/23  Calcium and vitamin D normal on recheck 10.2  & 55.6 D 04/22/23

## 2023-07-08 ENCOUNTER — Encounter: Payer: Self-pay | Admitting: Obstetrics

## 2023-08-29 ENCOUNTER — Telehealth: Payer: Self-pay | Admitting: *Deleted

## 2023-08-29 NOTE — Telephone Encounter (Signed)
Spoke with patient's daughter Lativia Velie who called the office to schedule a follow up appt. For her mother Sara Cook with Dr. Tamela Oddi. Pt was seen January 2024, and was due for a follow up in July. Pt was given an appt. For November 13 th. At 3:15 pm. Daughter agreed to date and time.

## 2023-09-15 DIAGNOSIS — E559 Vitamin D deficiency, unspecified: Secondary | ICD-10-CM | POA: Insufficient documentation

## 2023-09-16 DIAGNOSIS — R7301 Impaired fasting glucose: Secondary | ICD-10-CM | POA: Insufficient documentation

## 2023-09-28 ENCOUNTER — Inpatient Hospital Stay
Payer: No Typology Code available for payment source | Attending: Obstetrics & Gynecology | Admitting: Obstetrics & Gynecology

## 2023-09-28 ENCOUNTER — Encounter: Payer: Self-pay | Admitting: Obstetrics & Gynecology

## 2023-09-28 VITALS — BP 152/88 | HR 83 | Temp 98.9°F | Resp 20 | Wt 263.6 lb

## 2023-09-28 DIAGNOSIS — Z8542 Personal history of malignant neoplasm of other parts of uterus: Secondary | ICD-10-CM

## 2023-09-28 DIAGNOSIS — C541 Malignant neoplasm of endometrium: Secondary | ICD-10-CM

## 2023-09-28 NOTE — Patient Instructions (Signed)
Return in 1 year ?

## 2023-09-28 NOTE — Assessment & Plan Note (Signed)
[name]  is a 57 y.o.  year old P2 with stage IB grade 1 endometrial cancer, MMR normal/MSI stable. S/p staging on 03/17/21. Negative symptom review, normal exam.  No evidence of recurrence    >continue follow-up every 6 months for 5 years in accordance with NCCN guideline

## 2023-09-28 NOTE — Progress Notes (Signed)
Follow Up Note: Gyn-Onc  Sara Cook 57 y.o. female  CC: F/U visit   HPI: The oncology history was reviewed.  Interval History:  She denies any vaginal bleeding, abdominal/pelvic pain, cough, lethargy or increasing abdominal girth.   However, they express significant anxiety related to their medical visits, which they attribute to their cancer history. This anxiety is severe enough to cause noticeable elevations in their blood pressure during appointments. Despite understanding their low-risk status and successful treatment, the patient continues to experience this stress. She reports receiving care from a gynecologist, alternating visits between the two providers.  Review of Systems  Review of Systems  Constitutional:  Negative for malaise/fatigue and weight loss.  Respiratory:  Negative for shortness of breath and wheezing.   Cardiovascular:  Negative for chest pain and leg swelling.  Gastrointestinal:  Negative for abdominal pain, blood in stool, constipation, nausea and vomiting.  Genitourinary:  Negative for dysuria, frequency, hematuria and urgency.  Musculoskeletal:  Negative for joint pain and myalgias.  Neurological:  Negative for weakness.  Psychiatric/Behavioral:  Negative for depression. The patient does not have insomnia.    Current medications, allergy, social history, past surgical history, past medical history, family history were all reviewed.   Vitals:  BP (!) 152/88 Comment: manual recheck, MD notified, pt to monitor, f/u with PCP  Pulse 83   Temp 98.9 F (37.2 C) (Oral)   Resp 20   Wt 263 lb 9.6 oz (119.6 kg)   SpO2 99%   BMI 40.08 kg/m    Physical Exam:  Physical Exam Exam conducted with a chaperone present.  Constitutional:      General: She is not in acute distress. Cardiovascular:     Rate and Rhythm: Normal rate and regular rhythm.  Pulmonary:     Effort: Pulmonary effort is normal.     Breath sounds: Normal breath sounds. No wheezing or  rhonchi.  Abdominal:     Palpations: Abdomen is soft.     Tenderness: There is no abdominal tenderness. There is no right CVA tenderness or left CVA tenderness.     Hernia: No hernia is present.  Genitourinary:    General: Normal vulva; left labia minora--yellowish white pedunculated nodule--< 3mm.     Urethra: No urethral lesion.     Vagina: No lesions. No vaginal bleeding. Scarring at apex Musculoskeletal:     Cervical back: Neck supple.     Right lower leg: No edema.     Left lower leg: No edema.  Lymphadenopathy:     Upper Body:     Right upper body: No supraclavicular adenopathy.     Left upper body: No supraclavicular adenopathy.     Lower Body: No right inguinal adenopathy. No left inguinal adenopathy.  Skin:    Findings: No rash.  Neurological:     Mental Status: She is oriented to person, place, and time.   Assessment/Plan:  Endometrial cancer [facility]) Sara Cook  is a 57 y.o.  year old P2 with stage IB grade 1 endometrial cancer, MMR normal/MSI stable. S/p staging on 03/17/21. Negative symptom review, normal exam.  No evidence of recurrence    >continue follow-up every 6 months for 5 years in accordance with NCCN guideline     I personally spent 25 minutes face-to-face and non-face-to-face in the care of this patient, which includes all pre, intra, and post visit time on the date of service.    Antionette Char, MD

## 2023-11-03 LAB — MOLECULAR PATHOLOGY

## 2023-11-29 ENCOUNTER — Other Ambulatory Visit: Payer: Self-pay | Admitting: Internal Medicine

## 2023-11-29 DIAGNOSIS — Z1231 Encounter for screening mammogram for malignant neoplasm of breast: Secondary | ICD-10-CM

## 2023-12-13 ENCOUNTER — Ambulatory Visit
Admission: RE | Admit: 2023-12-13 | Discharge: 2023-12-13 | Disposition: A | Discharge status: Home / Self Care | Payer: No Typology Code available for payment source | Source: Ambulatory Visit

## 2023-12-13 DIAGNOSIS — Z1231 Encounter for screening mammogram for malignant neoplasm of breast: Secondary | ICD-10-CM

## 2023-12-16 ENCOUNTER — Other Ambulatory Visit: Payer: Self-pay | Admitting: Internal Medicine

## 2023-12-16 DIAGNOSIS — R928 Other abnormal and inconclusive findings on diagnostic imaging of breast: Secondary | ICD-10-CM

## 2023-12-22 ENCOUNTER — Telehealth: Payer: Self-pay | Admitting: Registered Nurse

## 2023-12-22 ENCOUNTER — Encounter: Payer: Self-pay | Admitting: Registered Nurse

## 2023-12-22 ENCOUNTER — Ambulatory Visit: Payer: No Typology Code available for payment source | Admitting: Registered Nurse

## 2023-12-22 DIAGNOSIS — J069 Acute upper respiratory infection, unspecified: Secondary | ICD-10-CM

## 2023-12-22 NOTE — Telephone Encounter (Signed)
 Patient missed appt today.  Contacted patient via telephone stated stayed home today due to cough/congestion/headache/sinus pressure. Taking sudafed, robitussin, tessalon  pearles and OTC cough/cold medicine.  Does not do nose sprays.  Medicine is helping a little still very clear mucous runny nose.  Able to breath through nose now since starting medicine.  Home covid test negative.  Denied fever/chills.  Discussed with patient covid/influenza/metapneumovirus/adenovirus/RSV circulating in high levels in community.  Typically lasting symptoms 10-14 days.  Mucous clear to yellow to green then done.  Honey 1 tablespoon po q4h prn cough.  Tessalon  pearles 200mg  po q8h prn cough  Discussed sudafed may cause drowsiness, elevated heart rate, BP, insomnia   Discussed need to dry up post nasal drip and will help with cough/sore throat also. Discussed hydrate with water  to keep urine pale yellow clear and voiding every 2-4 hours while awake.  Avoid driving until she knows how she responds to sudafed medication.  Discussed post nasal drip triggering cough need to get it dried up.   Avoid triggers if possible.  Shower prior to bedtime after work and am prn congestion Call or return to clinic as needed if these symptoms worsen or fail to improve as anticipated e.g. brown opaque mucous/bloody mucous/chunky mucous, wheezing or dyspnea develop.   Exitcare handouts viral URI with cough, and sinus rinse sent to patient my chart.  Patient A&Ox3 spoke full sentences without difficulty throat clearing and nasal congestion audible during 4 minute call.  Supervisor and HR notified excused absence 2/6-12/23/23  Patient may contact NP via my chart or email [email or web address] if worsening symptoms prior to clinic opening Monday 12/26/23 RN Chantel in clinic 8a-5p and I am onsite Tuesday 2/11 9-12p  Patient verbalized understanding of instructions, agreed with plan of care and had no further questions at this time.

## 2023-12-25 MED ORDER — PROMETHAZINE HCL 6.25 MG/5ML PO SOLN: 6.2500 mg | Freq: Four times a day (QID) | ORAL | 0 refills | Status: DC | PRN

## 2023-12-27 ENCOUNTER — Ambulatory Visit: Payer: No Typology Code available for payment source | Admitting: Registered Nurse

## 2023-12-27 ENCOUNTER — Encounter: Payer: Self-pay | Admitting: Registered Nurse

## 2023-12-27 VITALS — HR 82 | Temp 98.2°F | Resp 16

## 2023-12-27 DIAGNOSIS — J069 Acute upper respiratory infection, unspecified: Secondary | ICD-10-CM

## 2023-12-27 DIAGNOSIS — H6993 Unspecified Eustachian tube disorder, bilateral: Secondary | ICD-10-CM

## 2023-12-27 NOTE — Telephone Encounter (Signed)
See office note patient seen in clinic today stated cough much improved today

## 2023-12-27 NOTE — Patient Instructions (Addendum)
Eustachian Tube Dysfunction  Eustachian tube dysfunction refers to a condition in which a blockage develops in the narrow passage that connects the middle ear to the back of the nose (eustachian tube). The eustachian tube regulates air pressure in the middle ear by letting air move between the ear and nose. It also helps to drain fluid from the middle ear space. Eustachian tube dysfunction can affect one or both ears. When the eustachian tube does not function properly, air pressure, fluid, or both can build up in the middle ear. What are the causes? This condition occurs when the eustachian tube becomes blocked or cannot open normally. Common causes of this condition include: Ear infections. Colds and other infections that affect the nose, mouth, and throat (upper respiratory tract). Allergies. Irritation from cigarette smoke. Irritation from stomach acid coming up into the esophagus (gastroesophageal reflux). The esophagus is the part of the body that moves food from the mouth to the stomach. Sudden changes in air pressure, such as from descending in an airplane or scuba diving. Abnormal growths in the nose or throat, such as: Growths that line the nose (nasal polyps). Abnormal growth of cells (tumors). Enlarged tissue at the back of the throat (adenoids). What increases the risk? You are more likely to develop this condition if: You smoke. You are overweight. You are a child who has: Certain birth defects of the mouth, such as cleft palate. Large tonsils or adenoids. What are the signs or symptoms? Common symptoms of this condition include: A feeling of fullness in the ear. Ear pain. Clicking or popping noises in the ear. Ringing in the ear (tinnitus). Hearing loss. Loss of balance. Dizziness. Symptoms may get worse when the air pressure around you changes, such as when you travel to an area of high elevation, fly on an airplane, or go scuba diving. How is this diagnosed? This  condition may be diagnosed based on: Your symptoms. A physical exam of your ears, nose, and throat. Tests, such as those that measure: The movement of your eardrum. Your hearing (audiometry). How is this treated? Treatment depends on the cause and severity of your condition. In mild cases, you may relieve your symptoms by moving air into your ears. This is called "popping the ears." In more severe cases, or if you have symptoms of fluid in your ears, treatment may include: Medicines to relieve congestion (decongestants). Medicines that treat allergies (antihistamines). Nasal sprays or ear drops that contain medicines that reduce swelling (steroids). A procedure to drain the fluid in your eardrum. In this procedure, a small tube may be placed in the eardrum to: Drain the fluid. Restore the air in the middle ear space. A procedure to insert a balloon device through the nose to inflate the opening of the eustachian tube (balloon dilation). Follow these instructions at home: Lifestyle Do not do any of the following until your health care provider approves: Travel to high altitudes. Fly in airplanes. Work in a Estate agent or room. Scuba dive. Do not use any products that contain nicotine or tobacco. These products include cigarettes, chewing tobacco, and vaping devices, such as e-cigarettes. If you need help quitting, ask your health care provider. Keep your ears dry. Wear fitted earplugs during showering and bathing. Dry your ears completely after. General instructions Take over-the-counter and prescription medicines only as told by your health care provider. Use techniques to help pop your ears as recommended by your health care provider. These may include: Chewing gum. Yawning. Frequent, forceful swallowing.  Closing your mouth, holding your nose closed, and gently blowing as if you are trying to blow air out of your nose. Keep all follow-up visits. This is important. Contact a  health care provider if: Your symptoms do not go away after treatment. Your symptoms come back after treatment. You are unable to pop your ears. You have: A fever. Pain in your ear. Pain in your head or neck. Fluid draining from your ear. Your hearing suddenly changes. You become very dizzy. You lose your balance. Get help right away if: You have a sudden, severe increase in any of your symptoms. Summary Eustachian tube dysfunction refers to a condition in which a blockage develops in the eustachian tube. It can be caused by ear infections, allergies, inhaled irritants, or abnormal growths in the nose or throat. Symptoms may include ear pain or fullness, hearing loss, or ringing in the ears. Mild cases are treated with techniques to unblock the ears, such as yawning or chewing gum. More severe cases are treated with medicines or procedures. This information is not intended to replace advice given to you by your health care provider. Make sure you discuss any questions you have with your health care provider. Document Revised: 01/12/2021 Document Reviewed: 01/12/2021 Elsevier Patient Education  2024 [employer]Cough, Adult Coughing is a reflex that clears your throat and airways (respiratory system). It helps heal and protect your lungs. It is normal to cough from time to time. A cough that happens with other symptoms or that lasts a long time may be a sign of a condition that needs treatment. A short-term (acute) cough may only last 2-3 weeks. A long-term (chronic) cough may last 8 or more weeks. Coughing is often caused by: Diseases, such as: An infection of the respiratory system. Asthma or other heart or lung diseases. Gastroesophageal reflux. This is when acid comes back up from the stomach. Breathing in things that irritate your lungs. Allergies. Postnasal drip. This is when mucus runs down the back of your throat. Smoking. Some medicines. Follow these instructions at  home: Medicines Take over-the-counter and prescription medicines only as told by your health care provider. Talk with your provider before you take cough medicine (cough suppressants). Eating and drinking Do not drink alcohol. Avoid caffeine. Drink enough fluid to keep your pee (urine) pale yellow. Lifestyle Avoid cigarette smoke. Do not use any products that contain nicotine or tobacco. These products include cigarettes, chewing tobacco, and vaping devices, such as e-cigarettes. If you need help quitting, ask your provider. Avoid things that make you cough. These may include perfumes, candles, cleaning products, or campfire smoke. General instructions  Watch for any changes to your cough. Tell your provider about them. Always cover your mouth when you cough. If the air is dry in your bedroom or home, use a cool mist vaporizer or humidifier. If your cough is worse at night, try to sleep in a semi-upright position. Rest as needed. Contact a health care provider if: You have new symptoms, or your symptoms get worse. You cough up pus. You have a fever that does not go away or a cough that does not get better after 2-3 weeks. You cannot control your cough with medicine, and you are losing sleep. You have pain that gets worse or is not helped with medicine. You lose weight for no clear reason. You have night sweats. Get help right away if: You cough up blood. You have trouble breathing. Your heart is beating very fast. These symptoms may  be an emergency. Get help right away. Call 911. Do not wait to see if the symptoms will go away. Do not drive yourself to the hospital. This information is not intended to replace advice given to you by your health care provider. Make sure you discuss any questions you have with your health care provider. Document Revised: 07/02/2022 Document Reviewed: 07/02/2022 Elsevier Patient Education  2024 [employer].

## 2023-12-27 NOTE — Progress Notes (Signed)
Subjective:    Patient ID: Sara Cook, female    DOB: 11-25-1965, 58 y.o.   MRN: [id number]  58y/o established caucasian female here for cough follow up.  Stated promethazine liquid helped her to sleep last night and feeling much better today after a good nights rests  Denied fever/chills/pain/wheezing today.  Her ears have been popping/muffled denied discharge/pain or bleeding  Still using sudafed, robitussin and tessalon pearles also.      Review of Systems  Constitutional:  Negative for chills and fever.  HENT:  Positive for congestion, hearing loss, postnasal drip and sinus pressure. Negative for facial swelling, mouth sores, nosebleeds, rhinorrhea, sneezing, sore throat, trouble swallowing and voice change.   Eyes:  Negative for photophobia and visual disturbance.  Respiratory:  Positive for cough. Negative for shortness of breath, wheezing and stridor.   Cardiovascular:  Negative for chest pain and palpitations.  Gastrointestinal:  Negative for diarrhea, nausea and vomiting.  Genitourinary:  Negative for difficulty urinating.  Musculoskeletal:  Negative for back pain, gait problem, myalgias, neck pain and neck stiffness.  Neurological:  Negative for dizziness, tremors, syncope, facial asymmetry, speech difficulty, weakness, light-headedness and headaches.  Hematological:  Negative for adenopathy.  Psychiatric/Behavioral:  Positive for sleep disturbance. Negative for agitation and confusion.        Objective:   Physical Exam Vitals reviewed.  Constitutional:      General: She is awake. She is not in acute distress.    Appearance: Normal appearance. She is well-developed and well-groomed. She is obese. She is not ill-appearing, toxic-appearing or diaphoretic.  HENT:     Head: Normocephalic and atraumatic.     Jaw: There is normal jaw occlusion.     Salivary Glands: Right salivary gland is not diffusely enlarged or tender. Left salivary gland is not diffusely enlarged or  tender.     Right Ear: Hearing, ear canal and external ear normal. No decreased hearing noted. No laceration, drainage, swelling or tenderness. A middle ear effusion is present. There is no impacted cerumen. No foreign body. No mastoid tenderness. No PE tube. No hemotympanum. Tympanic membrane is not injected, scarred, perforated, erythematous, retracted or bulging.     Left Ear: Hearing, ear canal and external ear normal. No decreased hearing noted. No laceration, drainage, swelling or tenderness. A middle ear effusion is present. There is no impacted cerumen. No foreign body. No mastoid tenderness. No PE tube. No hemotympanum. Tympanic membrane is not injected, scarred, perforated, erythematous, retracted or bulging.     Ears:     Comments: Bilateral TMs intact air fluid level clear; no debris noted in canals    Nose: Nose normal. No congestion or rhinorrhea.     Right Nostril: No epistaxis.     Left Nostril: No epistaxis.     Right Turbinates: Enlarged and swollen. Not pale.     Left Turbinates: Enlarged and swollen. Not pale.     Right Sinus: No maxillary sinus tenderness or frontal sinus tenderness.     Left Sinus: No maxillary sinus tenderness or frontal sinus tenderness.     Comments: Bilateral allergic shiners; cobblestoning posterior pharynx; clear discharge bilateral nasal turbinates    Mouth/Throat:     Lips: Pink. No lesions.     Mouth: Mucous membranes are moist. No oral lesions or angioedema.     Dentition: No gum lesions.     Tongue: No lesions. Tongue does not deviate from midline.     Palate: No mass and lesions.  Pharynx: Uvula midline. Pharyngeal swelling and postnasal drip present. No oropharyngeal exudate, posterior oropharyngeal erythema or uvula swelling.     Tonsils: No tonsillar exudate.  Eyes:     General: Lids are normal. Vision grossly intact. Gaze aligned appropriately. Allergic shiner present. No scleral icterus.       Right eye: No discharge.        Left eye:  No discharge.     Extraocular Movements: Extraocular movements intact.     Conjunctiva/sclera: Conjunctivae normal.     Pupils: Pupils are equal, round, and reactive to light.  Neck:     Trachea: Trachea and phonation normal.  Cardiovascular:     Rate and Rhythm: Normal rate and regular rhythm.     Pulses: Normal pulses.          Radial pulses are 2+ on the right side and 2+ on the left side.     Heart sounds: Normal heart sounds, S1 normal and S2 normal.  Pulmonary:     Effort: Pulmonary effort is normal. No respiratory distress.     Breath sounds: Normal breath sounds and air entry. No stridor or transmitted upper airway sounds. No decreased breath sounds, wheezing or rhonchi.     Comments: Spoke full sentences without difficulty; no cough observed in exam room Abdominal:     General: Abdomen is flat.  Musculoskeletal:        General: Normal range of motion.     Right hand: Normal capillary refill.     Left hand: Normal capillary refill.     Cervical back: Normal range of motion and neck supple. No swelling, edema, deformity, erythema, signs of trauma, lacerations, rigidity, spasms, torticollis, tenderness, bony tenderness or crepitus. No pain with movement or muscular tenderness. Normal range of motion.     Thoracic back: No swelling, edema, deformity, signs of trauma, lacerations, spasms or tenderness. Normal range of motion.     Right lower leg: No edema.     Left lower leg: No edema.  Lymphadenopathy:     Head:     Right side of head: No submental, submandibular, tonsillar, preauricular, posterior auricular or occipital adenopathy.     Left side of head: No submental, submandibular, tonsillar, preauricular, posterior auricular or occipital adenopathy.     Cervical: No cervical adenopathy.     Right cervical: No superficial, deep or posterior cervical adenopathy.    Left cervical: No superficial, deep or posterior cervical adenopathy.  Skin:    General: Skin is warm and dry.      Capillary Refill: Capillary refill takes less than 2 seconds.     Coloration: Skin is not ashen, cyanotic, jaundiced, mottled, pale or sallow.     Findings: No abrasion, abscess, acne, bruising, burn, ecchymosis, erythema, signs of injury, laceration, lesion, petechiae, rash or wound.     Nails: There is no clubbing.  Neurological:     General: No focal deficit present.     Mental Status: She is alert and oriented to person, place, and time. Mental status is at baseline.     GCS: GCS eye subscore is 4. GCS verbal subscore is 5. GCS motor subscore is 6.     Cranial Nerves: Cranial nerves 2-12 are intact. No cranial nerve deficit, dysarthria or facial asymmetry.     Motor: Motor function is intact. No weakness, tremor, atrophy, abnormal muscle tone or seizure activity.     Coordination: Coordination is intact. Coordination normal.     Gait: Gait is intact.  Gait normal.     Comments: In/out of chair without difficulty; gait sure and steady in clinic; bilateral hand grasp equal 5/5  Psychiatric:        Attention and Perception: Attention and perception normal.        Mood and Affect: Mood and affect normal.        Speech: Speech normal.        Behavior: Behavior normal. Behavior is cooperative.        Thought Content: Thought content normal.        Cognition and Memory: Cognition and memory normal.        Judgment: Judgment normal.          Assessment & Plan:   A-viral URI with cough, eustachian tube dysfunction bilateral    P-Continue hydrating with water.  Shower daily.  Trying to get 7-8 hours sleep per night.  Eating fruits, vegetables and lean proteins daily.  May continue robitussin, sudafed, tessalon pearles and promethazine prn cough during sleep.  Follow up re-evaluation if new or worsening symptoms especially hemoptysis, fever/chills, chest pain or wheezing/dyspnea.  Patient improving sp02 stable BBS CTA today.  Patient agreed with plan of care and had no further questions at  this time.   No evidence of invasive bacterial infection, non toxic and well hydrated.  I do not see where any further testing or imaging is necessary at this time.   I will suggest supportive care, rest, good hygiene and encourage the patient to take adequate fluids.  The patient is to return to clinic or EMERGENCY ROOM if symptoms worsen or change significantly e.g. ear pain, fever, purulent discharge from ears or bleeding.  Exitcare handout on  eustachian tube dysfunction Discussed with patient post nasal drip irritates throat/causes swelling blocks eustachian tubes from draining and fluid fills up middle ear.  Bacteria/viruses can grow in fluid and with moving head tube compressed and increases pressure in tube/ear worsening pain.  Studies show will take 30 days for fluid to resolve after post nasal drip controlled with nasal steroid/antihistamine. Antibiotics and steroids do not speed up fluid removal.  Patient verbalized agreement and understanding of treatment plan and had no further questions at this time.

## 2024-01-03 ENCOUNTER — Other Ambulatory Visit: Payer: Self-pay | Admitting: Internal Medicine

## 2024-01-03 ENCOUNTER — Ambulatory Visit
Admission: RE | Admit: 2024-01-03 | Discharge: 2024-01-03 | Disposition: A | Discharge status: Home / Self Care | Payer: No Typology Code available for payment source | Source: Ambulatory Visit | Attending: Internal Medicine | Admitting: Internal Medicine

## 2024-01-03 DIAGNOSIS — R928 Other abnormal and inconclusive findings on diagnostic imaging of breast: Secondary | ICD-10-CM

## 2024-01-03 DIAGNOSIS — N6320 Unspecified lump in the left breast, unspecified quadrant: Secondary | ICD-10-CM

## 2024-01-09 ENCOUNTER — Ambulatory Visit
Admission: RE | Admit: 2024-01-09 | Discharge: 2024-01-09 | Disposition: A | Discharge status: Home / Self Care | Payer: No Typology Code available for payment source | Source: Ambulatory Visit | Attending: Internal Medicine | Admitting: Internal Medicine

## 2024-01-09 ENCOUNTER — Other Ambulatory Visit: Payer: Self-pay | Admitting: Diagnostic Radiology

## 2024-01-09 DIAGNOSIS — N6320 Unspecified lump in the left breast, unspecified quadrant: Secondary | ICD-10-CM

## 2024-01-09 HISTORY — PX: BREAST BIOPSY: SHX20

## 2024-01-10 ENCOUNTER — Encounter: Payer: Self-pay | Admitting: Internal Medicine

## 2024-01-10 ENCOUNTER — Other Ambulatory Visit: Payer: Self-pay | Admitting: Internal Medicine

## 2024-01-10 DIAGNOSIS — R928 Other abnormal and inconclusive findings on diagnostic imaging of breast: Secondary | ICD-10-CM

## 2024-01-10 DIAGNOSIS — N6320 Unspecified lump in the left breast, unspecified quadrant: Secondary | ICD-10-CM

## 2024-01-10 LAB — SURGICAL PATHOLOGY

## 2024-01-18 ENCOUNTER — Ambulatory Visit
Admission: RE | Admit: 2024-01-18 | Discharge: 2024-01-18 | Disposition: A | Discharge status: Home / Self Care | Payer: No Typology Code available for payment source | Source: Ambulatory Visit | Attending: Internal Medicine | Admitting: Internal Medicine

## 2024-01-18 ENCOUNTER — Other Ambulatory Visit: Payer: Self-pay | Admitting: Internal Medicine

## 2024-01-18 ENCOUNTER — Encounter: Payer: Self-pay | Admitting: Internal Medicine

## 2024-01-18 ENCOUNTER — Ambulatory Visit
Admission: RE | Admit: 2024-01-18 | Discharge: 2024-01-18 | Disposition: A | Discharge status: Home / Self Care | Source: Ambulatory Visit | Attending: Internal Medicine | Admitting: Internal Medicine

## 2024-01-18 DIAGNOSIS — N6320 Unspecified lump in the left breast, unspecified quadrant: Secondary | ICD-10-CM

## 2024-01-18 DIAGNOSIS — R928 Other abnormal and inconclusive findings on diagnostic imaging of breast: Secondary | ICD-10-CM

## 2024-01-19 ENCOUNTER — Other Ambulatory Visit: Payer: Self-pay | Admitting: Internal Medicine

## 2024-01-19 DIAGNOSIS — N6320 Unspecified lump in the left breast, unspecified quadrant: Secondary | ICD-10-CM

## 2024-01-31 ENCOUNTER — Ambulatory Visit: Admitting: Registered Nurse

## 2024-01-31 VITALS — BP 178/97 | HR 98 | Temp 98.2°F

## 2024-01-31 DIAGNOSIS — R030 Elevated blood-pressure reading, without diagnosis of hypertension: Secondary | ICD-10-CM

## 2024-01-31 DIAGNOSIS — J301 Allergic rhinitis due to pollen: Secondary | ICD-10-CM

## 2024-01-31 DIAGNOSIS — Z0000 Encounter for general adult medical examination without abnormal findings: Secondary | ICD-10-CM

## 2024-01-31 DIAGNOSIS — H1013 Acute atopic conjunctivitis, bilateral: Secondary | ICD-10-CM

## 2024-01-31 DIAGNOSIS — H00011 Hordeolum externum right upper eyelid: Secondary | ICD-10-CM

## 2024-01-31 MED ORDER — FEXOFENADINE HCL 180 MG PO TABS: 180.0000 mg | ORAL_TABLET | Freq: Every day | ORAL | Status: DC

## 2024-01-31 MED ORDER — SALINE SPRAY 0.65 % NA SOLN: 2.0000 | NASAL | Status: DC

## 2024-01-31 MED ORDER — CARBOXYMETHYLCELLULOSE SOD PF 0.5 % OP SOLN: 1.0000 [drp] | Freq: Three times a day (TID) | OPHTHALMIC | Status: AC | PRN

## 2024-01-31 MED ORDER — ERYTHROMYCIN 5 MG/GM OP OINT: 1.0000 | TOPICAL_OINTMENT | Freq: Two times a day (BID) | OPHTHALMIC | Status: AC

## 2024-01-31 NOTE — Progress Notes (Unsigned)
 Employee in to see NP for rash on right eye and headache.

## 2024-01-31 NOTE — Progress Notes (Unsigned)
   Subjective:    Patient ID: Sara Cook, female    DOB: 1966-06-20, 58 y.o.   MRN: [id number]  HPI    Review of Systems     Objective:   Physical Exam   Patient returned for BP recheck with RN Olegario Messier improved.  Patient stated has been urinating this afternoon since starting hydrochlorothiazide and felt a little flushed/red in face this afternoon.  Headache has resolved after tylenol and hydrochlorothiazide also.     Assessment & Plan:   Patient signed Be Well paperwork BP 138/80 PCM 09/15/23 LDL 129 Hgba1c 5.7 height 5'7" weight 261lbs bmi 40 signed tobacco attestation.  HR given form met requirements for insurance discount starting 15 Aug 2024.  Given nasal saline from clinic stock, tylenol, claritin, hydrochlorothiazide, refresh, erythromycin from [facility]

## 2024-01-31 NOTE — Patient Instructions (Signed)
 Sinus Pain  Sinus pain may occur when your sinuses become clogged or swollen. Sinuses are air-filled spaces in your skull that are behind the bones of your face and forehead. Sinus pain can range from mild to severe. What are the causes? Sinus pain can result from various conditions that affect the sinuses. Common causes include: Colds. Sinus infections. Allergies. What are the signs or symptoms? The main symptom of this condition is pain or pressure in your face, forehead, ears, or upper teeth. People who have sinus pain often have other symptoms, such as: Congested or runny nose. Fever. Inability to smell. Headache. Weather changes can make symptoms worse. How is this diagnosed? Your health care provider will diagnose this condition based on your symptoms and a physical exam. If you have pain that keeps coming back or does not go away, your health care provider may recommend more testing. This may include: Imaging tests, such as a CT scan or MRI, to check for problems with your sinuses. Examination of your sinuses using a thin tool with a camera that is inserted through your nose (endoscopy). How is this treated? Treatment for this condition depends on the cause. Sinus pain that is caused by a sinus infection may be treated with antibiotic medicine. Sinus pain that is caused by congestion may be helped by rinsing out (flushing) the nose and sinuses with saline solution. Sinus pain that is caused by allergies may be helped by allergy medicines (antihistamines) and medicated nasal sprays. Sinus surgery may be needed in some cases if other treatments do not help. Follow these instructions at home: General instructions If directed: Apply a warm, moist washcloth to your face to help relieve pain. Use a nasal saline wash. Follow the directions on the bottle or box. Hydrate and humidify Drink enough water to keep your urine clear or pale yellow. Staying hydrated will help to thin your  mucus. Use a humidifier if your home is dry. Inhale steam for 10-15 minutes, 3-4 times a day or as told by your health care provider. You can do this in the bathroom while a hot shower is running. Limit your exposure to cool or dry air. Medicines  Take over-the-counter and prescription medicines only as told by your health care provider. If you were prescribed an antibiotic medicine, take it as told by your health care provider. Do not stop taking the antibiotic even if you start to feel better. If you have congestion, use a nasal spray to help lessen pressure. Contact a health care provider if: You have sinus pain more than one time a week. You have sensitivity to light or sound. You develop a fever. You feel nauseous or you vomit. Your sinus pain or headache does not get better with treatment. Get help right away if: You have vision problems. You have sudden, severe pain in your face or head. You have a seizure. You are confused. You have a stiff neck. Summary Sinus pain occurs when your sinuses become clogged or swollen. Sinus pain can result from various conditions that affect the sinuses, such as a cold, a sinus infection, or an allergy. Treatment for this condition depends on the cause. It may include medicine, such as antibiotics or antihistamines. This information is not intended to replace advice given to you by your health care provider. Make sure you discuss any questions you have with your health care provider. Document Revised: 10/04/2021 Document Reviewed: 10/04/2021 Elsevier Patient Education  2024 [employer] Inc.How to Perform a Sinus Rinse A  sinus rinse is a home treatment that is used to rinse your sinuses with a germ-free (sterile) mixture of salt and water (saline solution). Sinuses are air-filled spaces in your skull that are behind the bones of your face and forehead. They open into your nasal cavity. A sinus rinse can help to clear mucus, dirt, dust, or pollen from  your nasal cavity. You may do a sinus rinse when you have a cold, a virus, nasal allergy symptoms, a sinus infection, or stuffiness in your nose or sinuses. What are the risks? A sinus rinse is generally safe and effective. However, there are a few risks, which include: A burning sensation in your sinuses. This may happen if you do not make the saline solution as directed. Be sure to follow all directions when making the saline solution. Nasal irritation. Infection. This may be from unclean supplies or from contaminated water. Infection from contaminated water is rare, but possible. Do not do a sinus rinse if you have had ear or nasal surgery, ear infection, or plugged ears, unless recommended by your health care provider. Supplies needed: Saline solution or powder. Distilled or sterile water to mix with saline powder. You may use boiled and cooled tap water. Boil tap water for 5 minutes; cool until it is lukewarm. Use within 24 hours. Do not use regular tap water to mix with the saline solution. Neti pot or nasal rinse bottle. These supplies release the saline solution into your nose and through your sinuses. Neti pots and nasal rinse bottles can be purchased at [employer], a health food store, or online. How to perform a sinus rinse  Wash your hands with soap and water for at least 20 seconds. If soap and water are not available, use hand sanitizer. Wash your device according to the directions that came with the product and then dry it. Use the solution that comes with your product or one that is sold separately in stores. Follow the mixing directions on the package to mix with sterile or distilled water. Fill the device with the amount of saline solution noted in the device instructions. Stand by a sink and tilt your head sideways over the sink. Place the spout of the device in your upper nostril (the one closer to the ceiling). Gently pour or squeeze the saline solution into your  nasal cavity. The liquid should drain out from the lower nostril if you are not too congested. While rinsing, breathe through your open mouth. Gently blow your nose to clear any mucus and rinse solution. Blowing too hard may cause ear pain. Turn your head in the other direction and repeat in your other nostril. Clean and rinse your device with clean water and then air-dry it. Talk with your health care provider or pharmacist if you have questions about how to do a sinus rinse. Summary A sinus rinse is a home treatment that is used to rinse your sinuses with a sterile mixture of salt and water (saline solution). You may do a sinus rinse when you have a cold, a virus, nasal allergy symptoms, a sinus infection, or stuffiness in your nose or sinuses. A sinus rinse is generally safe and effective. Follow all instructions carefully. This information is not intended to replace advice given to you by your health care provider. Make sure you discuss any questions you have with your health care provider. Document Revised: 04/20/2021 Document Reviewed: 04/20/2021 Elsevier Patient Education  2024 [employer].Preventing Hypertension Hypertension, also called high blood pressure,  is when the force of blood pumping through the arteries is too strong. Arteries are blood vessels that carry blood from the heart throughout the body. Often, hypertension does not cause symptoms until blood pressure is very high. It is important to have your blood pressure checked regularly. Diet and lifestyle changes can help you prevent hypertension, and they may make you feel better overall and improve your quality of life. If you already have hypertension, you may control it with diet and lifestyle changes, as well as with medicine. How can this condition affect me? Over time, hypertension can damage the arteries and decrease blood flow to important parts of the body, including the brain, heart, and kidneys. By keeping your blood  pressure in a healthy range, you can help prevent complications like heart attack, heart failure, stroke, kidney failure, and vascular dementia. What can increase my risk? An unhealthy diet and a lack of physical activity can make you more likely to develop high blood pressure. Some other risk factors include: Age. The risk increases with age. Having family members who have had high blood pressure. Having certain health conditions, such as thyroid problems. Being overweight or obese. Drinking too much alcohol or caffeine. Having too much fat, sugar, calories, or salt (sodium) in your diet. Smoking or using illegal drugs. Taking certain medicines, such as antidepressants, decongestants, birth control pills, and NSAIDs, such as ibuprofen. What actions can I take to prevent or manage this condition? Work with your health care provider to make a hypertension prevention plan that works for you. You may be referred for counseling on a healthy diet and physical activity. Follow your plan and keep all follow-up visits. Diet changes Maintain a healthy diet. This includes: Eating less salt (sodium). Ask your health care provider how much sodium is safe for you to have. The general recommendation is to have less than 1 tsp (2,300 mg) of sodium a day. Do not add salt to your food. Choose low-sodium options when grocery shopping and eating out. Limiting fats in your diet. You can do this by eating low-fat or fat-free dairy products and by eating less red meat. Eating more fruits, vegetables, and whole grains. Make a goal to eat: 1-2 cups of fresh fruits and vegetables each day. 3-4 servings of whole grains each day. Avoiding foods and beverages that have added sugars. Eating fish that contain healthy fats (omega-3 fatty acids), such as mackerel or salmon. If you need help putting together a healthy eating plan, try the DASH diet. This diet is high in fruits, vegetables, and whole grains. It is low in  sodium, red meat, and added sugars. DASH stands for Dietary Approaches to Stop Hypertension. Lifestyle changes  Lose weight if you are overweight. Losing just 3-5% of your body weight can help prevent or control hypertension. For example, if your present weight is 200 lb (91 kg), a loss of 3-5% of your weight means losing 6-10 lb (2.7-4.5 kg). Ask your health care provider to help you with a diet and exercise plan to safely lose weight. Get enough exercise. Do at least 150 minutes of moderate-intensity exercise each week. You could do this in short exercise sessions several times a day, or you could do longer exercise sessions a few times a week. For example, you could take a brisk 10-minute walk or bike ride, 3 times a day, for 5 days a week. Find ways to reduce stress, such as exercising, meditating, listening to music, or taking a yoga class. If  you need help reducing stress, ask your health care provider. Do not use any products that contain nicotine or tobacco. These products include cigarettes, chewing tobacco, and vaping devices, such as e-cigarettes. Chemicals in tobacco and nicotine products raise your blood pressure each time you use them. If you need help quitting, ask your health care provider. Learn how to check your blood pressure at home. Make sure that you know your personal target blood pressure, as told by your health care provider. Try to sleep 7-9 hours per night. Alcohol use Do not drink alcohol if: Your health care provider tells you not to drink. You are pregnant, may be pregnant, or are planning to become pregnant. If you drink alcohol: Limit how much you have to: 0-1 drink a day for women. 0-2 drinks a day for men. Know how much alcohol is in your drink. In the [place] one drink equals one 12 oz bottle of beer (355 mL), one 5 oz glass of wine (148 mL), or one 1 oz glass of hard liquor (44 mL). Medicines In addition to diet and lifestyle changes, your health care provider  may recommend medicines to help lower your blood pressure. In general: You may need to try a few different medicines to find what works best for you. You may need to take more than one medicine. Take over-the-counter and prescription medicines only as told by your health care provider. Questions to ask your health care provider What is my blood pressure goal? How can I lower my risk for high blood pressure? How should I monitor my blood pressure at home? Where to find support Your health care provider can help you prevent hypertension and help you keep your blood pressure at a healthy level. Your local hospital or your community may also provide support services and prevention programs. The American Heart Association offers an online support network at supportnetwork.heart.org Where to find more information Learn more about hypertension from: National Heart, Lung, and Blood Institute: PopSteam.is Centers for Disease Control and Prevention: FootballExhibition.com.br American Academy of Family Physicians: familydoctor.org Learn more about the DASH diet from: National Heart, Lung, and Blood Institute: PopSteam.is Contact a health care provider if: You think you are having a reaction to medicines you have taken. You have recurrent headaches or feel dizzy. You have swelling in your ankles. You have trouble with your vision. Get help right away if: You have sudden, severe chest, back, or abdominal pain or discomfort. You have shortness of breath. You have a sudden, severe headache. These symptoms may be an emergency. Get help right away. Call 911. Do not wait to see if the symptoms will go away. Do not drive yourself to the hospital. Summary Hypertension often does not cause any symptoms until blood pressure is very high. It is important to get your blood pressure checked regularly. Diet and lifestyle changes are important steps in preventing hypertension. By keeping your blood pressure in a  healthy range, you may prevent complications like heart attack, heart failure, stroke, and kidney failure. Work with your health care provider to make a hypertension prevention plan that works for you. This information is not intended to replace advice given to you by your health care provider. Make sure you discuss any questions you have with your health care provider. Document Revised: 08/20/2021 Document Reviewed: 08/20/2021 Elsevier Patient Education  2024 Else[employer] Inc.Stye A stye, also known as a hordeolum, is a bump that forms on an eyelid. It may look like a pimple next to the  eyelash. A stye can form inside the eyelid (internal stye) or outside the eyelid (external stye). A stye can cause redness, swelling, and pain on the eyelid. Styes are very common. Anyone can get them at any age. They usually occur in just one eye at a time, but you may have more than one in either eye. What are the causes? A stye is caused by an infection. The infection is almost always caused by bacteria called Staphylococcus aureus. This is a common type of bacteria that lives on the skin. An internal stye may result from an infected oil-producing gland inside the eyelid. An external stye may be caused by an infection at the base of the eyelash (hair follicle). What increases the risk? You are more likely to develop a stye if: You have had a stye before. You have any of these conditions: Red, itchy, inflamed eyelids (blepharitis). A skin condition such as seborrheic dermatitis or rosacea. High fat levels in your blood (lipids). Dry eyes. What are the signs or symptoms? The most common symptom of a stye is eyelid pain. Internal styes are more painful than external styes. Other symptoms may include: Painful swelling of your eyelid. A scratchy feeling in your eye. Tearing and redness of your eye. A pimple-like bump on the edge of the eyelid. Pus draining from the stye. How is this diagnosed? Your health care  provider may be able to diagnose a stye just by examining your eye. The health care provider may also check to make sure: You do not have a fever or other signs of a more serious infection. The infection has not spread to other parts of your eye or areas around your eye. How is this treated? Most styes will clear up in a few days without treatment or with warm compresses applied to the area. You may need to use antibiotic drops or ointment to treat an infection. Sometimes, steroid drops or ointment are used in addition to antibiotics. In some cases, your health care provider may give you a small steroid injection in the eyelid. If your stye does not heal with routine treatment, your health care provider may drain pus from the stye using a thin blade or needle. This may be done if the stye is large, causing a lot of pain, or affecting your vision. Follow these instructions at home: Take over-the-counter and prescription medicines only as told by your health care provider. This includes eye drops or ointments. If you were prescribed an antibiotic medicine, steroid medicine, or both, apply or use them as told by your health care provider. Do not stop using the medicine even if your condition improves. Apply a warm, wet cloth (warm compress) to your eye for 5-10 minutes, 4 to 6 times a day. Clean the affected eyelid as directed by your health care provider. Do not wear contact lenses or eye makeup until your stye has healed and your health care provider says that it is safe. Do not try to pop or drain the stye. Do not rub your eye. Contact a health care provider if: You have chills or a fever. Your stye does not go away after several days. Your stye affects your vision. Your eyeball becomes swollen, red, or painful. Get help right away if: You have pain when moving your eye around. Summary A stye is a bump that forms on an eyelid. It may look like a pimple next to the eyelash. A stye can form  inside the eyelid (internal stye) or outside  the eyelid (external stye). A stye can cause redness, swelling, and pain on the eyelid. Your health care provider may be able to diagnose a stye just by examining your eye. Apply a warm, wet cloth (warm compress) to your eye for 5-10 minutes, 4 to 6 times a day. This information is not intended to replace advice given to you by your health care provider. Make sure you discuss any questions you have with your health care provider. Document Revised: 01/07/2021 Document Reviewed: 01/07/2021 Elsevier Patient Education  2024 [employer].

## 2024-02-01 ENCOUNTER — Ambulatory Visit

## 2024-02-01 VITALS — BP 129/84 | HR 90

## 2024-02-01 DIAGNOSIS — R030 Elevated blood-pressure reading, without diagnosis of hypertension: Secondary | ICD-10-CM

## 2024-02-01 NOTE — Progress Notes (Signed)
 Pt states she feels much better than she did yesterday. Stated she did not want a covid test. She asked if she should cont with prescribed meds and was instructed to follow NP orders.

## 2024-02-02 ENCOUNTER — Ambulatory Visit

## 2024-02-02 VITALS — BP 125/83

## 2024-02-02 DIAGNOSIS — R030 Elevated blood-pressure reading, without diagnosis of hypertension: Secondary | ICD-10-CM

## 2024-02-04 ENCOUNTER — Encounter: Payer: Self-pay | Admitting: Registered Nurse

## 2024-02-04 ENCOUNTER — Telehealth: Payer: Self-pay | Admitting: Registered Nurse

## 2024-02-04 DIAGNOSIS — I10 Essential (primary) hypertension: Secondary | ICD-10-CM

## 2024-02-04 NOTE — Telephone Encounter (Signed)
 Patient in to see RN Olegario Messier for BP recheck.    BP 125/83 (BP Location: Left Arm, Patient Position: Sitting, Cuff Size: Normal)  Took hydrochlorothiazide 12.5mg  po daily since 01/31/2024.  Headache resolved and feeling better but very fatigued after work end of day.  Taking nap after she gets home and wondering if she should stop hydrochlorothiazide. She had not been taking nap after work typically.   Discussed blood pressure still not at preferred goal 110s/70s and body may need time to acclimate to new lower blood pressure and feeling of fatigue/sluggishness common as body acclimates and should resolve.  Patient denied dizzyness/lightheadedness/muscle cramps/weakness..  I do recommend that she continues blood pressure medication.  If symptoms not improving will consider decreasing dose to 1/2 tab.  Discussed with patient to see RN Olegario Messier for BP recheck if symptoms worsening at work in afternoon.  Patient A&Ox3 gait sure and steady respirations even and unlabored RA spoke full sentences without difficulty  Patient agreed with plan of care and had no further questions at this time.

## 2024-02-07 ENCOUNTER — Ambulatory Visit

## 2024-02-07 VITALS — BP 136/82

## 2024-02-07 DIAGNOSIS — R030 Elevated blood-pressure reading, without diagnosis of hypertension: Secondary | ICD-10-CM

## 2024-02-07 NOTE — Progress Notes (Signed)
F/U BP.

## 2024-02-08 ENCOUNTER — Ambulatory Visit

## 2024-02-08 VITALS — BP 145/87

## 2024-02-08 DIAGNOSIS — R030 Elevated blood-pressure reading, without diagnosis of hypertension: Secondary | ICD-10-CM

## 2024-02-09 ENCOUNTER — Ambulatory Visit

## 2024-02-09 VITALS — BP 128/92

## 2024-02-09 DIAGNOSIS — R030 Elevated blood-pressure reading, without diagnosis of hypertension: Secondary | ICD-10-CM

## 2024-02-10 NOTE — Telephone Encounter (Signed)
 BP with RN Olegario Messier 128/92 today 02/08/24 145/87 02/07/24  136/82 patient reported has not been as fatigued this week taking hydrochlorothiazide 12.5mg  po daily and has noticed less lower leg swelling after work.  Has not had headache this week.  She reported she was not having headache daily prior to hydrochlorothiazide but typically at least weekly.  Patient asking again if she needs to continue medications discussed yes her DBP not at goal see RN Olegario Messier once a week for next 2 weeks and if still DBP greater than 80 will increase her hydrochlorothiazide to 25mg  daily.  Patient A&Ox3 no pedal edema skin warm dry and pink gait sure and steady respirations even and unlabored RA  Patient verbalized understanding of instructions agreed with plan of care and had no further questions at this time.

## 2024-02-14 ENCOUNTER — Ambulatory Visit

## 2024-02-14 VITALS — BP 143/87

## 2024-02-14 DIAGNOSIS — R030 Elevated blood-pressure reading, without diagnosis of hypertension: Secondary | ICD-10-CM

## 2024-02-14 NOTE — Progress Notes (Signed)
 BP check

## 2024-02-15 MED ORDER — HYDROCHLOROTHIAZIDE 12.5 MG PO TABS: 25.0000 mg | ORAL_TABLET | Freq: Every day | ORAL | Status: DC

## 2024-02-15 NOTE — Telephone Encounter (Signed)
 BP 143/87 with RN Olegario Messier today.  Discussed with patient BP still not at goal will need to adjust her medication.  Patient denied fatigue or dizziness with medication in the past week and denied further questions.  Instructed patient to start taking 2 Hydrochlorothiazide tablets 12.5mg  daily if taking both at same time making her too fatigued or dizzy may splint breakfast/lunch and follow up in 1 week for BP recheck with RN Olegario Messier.  Patient verbalized understanding information/instructions, agreed with plan of care and had no further questions at this time.

## 2024-02-21 ENCOUNTER — Ambulatory Visit

## 2024-02-21 VITALS — BP 128/90

## 2024-02-21 DIAGNOSIS — R030 Elevated blood-pressure reading, without diagnosis of hypertension: Secondary | ICD-10-CM

## 2024-02-28 ENCOUNTER — Ambulatory Visit

## 2024-02-28 VITALS — BP 125/85

## 2024-02-28 DIAGNOSIS — R030 Elevated blood-pressure reading, without diagnosis of hypertension: Secondary | ICD-10-CM

## 2024-02-28 NOTE — Progress Notes (Signed)
 BP check

## 2024-03-08 ENCOUNTER — Encounter: Payer: Self-pay | Admitting: Registered Nurse

## 2024-03-08 ENCOUNTER — Ambulatory Visit: Admitting: Registered Nurse

## 2024-03-08 VITALS — Resp 16

## 2024-03-08 DIAGNOSIS — L247 Irritant contact dermatitis due to plants, except food: Secondary | ICD-10-CM

## 2024-03-08 MED ORDER — PREDNISONE 10 MG PO TABS: ORAL_TABLET | ORAL | 0 refills | Status: AC

## 2024-03-08 NOTE — Patient Instructions (Signed)
Poison Ivy Dermatitis Poison ivy dermatitis is irritation and swelling (inflammation) of the skin caused by chemicals in the leaves of the poison ivy plant. The skin reaction often involves redness, blisters, and extreme itching. What are the causes? This condition is caused by a chemical (urushiol) found in the sap of the poison ivy plant. This chemical is sticky and can easily spread to people, animals, and objects. You can get poison ivy dermatitis by: Having direct contact with a poison ivy plant. Touching animals, other people, or objects that have come in contact with poison ivy and have the chemical on them. What increases the risk? This condition is more likely to develop in people who: Are outdoors often in wooded or marshy areas. Go outdoors without wearing protective clothing, such as closed shoes, long pants, and a long-sleeved shirt. What are the signs or symptoms? Symptoms of this condition include: Redness of the skin. Extreme itching. A rash that often includes bumps and blisters. The rash usually appears 48 hours after exposure, if you have been exposed before. If this is the first time you have been exposed, the rash may not appear until a week after exposure. Swelling. This may occur if the reaction is more severe. Symptoms usually last for 1-2 weeks. However, the first time you develop this condition, symptoms may last 3-4 weeks. How is this diagnosed? This condition may be diagnosed based on your symptoms and a physical exam. Your health care provider may also ask you about any recent outdoor activity. How is this treated? Treatment for this condition will vary depending on how severe it is. Treatment may include: Hydrocortisone cream or calamine lotion to relieve itching. Oatmeal baths to soothe the skin. Medicines, such as over-the-counter antihistamine tablets. Oral or injected steroid medicine, for more severe reactions. Follow these instructions at  home: Medicines Take or apply over-the-counter and prescription medicines only as told by your health care provider. Use hydrocortisone cream or calamine lotion as needed to soothe the skin and relieve itching. General instructions Do not scratch or rub your skin. Apply a cold, wet cloth (cold compress) to the affected areas or take baths in cool water. This will help with itching. Avoid hot baths and showers. Take oatmeal baths as needed. Use colloidal oatmeal. You can get this at your local pharmacy or grocery store. Follow the instructions on the packaging. Wash all clothes, bedsheets, towels, and blankets you were in contact with between your exposure and appearance of the rash. Check the affected area every day for signs of infection. Check for: More redness, swelling, or pain. Fluid or blood. Warmth. Pus or a bad smell. Keep all follow-up visits. Your health care provider may want to see how your skin is progressing with treatment. How is this prevented?  Learn to identify the poison ivy plant and avoid contact with the plant. This plant can be recognized by the number of leaves. Generally, poison ivy has three leaves with flowering branches on a single stem. The leaves are typically glossy, and they have jagged edges that come to a point. If you have been exposed to poison ivy, thoroughly wash with soap and water right away. You have about 30 minutes to remove the plant resin before it will cause the rash. Be sure to wash under your fingernails, because any plant resin there will continue to spread the rash. When hiking or camping, wear clothes that will help you to avoid skin exposure. This includes long pants, a long-sleeved shirt, long socks,   and hiking boots. You can also apply preventive lotion to your skin to help limit exposure. If you suspect that your clothes or outdoor gear came in contact with poison ivy, rinse them off outside with a garden hose before you bring them inside  your house. When doing yard work or gardening, wear gloves, long sleeves, long pants, and boots. Wash your garden tools and gloves if they come in contact with poison ivy. If you suspect that your pet has come into contact with poison ivy, wash them with pet shampoo and water. Make sure to wear gloves while washing your pet. Contact a health care provider if: You have open sores in the rash area. You have any signs of infection. You have redness that spreads beyond the rash area. You have a fever. You have a rash over a large area of your body. You have a rash on your eyes, mouth, or genitals. You have a rash that does not improve after a few weeks. Get help right away if: Your face swells or your eyes swell shut. You have trouble breathing. You have trouble swallowing. These symptoms may be an emergency. Get help right away. Call 911. Do not wait to see if the symptoms will go away. Do not drive yourself to the hospital. This information is not intended to replace advice given to you by your health care provider. Make sure you discuss any questions you have with your health care provider. Document Revised: 04/01/2022 Document Reviewed: 04/01/2022 Elsevier Patient Education  2024 [employer]  

## 2024-03-08 NOTE — Progress Notes (Signed)
 Established Patient Office Visit  Subjective   Patient ID: Sara Cook, female    DOB: 28-Feb-1966  Age: 58 y.o. MRN: [id number]  Chief Complaint  Patient presents with   Rash    Bilateral arms worsening new spots keep popping up I think it is poison ivy    57y/o caucasian female rash started on bilateral forearms earlier this week red itchy dry spreading.  Was working in the yard this weekend and thinks she has poison ivy.    Denied fever/chills/n/v/d/wheezing/dysphagia/dysphasia      Review of Systems  Constitutional:  Negative for chills, diaphoresis and fever.  Eyes:  Negative for blurred vision, double vision, photophobia, pain, discharge and redness.  Respiratory:  Negative for cough, shortness of breath and wheezing.   Cardiovascular:  Negative for chest pain.  Gastrointestinal:  Negative for diarrhea and vomiting.  Genitourinary:  Negative for dysuria.  Musculoskeletal:  Negative for joint pain and myalgias.  Skin:  Positive for itching and rash.  Neurological:  Negative for dizziness, speech change, seizures, weakness and headaches.      Objective:     Resp 16    Physical Exam Vitals and nursing note reviewed.  Constitutional:      General: She is awake. She is not in acute distress.    Appearance: Normal appearance. She is well-developed and well-groomed. She is obese. She is not ill-appearing, toxic-appearing or diaphoretic.  HENT:     Head: Normocephalic and atraumatic.     Jaw: There is normal jaw occlusion.     Salivary Glands: Right salivary gland is not diffusely enlarged. Left salivary gland is not diffusely enlarged.     Right Ear: Hearing and external ear normal.     Left Ear: Hearing and external ear normal.     Nose: Nose normal. No congestion or rhinorrhea.     Mouth/Throat:     Lips: Pink. No lesions.     Mouth: Mucous membranes are moist. No oral lesions or angioedema.     Dentition: No gum lesions.     Tongue: No lesions. Tongue does  not deviate from midline.     Palate: No mass and lesions.     Pharynx: Oropharynx is clear. Uvula midline. No oropharyngeal exudate or uvula swelling.     Tonsils: No tonsillar exudate.  Eyes:     General: Lids are normal. Vision grossly intact. Gaze aligned appropriately. Allergic shiner present. No scleral icterus.       Right eye: No discharge.        Left eye: No discharge.     Extraocular Movements: Extraocular movements intact.     Conjunctiva/sclera: Conjunctivae normal.     Pupils: Pupils are equal, round, and reactive to light.  Neck:     Trachea: Trachea and phonation normal.  Cardiovascular:     Rate and Rhythm: Normal rate and regular rhythm.     Pulses: Normal pulses.          Radial pulses are 2+ on the right side and 2+ on the left side.  Pulmonary:     Effort: Pulmonary effort is normal. No respiratory distress.     Breath sounds: Normal breath sounds and air entry. No stridor or transmitted upper airway sounds. No wheezing.     Comments: Spoke full sentences without difficulty; no cough observed in exam room Abdominal:     General: Abdomen is flat.     Palpations: Abdomen is soft.  Musculoskeletal:  General: No swelling, tenderness or signs of injury. Normal range of motion.     Right elbow: No swelling, deformity, effusion or lacerations. Normal range of motion.     Left elbow: No swelling, deformity, effusion or lacerations. Normal range of motion.     Right forearm: No swelling, edema, deformity or lacerations.     Left forearm: No swelling, edema, deformity or lacerations.     Right hand: Normal strength. Normal capillary refill.     Left hand: Normal strength. Normal capillary refill.     Cervical back: Normal range of motion and neck supple. No edema, erythema, signs of trauma, rigidity, torticollis or crepitus. No pain with movement. Normal range of motion.     Right lower leg: No edema.     Left lower leg: No edema.  Lymphadenopathy:     Head:      Right side of head: No submandibular or preauricular adenopathy.     Left side of head: No submandibular or preauricular adenopathy.     Cervical: No cervical adenopathy.     Right cervical: No superficial cervical adenopathy.    Left cervical: No superficial cervical adenopathy.  Skin:    General: Skin is warm and dry.     Capillary Refill: Capillary refill takes less than 2 seconds.     Coloration: Skin is not ashen, cyanotic, jaundiced, mottled, pale or sallow.     Findings: Erythema and rash present. No abrasion, abscess, acne, bruising, burn, ecchymosis, signs of injury, laceration, lesion, petechiae or wound. Rash is macular and papular. Rash is not crusting, nodular, purpuric, pustular, scaling, urticarial or vesicular.     Nails: There is no clubbing.          Comments: Papules in linear pattern on erythematous macular base bilateral anterior forearms scattered covering less than 10% surface area bilateral forearms dry  Neurological:     General: No focal deficit present.     Mental Status: She is alert and oriented to person, place, and time. Mental status is at baseline.     GCS: GCS eye subscore is 4. GCS verbal subscore is 5. GCS motor subscore is 6.     Cranial Nerves: Cranial nerves 2-12 are intact. No cranial nerve deficit, dysarthria or facial asymmetry.     Motor: Motor function is intact. No weakness, tremor, atrophy, abnormal muscle tone or seizure activity.     Coordination: Coordination is intact. Coordination normal.     Gait: Gait is intact. Gait normal.     Comments: In/out of chair without difficulty; gait sure and steady in clinic; bilateral hand grasp equal 5/5  Psychiatric:        Attention and Perception: Attention and perception normal.        Mood and Affect: Mood and affect normal.        Speech: Speech normal.        Behavior: Behavior normal. Behavior is cooperative.        Thought Content: Thought content normal.        Cognition and Memory: Cognition  and memory normal.        Judgment: Judgment normal.      No results found for any visits on 03/08/24.    The 10-year ASCVD risk score (Arnett DK, et al., 2019) is: 3.7%    Assessment & Plan:   Problem List Items Addressed This Visit   None Visit Diagnoses       Irritant contact dermatitis due to plant    -  Primary      P-zyrtec 10mg  po BID prn itching. Prednisone  10mg  taper po daily with breakfast start tomorrow 20mg x4 days then 10mg x 4days then 5mg  x 4 days #21 RF0 dispensed from [facility] to patient May apply calagel OTC topical BID prn itching wash hands after application do not get in eyes; if worsening with calagel use stop and trial hydrocortisone 1% topical BID small smear affected area and apply emollient over the top eq vaseline/aquaphor.  Wash hands before and after application.  Avoid hot steam showers.  Apply emollient twice a day e.g. Fragrance free vaseline.  May apply ice/cold compress 5 minutes QID prn itching/swelling.  Shower when she finishes work/prior to bedtime.  Avoid harsh/abrasive soaps use fragrance free/sensitive like dove/cetaphil.    Medication as directed. Call or return to clinic as needed if these symptoms worsen or fail to improve as anticipated. Exitcare handout on poison ivy  Follow up for re-evaluation in 48 hours if no improvement and/or worsening of rash with plan of care. Patient verbalized agreement and understanding of treatment plan and had no further questions at this time  Return if symptoms worsen or fail to improve.    Richardine Chancy, NP

## 2024-03-13 ENCOUNTER — Telehealth: Payer: Self-pay | Admitting: Registered Nurse

## 2024-03-13 DIAGNOSIS — I10 Essential (primary) hypertension: Secondary | ICD-10-CM

## 2024-03-13 DIAGNOSIS — L247 Irritant contact dermatitis due to plants, except food: Secondary | ICD-10-CM

## 2024-03-13 MED ORDER — HYDROCHLOROTHIAZIDE 25 MG PO TABS: 25.0000 mg | ORAL_TABLET | Freq: Every day | ORAL | 3 refills | Status: AC

## 2024-03-13 NOTE — Telephone Encounter (Signed)
 Patient last BP with RN Thersia Flax 02/28/24 125/85  Last labs oct 2024 with PCM stable  Component Ref Range & Units 6 mo ago Comments  Sodium 136 - 145 mmol/L 139   Potassium 3.5 - 5.1 mmol/L 4 NO VISIBLE HEMOLYSIS  Chloride 98 - 107 mmol/L 107   CO2 21 - 31 mmol/L 25   Anion Gap 6 - 14 mmol/L 7   Glucose, Random 70 - 99 mg/dL 82   Blood Urea Nitrogen (BUN) 7 - 25 mg/dL 13   Creatinine 1.61 - 1.20 mg/dL 0.96   eGFR >04 VW/UJW/1.19J4 >90 GFR estimated by CKD-EPI equations(NKF 2021).  "Recommend confirmation of Cr-based eGFR by using Cys-based eGFR and other filtration markers (if applicable) in complex cases and clinical decision-making, as needed."  Albumin 3.5 - 5.7 g/dL 4.5   Total Protein 6.4 - 8.9 g/dL 7   Bilirubin, Total 0.3 - 1.0 mg/dL 1.1 High    Alkaline Phosphatase (ALP) 34 - 104 U/L 58   Aspartate Aminotransferase (AST) 13 - 39 U/L 22   Alanine Aminotransferase (ALT) 7 - 52 U/L 10   Calcium 8.6 - 10.3 mg/dL 78.2   BUN/Creatinine Ratio  Creatinine is normal, ratio is not clinically indicated.  Resulting Agency [facility](CLIA# [id number])   Specimen Collected: 09/15/23 13:14   Performed by: Rosalva Comber [facility](CLIA# [id number]) Last Resulted: 09/15/23 20:17  Received From: [facility]  Result Received: 11/29/23 12:25   next due labs in the fall 2025.  Started hydrochlorothiazide  12.5mg  po daily 01/31/24 and was increased to 25mg  on 02/15/24 and patient used up her remaining 12.5mg  tablets stated will run out this week.  Feeling well denied concerns.  Rash bilateral arm resolved with prednisone  taper denied further questions or concerns at this time.  Dispensed 90 tabs hydrochlorothiazide  25mg  po daily to patient today from [facility].

## 2024-03-29 ENCOUNTER — Encounter: Payer: Self-pay | Admitting: Registered Nurse

## 2024-03-29 ENCOUNTER — Ambulatory Visit: Admitting: Registered Nurse

## 2024-03-29 VITALS — BP 130/86 | HR 95

## 2024-03-29 DIAGNOSIS — W010XXA Fall on same level from slipping, tripping and stumbling without subsequent striking against object, initial encounter: Secondary | ICD-10-CM

## 2024-03-29 DIAGNOSIS — L03115 Cellulitis of right lower limb: Secondary | ICD-10-CM

## 2024-03-29 DIAGNOSIS — S80211A Abrasion, right knee, initial encounter: Secondary | ICD-10-CM

## 2024-03-29 DIAGNOSIS — S8001XA Contusion of right knee, initial encounter: Secondary | ICD-10-CM

## 2024-03-29 MED ORDER — CEPHALEXIN 500 MG PO CAPS: 500.0000 mg | ORAL_CAPSULE | Freq: Two times a day (BID) | ORAL | Status: DC

## 2024-03-29 MED ORDER — TRIPLE ANTIBIOTIC 3.5-400-5000 EX OINT: 1.0000 | TOPICAL_OINTMENT | Freq: Every day | CUTANEOUS | Status: AC

## 2024-03-29 NOTE — Patient Instructions (Signed)
 Contusion A contusion is a deep bruise. Contusions are the result of a blunt injury to tissues and muscle fibers under the skin. The injury causes bleeding under the skin. The skin over the contusion may turn blue, purple, or yellow. Minor injuries will give you a painless contusion, but more severe injuries cause contusions that can stay painful and swollen for a few weeks. Follow these instructions at home: Pay attention to any changes in your symptoms. Let your health care provider know about them. Take these actions to relieve your pain. Managing pain, stiffness, and swelling  Use resting, icing, applying pressure (compression), and raising (elevating) the injured area. This is often called the RICE method. Rest the injured area. Return to your normal activities as told by your health care provider. Ask your health care provider what activities are safe for you. If directed, put ice on the injured area. To do this: Put ice in a plastic bag. Place a towel between your skin and the bag. Leave the ice on for 20 minutes, 2-3 times a day. If your skin turns bright red, remove the ice right away to prevent skin damage. The risk of skin damage is higher if you cannot feel pain, heat, or cold. If directed, apply light compression to the injured area using an elastic bandage. Make sure the bandage is not wrapped too tightly. Remove and reapply the bandage as directed by your health care provider. If possible, elevate the injured area above the level of your heart while you are sitting or lying down. General instructions Take over-the-counter and prescription medicines only as told by your health care provider. Keep all follow-up visits. Your health care provider may want to see how your contusion is healing with treatment. Contact a health care provider if: Your symptoms do not improve after several days of treatment. Your symptoms get worse. You have difficulty moving the injured area. Get help  right away if: You have severe pain. You have numbness in a hand or foot. Your hand or foot turns pale or cold. This information is not intended to replace advice given to you by your health care provider. Make sure you discuss any questions you have with your health care provider. Document Revised: 04/19/2022 Document Reviewed: 04/19/2022 Elsevier Patient Education  2024 [employer]Abrasion An abrasion is a cut or scrape that affects only the surface of the skin. The injury doesn't go through all the layers of the skin. Still, an abrasion can cause pain because it leaves the skin and its nerves open. Abrasions are usually minor injuries that can be treated at home. You must care for your abrasion to prevent infection. What are the causes? Abrasions happen when something rubs, scrapes, or scratches your skin. This can happen when you fall on a hard or rough surface. Or, when a bush in your yard scratches you. When your skin rubs against something, some layers of skin may come off. You may also see tiny tears on your skin. What are the signs or symptoms? The main symptom of this condition is a cut or a scrape. The cut or scrape may bleed. It may also appear red or pink. If your abrasion is caused by a fall, there may be a bruise under your cut or scrape. How is this diagnosed? An abrasion is diagnosed with a physical exam. How is this treated? Treatment for this condition depends on how large and deep the abrasion is. In most cases: Your abrasion will be cleaned with water   and mild soap. An antibiotic may be put on the wound to prevent infection. A petroleum jelly may be put on the wound to prevent moisture. A bandage may be placed on your abrasion to keep it clean. You may need a tetanus shot. Follow these instructions at home: Medicines Take over-the-counter and prescription medicines only as told by your health care provider. If you were prescribed antibiotics, use them as told by your  provider. Do not stop using them even if you start to feel better. Wound care Clean your wound 1-2 times a day, or as told by your provider. Wash your hands for at least 20 seconds with mild soap and water . Do this before and after you clean your wound. If soap and water  are not available, use hand sanitizer to clean your hands. Wash your wound using mild soap and water , a wound cleanser, or a saltwater solution. A saltwater solution is also called saline. Do not use hydrogen peroxide or alcohol. These can slow healing. Rinse off the soap. Pat your wound dry with a clean towel. Do not rub your wound. Put an antibiotic ointment on your wound as told by your provider. You may also be told to put on a jelly that prevents moisture from entering the wound. Cover your wound with a bandage as told by your provider. Small or very minor abrasions may not need a bandage. Keep your bandage clean and dry as told by your provider. There are many ways to close and cover a wound. Follow instructions from your provider about changing and removing your bandage. You may have to change your bandage one or more times a day, or as told by your provider. Check your wound every day for signs of infection. Check for: Redness. Watch for a red streak that spreads out from your wound. Swelling or worse pain. Warmth. Blood, fluid, pus, or a bad smell. Managing pain and swelling  If told, put ice on the injured area. Put ice in a plastic bag. Place a towel between your skin and the bag. Leave the ice on for 20 minutes, 2-3 times a day. If your skin turns bright red, remove the ice right away to prevent skin damage. The risk of damage is higher if you cannot feel pain, heat, or cold. If possible, raise the injured area above the level of your heart while you are sitting or lying down. General instructions Do not take baths, swim, or use a hot tub until your provider approves. Ask your provider if you may take showers.  You may only be allowed to take sponge baths. Do not scratch or pick at scabs that may occur over the wound as it heals. Contact a health care provider if: You got a tetanus shot, and you have swelling, severe pain, redness, or bleeding at the site of your shot. Your pain is worse and medicines do not help. You have a fever. You have any of signs of infection. Get help right away if: You have a red streak spreading away from your wound. This information is not intended to replace advice given to you by your health care provider. Make sure you discuss any questions you have with your health care provider. Document Revised: 01/07/2023 Document Reviewed: 01/07/2023 Elsevier Patient Education  2024 [employer]Cellulitis, Adult  Cellulitis is a skin infection. The infected area is usually warm, red, swollen, and tender. It most commonly occurs on the lower body, such as the legs, feet, and toes, but this condition  can occur on any part of the body. The infection can travel to the muscles, blood, and underlying tissue and become life-threatening without treatment. It is important to get medical treatment right away for this condition. What are the causes? Cellulitis is caused by bacteria. The bacteria enter through a break in the skin, such as a cut, burn, insect or animal bite, open sore, or crack. What increases the risk? This condition is more likely to occur in people who: Have a weak body's defense system (immune system). Are older than 60 years old. Have diabetes. Have a type of long-term (chronic) liver disease (cirrhosis) or kidney disease. Are obese. Have a skin condition such as: An itchy rash, such as eczema or psoriasis. A fungal rash on the feet or in skinfolds. Blistering rashes, such as shingles or chickenpox. Slow movement of blood in the veins (venous stasis). Fluid buildup below the skin (edema). Have open wounds on the skin, such as cuts, puncture wounds, burns, bites,  scrapes, tattoos, piercings, or wounds from surgery. Have had radiation therapy. Use IV drugs. What are the signs or symptoms? Symptoms of this condition include: Skin that looks red, purple, or slightly darker than your usual skin color. Streaks or spots on the skin. Swollen area of the skin. Tenderness or pain when an area of the skin is touched. Warm skin. Fever or chills. Blisters. Tiredness (fatigue). How is this diagnosed? This condition is diagnosed based on a medical history and physical exam. You may also have tests, including: Blood tests. Imaging tests. Tests on a sample of fluid taken from the wound (wound culture). How is this treated? Treatment for this condition may include: Medicines. These may include antibiotics or medicines to treat allergies (antihistamines). Rest. Applying cold or warm wet cloths (compresses) to the skin. If the condition is severe, you may need to stay in the hospital and get antibiotics through an IV. The infection usually starts to get better within 1-2 days of treatment. Follow these instructions at home: Medicines Take over-the-counter and prescription medicines only as told by your health care provider. If you were prescribed antibiotics, take them as told by your provider. Do not stop using the antibiotic even if you start to feel better. General instructions Drink enough fluid to keep your pee (urine) pale yellow. Do not touch or rub the infected area. Raise (elevate) the infected area above the level of your heart while you are sitting or lying down. Return to your normal activities as told by your provider. Ask your provider what activities are safe for you. Apply warm or cold compresses to the affected area as told by your provider. Keep all follow-up visits. Your provider will need to make sure that a more serious infection is not developing. Contact a health care provider if: You have a fever. Your symptoms do not improve within  1-2 days of starting treatment or you develop new symptoms. Your bone or joint underneath the infected area becomes painful after the skin has healed. Your infection returns in the same area or another area. Signs of this may include: You notice a swollen bump in the infected area. Your red area gets larger, turns dark in color, or becomes more painful. Drainage increases. Pus or a bad smell develops in your infected area. You have more pain. You feel ill and have muscle aches and weakness. You develop vomiting or diarrhea that will not go away. Get help right away if: You notice red streaks coming from the infected  area. You notice the skin turns purple or black and falls off. This symptom may be an emergency. Get help right away. Call 911. Do not wait to see if the symptom will go away. Do not drive yourself to the hospital. This information is not intended to replace advice given to you by your health care provider. Make sure you discuss any questions you have with your health care provider. Document Revised: 06/29/2022 Document Reviewed: 06/29/2022 Elsevier Patient Education  2024 [employer].

## 2024-03-29 NOTE — Progress Notes (Signed)
 Established Patient Office Visit  Subjective   Patient ID: Sara Cook, female    DOB: 10-29-1966  Age: 58 y.o. MRN: [id number]  No chief complaint on file.   58y/o caucasian established female walking outside at home and fell slip/trip Tuesday. Skin ripped off right knee she washed with soap and water  and applied neosporin and bandage.  Next day had purulent discharge cleansed with hydrogen peroxide pain worsening using ibuprofen  po prn.  Here for evaluation today as still having pain especially with bending knee.  Denied fever/chills/weakness and purulent discharge resolved  Wound scabbed some redness around it.     Review of Systems  Constitutional:  Negative for chills and fever.  Eyes:  Negative for pain, discharge and redness.  Respiratory:  Negative for cough, hemoptysis, shortness of breath, wheezing and stridor.   Cardiovascular:  Negative for chest pain and leg swelling.  Gastrointestinal:  Negative for heartburn and vomiting.  Musculoskeletal:  Positive for falls, joint pain and myalgias. Negative for back pain and neck pain.  Skin:  Positive for rash.  Neurological:  Negative for dizziness, tingling, tremors, weakness and headaches.  Psychiatric/Behavioral:  The patient does not have insomnia.       Objective:     BP 130/86 (BP Location: Left Arm, Patient Position: Sitting)   Pulse 95   SpO2 97%    Physical Exam Vitals and nursing note reviewed.  Constitutional:      General: She is awake. She is not in acute distress.    Appearance: Normal appearance. She is well-developed and well-groomed. She is obese. She is not ill-appearing, toxic-appearing or diaphoretic.  HENT:     Head: Normocephalic and atraumatic.     Jaw: There is normal jaw occlusion.     Salivary Glands: Right salivary gland is not diffusely enlarged. Left salivary gland is not diffusely enlarged.     Right Ear: Hearing and external ear normal. No decreased hearing noted.     Left Ear:  Hearing and external ear normal. No decreased hearing noted.     Nose: Nose normal. No congestion or rhinorrhea.     Mouth/Throat:     Lips: Pink. No lesions.     Mouth: Mucous membranes are moist. No oral lesions or angioedema.     Dentition: No gum lesions.     Tongue: No lesions.     Pharynx: Oropharynx is clear. Uvula midline.  Eyes:     General: Lids are normal. Vision grossly intact. Gaze aligned appropriately. Allergic shiner present. No scleral icterus.       Right eye: No discharge.        Left eye: No discharge.     Extraocular Movements: Extraocular movements intact.     Conjunctiva/sclera: Conjunctivae normal.     Pupils: Pupils are equal, round, and reactive to light.  Neck:     Trachea: Trachea normal.  Cardiovascular:     Rate and Rhythm: Normal rate and regular rhythm.     Pulses: Normal pulses.          Radial pulses are 2+ on the right side and 2+ on the left side.  Pulmonary:     Effort: Pulmonary effort is normal. No respiratory distress.     Breath sounds: Normal breath sounds and air entry. No stridor or transmitted upper airway sounds. No wheezing, rhonchi or rales.     Comments: Spoke full sentences without difficulty; no cough observed in exam room Abdominal:     General: Abdomen is  flat.     Palpations: Abdomen is soft.  Musculoskeletal:        General: Swelling, tenderness and signs of injury present.     Right hand: Normal strength. Normal capillary refill.     Left hand: Normal strength. Normal capillary refill.     Cervical back: Normal range of motion and neck supple. No rigidity.     Right knee: Swelling and erythema present. No deformity, effusion, ecchymosis, lacerations or crepitus. Decreased range of motion. Tenderness present over the patellar tendon. No medial joint line, lateral joint line, MCL, LCL, ACL or PCL tenderness. No LCL laxity, MCL laxity or ACL laxity. Normal alignment.     Left knee: No swelling, deformity, effusion, erythema,  ecchymosis, lacerations or crepitus. Normal range of motion. No tenderness. Normal alignment.     Right lower leg: No edema.     Left lower leg: No edema.       Legs:     Comments: 2cm diameter abrasion scabbed anterior right knee brown dry distal to patella; erythema 1cm surrounding scab mildly TTP and localized edema 0-1+/4 nonpitting; slow AROM and right decreased compared to left flexion; extension equal bilateral knees  Lymphadenopathy:     Head:     Right side of head: No submandibular or preauricular adenopathy.     Left side of head: No submandibular or preauricular adenopathy.     Cervical: No cervical adenopathy.     Right cervical: No superficial cervical adenopathy.    Left cervical: No superficial cervical adenopathy.  Skin:    General: Skin is warm and dry.     Capillary Refill: Capillary refill takes less than 2 seconds.     Coloration: Skin is not ashen, cyanotic, jaundiced, mottled, pale or sallow.     Findings: Abrasion, bruising, erythema and rash present. No abscess, acne, burn, signs of injury, laceration, petechiae or wound. Rash is macular, papular and scaling. Rash is not crusting, nodular, purpuric, pustular, urticarial or vesicular.     Nails: There is no clubbing.          Comments: See MSK  Neurological:     General: No focal deficit present.     Mental Status: She is alert and oriented to person, place, and time. Mental status is at baseline.     GCS: GCS eye subscore is 4. GCS verbal subscore is 5. GCS motor subscore is 6.     Cranial Nerves: No cranial nerve deficit, dysarthria or facial asymmetry.     Sensory: Sensation is intact.     Motor: Motor function is intact. No weakness, tremor, atrophy, abnormal muscle tone or seizure activity.     Coordination: Coordination is intact. Coordination normal.     Gait: Gait is intact. Gait normal.     Comments: In/out of chair and on/off exam table without difficulty; gait sure and steady in clinic; bilateral hand  grasp equal 5/5  Psychiatric:        Attention and Perception: Attention and perception normal.        Mood and Affect: Mood and affect normal.        Speech: Speech normal.        Behavior: Behavior normal. Behavior is cooperative.        Thought Content: Thought content normal.        Cognition and Memory: Cognition and memory normal.        Judgment: Judgment normal.     No results found for any visits on  03/29/24.    The 10-year ASCVD risk score (Arnett DK, et al., 2019) is: 4.5%    Assessment & Plan:   Problem List Items Addressed This Visit   None Visit Diagnoses       Abrasion, right knee, initial encounter    -  Primary     Cellulitis of right knee         Fall on same level from slipping, tripping or stumbling, initial encounter         Contusion of right knee, initial encounter          Exitcare handouts on abrasion, contusion, and skin infection. RTC if worsening erythema, pain, purulent discharge, fever. Wash towels, washcloths, sheets in hot water  with bleach every couple of days until infection resolved. Wash area with soap and water  at least daily.  Apply triple antibiotic topical daily with dressing change.  Cover with bandage at work until healed over.  Given bandaids from clinic stock. Has neosporin at home.  Cephalexin  500mg  po BID x 7 days #30 RF0 dispensed from [facility] to patient  May apply ice 15 minutes QID prn pain/swelling.  Elevate leg when sitting on break or at home.  Patient verbalized understanding, agreed with plan of care and had no further questions at this time.     Do not soak right knee in dirty water  until abrasions healed avoid pool, lake, hot tub, dirty sink water . May shower apply neosporin BID prn keep wounds covered they will heal faster and prevent contamination rubbing from clothing tearing off scabs.    Change prn soiling. Exitcare handout on abrasion tylenol  1000mg  po q6h prn pain or ibuprofen  800mg  po q8h with food prn pain. Medications as  directed. Call or return to clinic as needed if these symptoms worsen or fail to improve as anticipated. Patient verbalized agreement and understanding of treatment plan. P2: ROM, injury prevention   No follow-ups on file.    Richardine Chancy, NP

## 2024-03-29 NOTE — Progress Notes (Signed)
 Np to assess injury to right knee

## 2024-04-03 ENCOUNTER — Ambulatory Visit: Admitting: Registered Nurse

## 2024-04-03 VITALS — BP 129/88 | HR 99 | Temp 98.2°F

## 2024-04-03 DIAGNOSIS — L03115 Cellulitis of right lower limb: Secondary | ICD-10-CM

## 2024-04-03 MED ORDER — SALINE SPRAY 0.65 % NA SOLN: 2.0000 | NASAL | Status: AC

## 2024-04-03 MED ORDER — AMOXICILLIN-POT CLAVULANATE 875-125 MG PO TABS: 1.0000 | ORAL_TABLET | Freq: Two times a day (BID) | ORAL | 0 refills | Status: AC

## 2024-04-03 NOTE — Progress Notes (Signed)
 C/O severe pain to right knee redness noted around the open area

## 2024-04-03 NOTE — Progress Notes (Signed)
 Established Patient Office Visit  Subjective   Patient ID: Sara Cook, female    DOB: 1966/11/03  Age: 58 y.o. MRN: [id number]  Chief Complaint  Patient presents with   Pain    Right knee and swelling/rash/discharge    58y/o cacuasian female last OV 03/29/24 on keflex  500mg  po BID still having yellow cloudy in am discharge, worsening pain and swelling medial knee using peroxide hydrogen and antiseptic spray not improving here for re-evaluation.  Has been covering with bandaid at work and removing when she gets home at night     Review of Systems  Constitutional:  Negative for chills and fever.  Respiratory:  Negative for shortness of breath and wheezing.   Cardiovascular:  Negative for chest pain.  Gastrointestinal:  Negative for diarrhea and vomiting.  Musculoskeletal:  Positive for joint pain.  Neurological:  Negative for dizziness, tingling, tremors, sensory change, weakness and headaches.  Psychiatric/Behavioral:  The patient does not have insomnia.       Objective:     BP 129/88 (BP Location: Left Arm, Patient Position: Sitting, Cuff Size: Normal)   Pulse 99   Temp 98.2 F (36.8 C) (Tympanic)   SpO2 97%    Physical Exam Vitals and nursing note reviewed.  Constitutional:      General: She is awake. She is not in acute distress.    Appearance: Normal appearance. She is well-developed and well-groomed. She is obese. She is not ill-appearing, toxic-appearing or diaphoretic.  HENT:     Head: Normocephalic and atraumatic.     Jaw: There is normal jaw occlusion.     Salivary Glands: Right salivary gland is not diffusely enlarged. Left salivary gland is not diffusely enlarged.     Right Ear: Hearing and external ear normal. No decreased hearing noted.     Left Ear: Hearing and external ear normal. No decreased hearing noted.     Nose: Nose normal. No congestion or rhinorrhea.     Mouth/Throat:     Lips: Pink. No lesions.     Mouth: Mucous membranes are moist. No  oral lesions or angioedema.     Dentition: No gum lesions.     Pharynx: Oropharynx is clear.  Eyes:     General: Lids are normal. Vision grossly intact. Gaze aligned appropriately. Allergic shiner present. No scleral icterus.       Right eye: No discharge.        Left eye: No discharge.     Extraocular Movements: Extraocular movements intact.     Conjunctiva/sclera: Conjunctivae normal.     Pupils: Pupils are equal, round, and reactive to light.  Neck:     Trachea: Trachea normal.  Cardiovascular:     Rate and Rhythm: Normal rate and regular rhythm.     Pulses: Normal pulses.          Radial pulses are 2+ on the right side and 2+ on the left side.  Pulmonary:     Effort: Pulmonary effort is normal. No respiratory distress.     Breath sounds: Normal breath sounds and air entry. No stridor or transmitted upper airway sounds. No wheezing.     Comments: Spoke full sentences without difficulty; no cough observed in exam room Abdominal:     General: Abdomen is flat.  Musculoskeletal:        General: Swelling, tenderness and signs of injury present.     Right hand: Normal strength. Normal capillary refill.     Left hand: Normal strength. Normal  capillary refill.     Cervical back: Normal range of motion and neck supple. No swelling, edema, deformity, erythema, signs of trauma, lacerations, rigidity, spasms, torticollis, tenderness or crepitus. No pain with movement. Normal range of motion.     Thoracic back: No swelling, edema, deformity, signs of trauma, lacerations, spasms or tenderness. Normal range of motion.     Right knee: Swelling, erythema and laceration present. No deformity, ecchymosis, bony tenderness or crepitus. Decreased range of motion. Tenderness present. No medial joint line, lateral joint line or patellar tendon tenderness.     Left knee: No swelling, deformity, effusion, erythema, ecchymosis, lacerations, bony tenderness or crepitus. Normal range of motion. No tenderness. No  patellar tendon tenderness.     Right lower leg: No edema.     Left lower leg: Edema present.       Legs:     Comments: Prepatellar bursa TTP edema localized 0-1+/4 anterior right knee; scab intact anterior knee distal to patella dry brown scab and macular erythema/abrasion surrounding scab; mildly palpable increased temperature  compared to left  Lymphadenopathy:     Head:     Right side of head: No submandibular or preauricular adenopathy.     Left side of head: No submandibular or preauricular adenopathy.     Cervical: No cervical adenopathy.     Right cervical: No superficial, deep or posterior cervical adenopathy.    Left cervical: No superficial, deep or posterior cervical adenopathy.  Skin:    General: Skin is warm and dry.     Capillary Refill: Capillary refill takes less than 2 seconds.     Coloration: Skin is not ashen, cyanotic, jaundiced, mottled, pale or sallow.     Findings: Abrasion, erythema and rash present. No acne, bruising, burn, ecchymosis, signs of injury, laceration, petechiae or wound. Rash is macular and papular. Rash is not crusting, nodular, purpuric, pustular, scaling, urticarial or vesicular.     Nails: There is no clubbing.          Comments: Right lower leg distal to patella 2cm brown scab intact centrally 0-1+/4 nonpitting edema superior to abrasion/scab mildly TTP no erythematous streaking visible or palpable temperature streak  Neurological:     General: No focal deficit present.     Mental Status: She is alert and oriented to person, place, and time. Mental status is at baseline.     GCS: GCS eye subscore is 4. GCS verbal subscore is 5. GCS motor subscore is 6.     Cranial Nerves: No cranial nerve deficit, dysarthria or facial asymmetry.     Motor: Motor function is intact. No weakness, tremor, atrophy, abnormal muscle tone or seizure activity.     Coordination: Coordination is intact. Coordination normal.     Gait: Gait is intact. Gait normal.      Comments: In/out of chair and on/off exam table without difficulty; gait sure and steady in clinic; bilateral hand grasp equal 5/5  Psychiatric:        Attention and Perception: Attention and perception normal.        Mood and Affect: Mood and affect normal.        Speech: Speech normal.        Behavior: Behavior normal. Behavior is cooperative.        Thought Content: Thought content normal.        Cognition and Memory: Cognition and memory normal.        Judgment: Judgment normal.      No results  found for any visits on 04/03/24.    The 10-year ASCVD risk score (Arnett DK, et al., 2019) is: 4.4%    Assessment & Plan:   Problem List Items Addressed This Visit   None Visit Diagnoses       Cellulitis of right knee    -  Primary      Will cover for MRSA start augmentin  875mg  po BID x 10 days #20 RF0 dispensed from [facility] to patient.  Stop keflex .  RTC if worsening erythema, pain, purulent discharge, fever. Wash towels, washcloths, sheets in hot water  with bleach every couple of days until infection resolved. Wash area with soap and water  at least daily.  Apply triple antibiotic topical daily with dressing change.  Cover with bandage at work until healed over. Has neosporin at home.    May apply ice 15 minutes QID prn pain/swelling.  Elevate leg when sitting on break or at home.  Patient verbalized understanding, agreed with plan of care and had no further questions at this time.      Do not soak right knee in dirty water  until abrasions healed avoid pool, lake, hot tub, dirty sink water . May shower apply neosporin BID prn keep wounds covered they will heal faster and prevent contamination rubbing from clothing tearing off scabs.    Change prn soiling. [employer] handout on abrasion tylenol  1000mg  po q6h prn pain or ibuprofen  800mg  po q8h with food prn pain. Medications as directed. Call or return to clinic as needed if these symptoms worsen or fail to improve as anticipated. Patient  verbalized agreement and understanding of treatment plan. P2: ROM, injury prevention   No follow-ups on file.   No follow-ups on file.    Richardine Chancy, NP

## 2024-04-03 NOTE — Patient Instructions (Signed)
MRSA Infection, Diagnosis, Adult Methicillin-resistant Staphylococcus aureus (MRSA) infection is caused by bacteria called Staphylococcus aureus, or staph, that no longer respond to common antibiotic medicines (drug-resistant bacteria). MRSA infection can be hard to treat. Most of the time, MRSA can be on the skin or in the nose without causing problems (colonized). However, if MRSA enters the body through a cut, a sore, or an invasive medical device, it can cause a serious infection. What are the causes? This condition is caused by staph bacteria. Illness may develop after exposure to the bacteria through: Skin-to-skin contact with someone who is infected with MRSA. Touching surfaces that have the bacteria on them. Having a procedure or using equipment that allows MRSA to enter the body. Having MRSA that lives on your skin and then enters your body through: A cut or scratch. A surgery or procedure. The use of a medical device. Contact with the bacteria may occur: During a stay in a hospital, rehabilitation facility, nursing home, or other health care facility (health care-associated MRSA). In daily activities where there is close contact with others, such as sports, child care centers, or at home (community-associated MRSA). What increases the risk? You are more likely to develop this condition if you: Have a surgery or procedure. Have an IV or a thin tube (catheter) placed in your body. Are elderly. Are on kidney dialysis. Have recently taken an antibiotic medicine. Live in a long-term care facility. Have a chronic wound or skin ulcer. Have a weak body defense system (immune system). Play sports that involve skin-to-skin contact. Live in a crowded place, like a dormitory or [employer]. Share towels, razors, or sports equipment with other people. Have a history of MRSA infection or colonization. What are the signs or symptoms? Symptoms of this condition depend on the area that  is affected. Symptoms may include: A pus-filled pimple or boil. Pus that drains from your skin. A sore (abscess) under your skin or somewhere in your body. Fever with or without chills. Difficulty breathing. Coughing up blood. Redness, warmth, swelling, or pain in the affected area. How is this diagnosed? This condition may be diagnosed based on: A physical exam. Your medical history. Taking a sample from the infected area and growing it in a lab (culture). You may also have other tests, including: Imaging tests, such as X-rays, a CT scan, or an MRI. Lab tests, such as blood, urine, or phlegm (sputum) tests. You skin or nose may be swabbed when you are admitted to a health care facility for a procedure. This is to screen for MRSA. How is this treated? Treatment depends on the type of MRSA infection you have and how severe, deep, or extensive it is. Treatment may include: Antibiotic medicines. Surgery to drain pus from the infected area. Severe infections may require a hospital stay. Follow these instructions at home: Medicines Take over-the-counter and prescription medicines only as told by your health care provider. If you were prescribed an antibiotic medicine, use it as told by your health care provider. Do not stop using the antibiotic even if you start to feel better. Prevention Follow these instructions to avoid spreading the infection to others: Wash your hands frequently with soap and water. If soap and water are not available, use an alcohol-based hand sanitizer. Avoid close contact with those around you as much as possible. Do not use towels, razors, toothbrushes, bedding, or other items that will be used by others. Wash towels, bedding, and clothes in the washing machine with detergent  and hot water. Dry them in a hot dryer. Clean surfaces regularly to remove germs (disinfection). Use products or solutions that contain bleach. Make sure you disinfect bathroom surfaces, food  preparation areas, exercise equipment, and doorknobs.  General instructions If you have a wound, follow instructions from your health care provider about how to take care of your wound. Do not pick at scabs. Do not try to drain any infection sites or pimples. Tell all your health care providers that you have MRSA, or if you have ever had a MRSA infection. Keep all follow-up visits as told by your health care provider. This is important. Contact a health care provider if you: Do not get better. Have symptoms that get worse. Have new symptoms. Get help right away if you have: Nausea or vomiting, or if you cannot take medicine without vomiting. Trouble breathing. Chest pain. These symptoms may represent a serious problem that is an emergency. Do not wait to see if the symptoms will go away. Get medical help right away. Call your local emergency services (911 in the [place] Do not drive yourself to the hospital. Summary MRSA infection is caused by bacteria called Staphylococcus aureus, or staph, that no longer respond to common antibiotic medicines. Treatment for this condition depends on the type of MRSA infection you have and how severe, deep, and extensive it is. If you were prescribed an antibiotic medicine, use it as told by your health care provider. Do not stop using the antibiotic even if you start to feel better. Follow instructions from your health care provider to avoid spreading the infection to others. This information is not intended to replace advice given to you by your health care provider. Make sure you discuss any questions you have with your health care provider. Document Revised: 09/16/2022 Document Reviewed: 09/16/2022 Elsevier Patient Education  2024 [employer].

## 2024-04-05 ENCOUNTER — Ambulatory Visit: Admitting: Registered Nurse

## 2024-04-05 ENCOUNTER — Encounter: Payer: Self-pay | Admitting: Registered Nurse

## 2024-04-05 VITALS — BP 145/94 | HR 102 | Temp 98.6°F

## 2024-04-05 DIAGNOSIS — L03115 Cellulitis of right lower limb: Secondary | ICD-10-CM

## 2024-04-05 DIAGNOSIS — I10 Essential (primary) hypertension: Secondary | ICD-10-CM

## 2024-04-05 NOTE — Patient Instructions (Signed)
 Managing Your Hypertension Hypertension, also called high blood pressure, is when the force of the blood pressing against the walls of the arteries is too strong. Arteries are blood vessels that carry blood from your heart throughout your body. Hypertension forces the heart to work harder to pump blood and may cause the arteries to become narrow or stiff. Understanding blood pressure readings A blood pressure reading includes a higher number over a lower number: The first, or top, number is called the systolic pressure. It is a measure of the pressure in your arteries as your heart beats. The second, or bottom number, is called the diastolic pressure. It is a measure of the pressure in your arteries as the heart relaxes. For most people, a normal blood pressure is below 120/80. Your personal target blood pressure may vary depending on your medical conditions, your age, and other factors. Blood pressure is classified into four stages. Based on your blood pressure reading, your health care provider may use the following stages to determine what type of treatment you need, if any. Systolic pressure and diastolic pressure are measured in a unit called millimeters of mercury (mmHg). Normal Systolic pressure: below 120. Diastolic pressure: below 80. Elevated Systolic pressure: 120-129. Diastolic pressure: below 80. Hypertension stage 1 Systolic pressure: 130-139. Diastolic pressure: 80-89. Hypertension stage 2 Systolic pressure: 140 or above. Diastolic pressure: 90 or above. How can this condition affect me? Managing your hypertension is very important. Over time, hypertension can damage the arteries and decrease blood flow to parts of the body, including the brain, heart, and kidneys. Having untreated or uncontrolled hypertension can lead to: A heart attack. A stroke. A weakened blood vessel (aneurysm). Heart failure. Kidney damage. Eye damage. Memory and concentration problems. Vascular  dementia. What actions can I take to manage this condition? Hypertension can be managed by making lifestyle changes and possibly by taking medicines. Your health care provider will help you make a plan to bring your blood pressure within a normal range. You may be referred for counseling on a healthy diet and physical activity. Nutrition  Eat a diet that is high in fiber and potassium, and low in salt (sodium), added sugar, and fat. An example eating plan is called the DASH diet. DASH stands for Dietary Approaches to Stop Hypertension. To eat this way: Eat plenty of fresh fruits and vegetables. Try to fill one-half of your plate at each meal with fruits and vegetables. Eat whole grains, such as whole-wheat pasta, brown rice, or whole-grain bread. Fill about one-fourth of your plate with whole grains. Eat low-fat dairy products. Avoid fatty cuts of meat, processed or cured meats, and poultry with skin. Fill about one-fourth of your plate with lean proteins such as fish, chicken without skin, beans, eggs, and tofu. Avoid pre-made and processed foods. These tend to be higher in sodium, added sugar, and fat. Reduce your daily sodium intake. Many people with hypertension should eat less than 1,500 mg of sodium a day. Lifestyle  Work with your health care provider to maintain a healthy body weight or to lose weight. Ask what an ideal weight is for you. Get at least 30 minutes of exercise that causes your heart to beat faster (aerobic exercise) most days of the week. Activities may include walking, swimming, or biking. Include exercise to strengthen your muscles (resistance exercise), such as weight lifting, as part of your weekly exercise routine. Try to do these types of exercises for 30 minutes at least 3 days a week. Do  not use any products that contain nicotine or tobacco. These products include cigarettes, chewing tobacco, and vaping devices, such as e-cigarettes. If you need help quitting, ask your  health care provider. Control any long-term (chronic) conditions you have, such as high cholesterol or diabetes. Identify your sources of stress and find ways to manage stress. This may include meditation, deep breathing, or making time for fun activities. Alcohol use Do not drink alcohol if: Your health care provider tells you not to drink. You are pregnant, may be pregnant, or are planning to become pregnant. If you drink alcohol: Limit how much you have to: 0-1 drink a day for women. 0-2 drinks a day for men. Know how much alcohol is in your drink. In the [place] one drink equals one 12 oz bottle of beer (355 mL), one 5 oz glass of wine (148 mL), or one 1 oz glass of hard liquor (44 mL). Medicines Your health care provider may prescribe medicine if lifestyle changes are not enough to get your blood pressure under control and if: Your systolic blood pressure is 130 or higher. Your diastolic blood pressure is 80 or higher. Take medicines only as told by your health care provider. Follow the directions carefully. Blood pressure medicines must be taken as told by your health care provider. The medicine does not work as well when you skip doses. Skipping doses also puts you at risk for problems. Monitoring Before you monitor your blood pressure: Do not smoke, drink caffeinated beverages, or exercise within 30 minutes before taking a measurement. Use the bathroom and empty your bladder (urinate). Sit quietly for at least 5 minutes before taking measurements. Monitor your blood pressure at home as told by your health care provider. To do this: Sit with your back straight and supported. Place your feet flat on the floor. Do not cross your legs. Support your arm on a flat surface, such as a table. Make sure your upper arm is at heart level. Each time you measure, take two or three readings one minute apart and record the results. You may also need to have your blood pressure checked regularly by  your health care provider. General information Talk with your health care provider about your diet, exercise habits, and other lifestyle factors that may be contributing to hypertension. Review all the medicines you take with your health care provider because there may be side effects or interactions. Keep all follow-up visits. Your health care provider can help you create and adjust your plan for managing your high blood pressure. Where to find more information National Heart, Lung, and Blood Institute: [employer] American Heart Association: www.heart.org Contact a health care provider if: You think you are having a reaction to medicines you have taken. You have repeated (recurrent) headaches. You feel dizzy. You have swelling in your ankles. You have trouble with your vision. Get help right away if: You develop a severe headache or confusion. You have unusual weakness or numbness, or you feel faint. You have severe pain in your chest or abdomen. You vomit repeatedly. You have trouble breathing. These symptoms may be an emergency. Get help right away. Call 911. Do not wait to see if the symptoms will go away. Do not drive yourself to the hospital. Summary Hypertension is when the force of blood pumping through your arteries is too strong. If this condition is not controlled, it may put you at risk for serious complications. Your personal target blood pressure may vary depending on your medical conditions,  your age, and other factors. For most people, a normal blood pressure is less than 120/80. Hypertension is managed by lifestyle changes, medicines, or both. Lifestyle changes to help manage hypertension include losing weight, eating a healthy, low-sodium diet, exercising more, stopping smoking, and limiting alcohol. This information is not intended to replace advice given to you by your health care provider. Make sure you discuss any questions you have with your health care  provider. Document Revised: 07/16/2021 Document Reviewed: 07/16/2021 Elsevier Patient Education  2024 [employer].

## 2024-04-05 NOTE — Progress Notes (Signed)
 F/U for right knee

## 2024-04-05 NOTE — Progress Notes (Signed)
 Established Patient Office Visit  Subjective   Patient ID: Sara Cook, female    DOB: Aug 21, 1966  Age: 58 y.o. MRN: [id number]  Chief Complaint  Patient presents with   Rash    Re-evaluation abrasion right knee    58y/o caucasian established female here for re-evaluation rash/abrasion right knee after stopping keflex  04/03/2024 and starting augmentin  875mg  suspected MRSA or drug resistant bacteria to keflex  as had increasing swelling and erythema and intermittent discharge.  Patient reported that evening pain in knee had decreased and by next day resolved and swelling and redness resolved today to area and no longer painful at all.  Denied discharge in previous 24 hours/fever/chills.      Review of Systems  Constitutional:  Negative for chills and fever.  Respiratory:  Negative for cough and wheezing.   Cardiovascular:  Negative for leg swelling.  Musculoskeletal:  Negative for joint pain and myalgias.  Neurological:  Negative for tingling, tremors, sensory change and weakness.  Psychiatric/Behavioral:  The patient does not have insomnia.       Objective:     BP (!) 145/94 (BP Location: Left Arm, Patient Position: Sitting, Cuff Size: Normal)   Pulse (!) 102   Temp 98.6 F (37 C) (Tympanic)   SpO2 96%    Physical Exam Vitals and nursing note reviewed.  Constitutional:      General: She is awake. She is not in acute distress.    Appearance: Normal appearance. She is well-developed and well-groomed. She is obese. She is not ill-appearing, toxic-appearing or diaphoretic.  HENT:     Head: Normocephalic and atraumatic.     Jaw: There is normal jaw occlusion.     Salivary Glands: Right salivary gland is not diffusely enlarged. Left salivary gland is not diffusely enlarged.     Right Ear: Hearing and external ear normal.     Left Ear: Hearing and external ear normal.     Nose: Nose normal. No congestion or rhinorrhea.     Mouth/Throat:     Lips: Pink. No lesions.      Mouth: Mucous membranes are moist.     Pharynx: Oropharynx is clear.  Eyes:     General: Lids are normal. Vision grossly intact. Gaze aligned appropriately. No scleral icterus.       Right eye: No discharge.        Left eye: No discharge.     Extraocular Movements: Extraocular movements intact.     Conjunctiva/sclera: Conjunctivae normal.     Pupils: Pupils are equal, round, and reactive to light.  Neck:     Trachea: Trachea normal.  Cardiovascular:     Rate and Rhythm: Normal rate and regular rhythm.     Pulses: Normal pulses.  Pulmonary:     Effort: Pulmonary effort is normal.     Breath sounds: Normal breath sounds and air entry. No stridor or transmitted upper airway sounds. No wheezing.     Comments: Spoke full sentences without difficulty; no cough observed in exam room Abdominal:     General: Abdomen is flat.     Palpations: Abdomen is soft.  Musculoskeletal:        General: No swelling, tenderness or signs of injury. Normal range of motion.     Cervical back: Normal range of motion and neck supple. No rigidity.     Right lower leg: No edema.     Left lower leg: No edema.  Lymphadenopathy:     Head:     Right  side of head: No submandibular or preauricular adenopathy.     Left side of head: No submandibular or preauricular adenopathy.     Cervical: No cervical adenopathy.     Right cervical: No superficial cervical adenopathy.    Left cervical: No superficial cervical adenopathy.  Skin:    General: Skin is warm and dry.     Capillary Refill: Capillary refill takes less than 2 seconds.     Coloration: Skin is not ashen, cyanotic, jaundiced, mottled, pale or sallow.     Findings: Abrasion present. No abscess, acne, bruising, burn, ecchymosis, erythema, signs of injury, laceration, petechiae, rash or wound. Rash is not crusting, nodular, purpuric, pustular, scaling, urticarial or vesicular.     Nails: There is no clubbing.          Comments: Scabbed abrasion nummular right  anterior knee distal to patella overlying patellar tendon no edema/TTP; brown scab dry  Neurological:     General: No focal deficit present.     Mental Status: She is alert and oriented to person, place, and time. Mental status is at baseline.     GCS: GCS eye subscore is 4. GCS verbal subscore is 5. GCS motor subscore is 6.     Cranial Nerves: Cranial nerves 2-12 are intact. No cranial nerve deficit, dysarthria or facial asymmetry.     Motor: Motor function is intact. No weakness, tremor, atrophy, abnormal muscle tone or seizure activity.     Coordination: Coordination is intact. Coordination normal.     Gait: Gait is intact. Gait normal.     Comments: In/out of chair and exam table on/off without difficulty; gait sure and steady in clinic; bilateral hand grasp equal 5/5  Psychiatric:        Attention and Perception: Attention and perception normal.        Mood and Affect: Mood and affect normal.        Speech: Speech normal.        Behavior: Behavior normal. Behavior is cooperative.        Thought Content: Thought content normal.        Cognition and Memory: Cognition and memory normal.        Judgment: Judgment normal.      No results found for any visits on 04/05/24.    The 10-year ASCVD risk score (Arnett DK, et al., 2019) is: 5.5%    Assessment & Plan:   Problem List Items Addressed This Visit   None Visit Diagnoses       Cellulitis of right knee    -  Primary     Essential hypertension         Finish 7 days augmentin  875mg  po BID dispensed 04/03/24 wash daily with soap allowing soaping water  to run over affected area do not scrub off scab or pick scab.  Cover with bandaid while at work to prevent clothes from rubbing off scab and remove bandage when at home.  May apply neosporin or triple antibiotic daily after washing with soap and water .  Follow up for re-evaluation if new symptoms prn.  Patient agreed with plan of care and had no further questions at this time.   Exitcare handout abrasion  Patient reported she hasn't been taking blood pressure medication at same time every day.  RN Thersia Flax discussed with patient best to take around the same time every day for best effects.  Exitcare handout managing hypertension.  Patient verbalized understanding and had no further questions at this time.  Return if symptoms worsen or fail to improve.    Richardine Chancy, NP

## 2024-06-12 ENCOUNTER — Telehealth: Payer: Self-pay | Admitting: Registered Nurse

## 2024-06-12 ENCOUNTER — Encounter: Payer: Self-pay | Admitting: Registered Nurse

## 2024-06-12 DIAGNOSIS — I10 Essential (primary) hypertension: Secondary | ICD-10-CM

## 2024-06-12 NOTE — Telephone Encounter (Signed)
 Patient requested [facility] refill hydrochlorothiazide  25mg  po daily #90 RF2 dispensed to patient today.  Last labs Component Ref Range & Units 9 mo ago Comments  Sodium 136 - 145 mmol/L 139   Potassium 3.5 - 5.1 mmol/L 4 NO VISIBLE HEMOLYSIS  Chloride 98 - 107 mmol/L 107   CO2 21 - 31 mmol/L 25   Anion Gap 6 - 14 mmol/L 7   Glucose, Random 70 - 99 mg/dL 82   Blood Urea Nitrogen (BUN) 7 - 25 mg/dL 13   Creatinine 9.39 - 1.20 mg/dL 9.31   eGFR >40 fO/fpw/8.26f7 >90 GFR estimated by CKD-EPI equations(NKF 2021).  Recommend confirmation of Cr-based eGFR by using Cys-based eGFR and other filtration markers (if applicable) in complex cases and clinical decision-making, as needed.  Albumin 3.5 - 5.7 g/dL 4.5   Total Protein 6.4 - 8.9 g/dL 7   Bilirubin, Total 0.3 - 1.0 mg/dL 1.1 High    Alkaline Phosphatase (ALP) 34 - 104 U/L 58   Aspartate Aminotransferase (AST) 13 - 39 U/L 22   Alanine Aminotransferase (ALT) 7 - 52 U/L 10   Calcium 8.6 - 10.3 mg/dL 89.6   BUN/Creatinine Ratio  Creatinine is normal, ratio is not clinically indicated.  Resulting Agency [facility](CLIA# [id number])   Specimen Collected: 09/15/23 13:14   Performed by: [facility](CLIA# [id number]) Last Resulted: 09/15/23 20:17  Received From: [facility]  Result Received: 11/29/23 12:25    Patient reported home BP 120-130/70-80  Feeling well denied concerns or headache/dizziness/muscle cramps.  Next labs due Oct 2025 with pcm.  Patient asked if Be Well requirements complete and notified her yes signed all paperwork 01/31/24 and [facility] form given to HR Tonya.

## 2024-07-23 ENCOUNTER — Ambulatory Visit
Admission: RE | Admit: 2024-07-23 | Discharge: 2024-07-23 | Disposition: A | Discharge status: Home / Self Care | Source: Ambulatory Visit | Attending: Internal Medicine | Admitting: Internal Medicine

## 2024-07-23 DIAGNOSIS — N6320 Unspecified lump in the left breast, unspecified quadrant: Secondary | ICD-10-CM

## 2024-09-18 ENCOUNTER — Encounter: Payer: Self-pay | Admitting: Registered Nurse

## 2024-09-18 ENCOUNTER — Telehealth: Payer: Self-pay | Admitting: Registered Nurse

## 2024-09-18 DIAGNOSIS — Z0000 Encounter for general adult medical examination without abnormal findings: Secondary | ICD-10-CM

## 2024-09-18 DIAGNOSIS — I10 Essential (primary) hypertension: Secondary | ICD-10-CM

## 2024-09-18 NOTE — Telephone Encounter (Signed)
 Patient last filled 06/12/24 90 tabs  last labs 2024 Oct Component Ref Range & Units 9 mo ago Comments  Sodium 136 - 145 mmol/L 139    Potassium 3.5 - 5.1 mmol/L 4 NO VISIBLE HEMOLYSIS  Chloride 98 - 107 mmol/L 107    CO2 21 - 31 mmol/L 25    Anion Gap 6 - 14 mmol/L 7    Glucose, Random 70 - 99 mg/dL 82    Blood Urea Nitrogen (BUN) 7 - 25 mg/dL 13    Creatinine 9.39 - 1.20 mg/dL 9.31    eGFR >40 fO/fpw/8.26f7 >90 GFR estimated by CKD-EPI equations(NKF 2021).  Recommend confirmation of Cr-based eGFR by using Cys-based eGFR and other filtration markers (if applicable) in complex cases and clinical decision-making, as needed.  Albumin 3.5 - 5.7 g/dL 4.5    Total Protein 6.4 - 8.9 g/dL 7    Bilirubin, Total 0.3 - 1.0 mg/dL 1.1 High     Alkaline Phosphatase (ALP) 34 - 104 U/L 58    Aspartate Aminotransferase (AST) 13 - 39 U/L 22    Alanine Aminotransferase (ALT) 7 - 52 U/L 10    Calcium 8.6 - 10.3 mg/dL 89.6    BUN/Creatinine Ratio   Creatinine is normal, ratio is not clinically indicated.  Resulting Agency [facility] [place] [facility](CLIA# [id number])    Specimen Collected: 09/15/23 13:14    Performed by: [facility](CLIA# [id number]) Last Resulted: 09/15/23 20:17  Received From: [facility]  Result Received: 11/29/23 12:25     Last BP 136/81 08/22/24 sick visit  Dispensed 90 tabs hydrochlorothiazide  25mg  po daily to patient today from [facility].  Labs due now.  Patient notified to schedule with RN Karene fasting.

## 2024-09-25 ENCOUNTER — Encounter: Payer: Self-pay | Admitting: Obstetrics & Gynecology

## 2024-09-26 ENCOUNTER — Inpatient Hospital Stay: Attending: Obstetrics & Gynecology | Admitting: Obstetrics & Gynecology

## 2024-09-26 VITALS — BP 141/79 | HR 86 | Temp 98.0°F | Resp 19 | Wt 250.6 lb

## 2024-09-26 DIAGNOSIS — Z8542 Personal history of malignant neoplasm of other parts of uterus: Secondary | ICD-10-CM | POA: Insufficient documentation

## 2024-09-26 DIAGNOSIS — C541 Malignant neoplasm of endometrium: Secondary | ICD-10-CM

## 2024-09-26 NOTE — Progress Notes (Unsigned)
 Follow Up Note: Gyn-Onc  Sara Cook 58 y.o. female  CC: F/U visit   HPI: The oncology history was reviewed.  Interval History:  She denies any vaginal bleeding, abdominal/pelvic pain, cough, lethargy or increasing abdominal girth. Discussed the use of AI scribe software for clinical note transcription with the patient, who gave verbal consent to proceed. She is experiencing difficulty losing weight despite dietary changes, such as reducing bread and carbohydrate intake and avoiding soda. She is frustrated with her inability to lose weight despite these efforts.    Review of Systems  Review of Systems  Constitutional:  Negative for malaise/fatigue and weight loss.  Respiratory:  Negative for shortness of breath and wheezing.   Cardiovascular:  Negative for chest pain and leg swelling.  Gastrointestinal:  Negative for abdominal pain, blood in stool, constipation, nausea and vomiting.  Genitourinary:  Negative for dysuria, frequency, hematuria and urgency.  Musculoskeletal:  Negative for joint pain and myalgias.  Neurological:  Negative for weakness.  Psychiatric/Behavioral:  Negative for depression. The patient does not have insomnia.    Current medications, allergy, social history, past surgical history, past medical history, family history were all reviewed.   Vitals:  BP (!) 141/79 (BP Location: Left Arm, Patient Position: Sitting)   Pulse 86   Temp 98 F (36.7 C)   Resp 19   Wt 250 lb 9.6 oz (113.7 kg)   SpO2 100%   BMI 38.10 kg/m    Physical Exam:  Physical Exam Exam conducted with a chaperone present.  Constitutional:      General: She is not in acute distress. Cardiovascular:     Rate and Rhythm: Normal rate and regular rhythm.  Pulmonary:     Effort: Pulmonary effort is normal.     Breath sounds: Normal breath sounds. No wheezing or rhonchi.  Abdominal:     Palpations: Abdomen is soft.     Tenderness: There is no abdominal tenderness. There is no right CVA  tenderness or left CVA tenderness.     Hernia: No hernia is present.  Genitourinary:    General: Normal vulva; left labia minora--yellowish white pedunculated nodule--< 3mm.     Urethra: No urethral lesion.     Vagina: No lesions. No vaginal bleeding. Scarring at apex Musculoskeletal:     Cervical back: Neck supple.     Right lower leg: No edema.     Left lower leg: No edema.  Lymphadenopathy:     Upper Body:     Right upper body: No supraclavicular adenopathy.     Left upper body: No supraclavicular adenopathy.     Lower Body: No right inguinal adenopathy. No left inguinal adenopathy.  Skin:    Findings: No rash.  Neurological:     Mental Status: She is oriented to person, place, and time.   Assessment/Plan:  Endometrial cancer [facility]) [name]  is a 58 y.o.  year old P2 with stage IB grade 1 endometrial cancer, MMR normal/MSI stable. S/p staging on 03/17/21. Negative symptom review, normal exam.  No evidence of recurrence    >continue follow-up every 6 months for 5 years in accordance with NCCN guidelines      I personally spent 25 minutes face-to-face and non-face-to-face in the care of this patient, which includes all pre, intra, and post visit time on the date of service.    [place], MD

## 2024-09-26 NOTE — Patient Instructions (Signed)
  VISIT SUMMARY: Today, you came in for your routine gynecological exam. You mentioned that you have not experienced any bleeding or pain since your last visit, and your energy levels are good. You also shared that you are having difficulty losing weight despite making dietary changes.  YOUR PLAN: -ROUTINE GYNECOLOGICAL EXAMINATION: Your routine gynecological examination was normal. It is important to continue these exams every six months to monitor your health.  -WEIGHT MANAGEMENT: You are having difficulty losing weight despite reducing bread and carbohydrate intake and avoiding soda. Weight management can be challenging, but continuing with healthy dietary habits and possibly incorporating more physical activity can help. We will continue to monitor this and provide support.  INSTRUCTIONS: Please continue with your gynecological follow-up every six months. Maintain your healthy dietary habits and consider adding more physical activity to your routine. If you have any concerns or need further support, do not hesitate to reach out. Return in 1 year.                      Contains text generated by Abridge.                                 Contains text generated by Abridge.

## 2024-09-26 NOTE — Assessment & Plan Note (Signed)
 [name]  is a 58 y.o.  year old P2 with stage IB grade 1 endometrial cancer, MMR normal/MSI stable. S/p staging on 03/17/21. Negative symptom review, normal exam.  No evidence of recurrence    >continue follow-up every 6 months for 5 years in accordance with NCCN guidelines

## 2024-09-28 ENCOUNTER — Encounter: Payer: Self-pay | Admitting: Obstetrics & Gynecology

## 2024-12-18 ENCOUNTER — Telehealth: Payer: Self-pay | Admitting: General Practice

## 2024-12-19 ENCOUNTER — Ambulatory Visit: Admitting: General Practice

## 2024-12-19 VITALS — BP 122/78 | HR 82 | Temp 98.5°F | Resp 18

## 2024-12-19 DIAGNOSIS — M79642 Pain in left hand: Secondary | ICD-10-CM | POA: Insufficient documentation

## 2024-12-19 NOTE — Progress Notes (Signed)
 Sara Cook presents to the clinic this morning with c/o left wrist pain, s/p a slip and fall on the ice last week. She reports losing her grip while getting out of the car, sliding on the ground and almost ending under the car. She tried to brace herself with her left hand and her wrist was injured. She also landed on her buttocks but did not report any injury in that area. She denies falling on or pain in her hips, ankles, legs, torso or head. She has been putting ice on her wrist for the last several days and taking Advil  for pain. She is requesting something to help stabilize her wrist.   VSS; upon review, Sara Cook's left wrist has edema compared to the Rt wrist. There are greenish yellow colored healing bruises on posterior and anterior areas. She can make a fist, wiggle fingers and has good capillary refill. She does not have full motion of her wrist when asked to move her left hand it up and down.   RN provided her a foam with aluminum stay 8 left wrist splint for stability. Splint adjusted for appropriate fit. Advised her to adjust the splint as needed for comfort. Also advised to take the splint off during breaks and lunch and continue putting ice on the site. She should also continue icing her wrist in the evenings before bedtime. Encouraged her to sit and/or sleep with her left wrist elevated on a pillow to continue decreasing the swelling. Requested Sara Cook revisit the clinic if her fingers or hand becomes numb, tingles, changes skin color or she has loss of feeling. Will follow up to assess her healing process.

## 2024-12-20 ENCOUNTER — Encounter: Payer: Self-pay | Admitting: Registered Nurse

## 2024-12-20 ENCOUNTER — Telehealth: Payer: Self-pay | Admitting: Registered Nurse

## 2024-12-20 DIAGNOSIS — I10 Essential (primary) hypertension: Secondary | ICD-10-CM

## 2024-12-20 DIAGNOSIS — Z0000 Encounter for general adult medical examination without abnormal findings: Secondary | ICD-10-CM

## 2024-12-21 NOTE — Telephone Encounter (Signed)
 Patient requested refill of hydrochlorothiazide  25mg  po daily.  Noted in epic PCM started patient on losartan hydrochlorothiazide  50/12.5mg  po daily discussed with patient her new medication she picks up from [facility] has 2 blood pressure medications in it including hydrochlorothiazide  to clarify with her Dr if she is to continue 25mg  po daily in addition to 12.5mg .  Asked patient to see RN Karene for BP recheck on Monday after taking only her losartan hydrochlorothiazide  combined pill over the weekend.    Patient also reported she fell at home on ice underneath her car and had fallen on outstretched left hand; affected hand/wrist/forearm bruised and swollen but improving as she saw RN Karene earlier this week and fitted for wrist splint and wearing at work this week stated ibuprofen  and tylenol  providing pain relief.  Discussed cryotherapy 15 minutes QID prn pain/swelling.  Patient seen in workcenter has full strength 5/5 equal bilateral fingers/hand/wrist/elbow/shoulders.  1-2+/4 nonpitting edema distal left forearm/wrist with healing yellow/blue ecchymosis lateral and medial forearm.  Full AROM all hand/arm joints equal bilaterally.  Negative ulnar/radius squeeze test and finkelsteins test.  Patient to follow up for re-evaluation if new or worsening symptoms or no gradual improvement in swelling/pain. Discussed contusion soft tissue and bone most likely but cannot rule out fracture by physical exam.  Patient stated if not healing or worsening she will follow up for evaluation with NP.  Continue wrist splint wear at work for 4 weeks.  Avoid impact to left hand.  Exitcare handouts on contusion/wrist strain to my chart.  Patient agreed with plan of care and had no further questions at this time.
